# Patient Record
Sex: Female | Born: 1980 | Race: Black or African American | Hispanic: No | Marital: Single | Smoking: Former smoker
Health system: Southern US, Community
[De-identification: ages and names within clinical notes are randomized; demographics above are authoritative.]

## PROBLEM LIST (undated history)

## (undated) ENCOUNTER — Emergency Department (HOSPITAL_BASED_OUTPATIENT_CLINIC_OR_DEPARTMENT_OTHER): Admission: EM | Payer: Medicaid Other

## (undated) DIAGNOSIS — J45909 Unspecified asthma, uncomplicated: Secondary | ICD-10-CM

## (undated) DIAGNOSIS — I10 Essential (primary) hypertension: Secondary | ICD-10-CM

## (undated) HISTORY — PX: OTHER SURGICAL HISTORY: SHX169

---

## 2016-10-18 ENCOUNTER — Ambulatory Visit
Admission: EM | Admit: 2016-10-18 | Discharge: 2016-10-18 | Disposition: A | Discharge status: Home / Self Care | Payer: Self-pay | Attending: Family Medicine | Admitting: Family Medicine

## 2016-10-18 ENCOUNTER — Encounter: Payer: Self-pay | Admitting: Emergency Medicine

## 2016-10-18 DIAGNOSIS — J4520 Mild intermittent asthma, uncomplicated: Secondary | ICD-10-CM

## 2016-10-18 DIAGNOSIS — N3001 Acute cystitis with hematuria: Secondary | ICD-10-CM

## 2016-10-18 HISTORY — DX: Essential (primary) hypertension: I10

## 2016-10-18 HISTORY — DX: Unspecified asthma, uncomplicated: J45.909

## 2016-10-18 LAB — URINALYSIS, COMPLETE (UACMP) WITH MICROSCOPIC
Bilirubin Urine: NEGATIVE
GLUCOSE, UA: NEGATIVE mg/dL
KETONES UR: NEGATIVE mg/dL
NITRITE: NEGATIVE
PH: 6.5 (ref 5.0–8.0)
Protein, ur: 100 mg/dL — AB
Specific Gravity, Urine: 1.025 (ref 1.005–1.030)

## 2016-10-18 MED ORDER — FLUCONAZOLE 150 MG PO TABS: ORAL_TABLET | ORAL | 0 refills | Status: DC

## 2016-10-18 MED ORDER — CEPHALEXIN 500 MG PO CAPS: 500.0000 mg | ORAL_CAPSULE | Freq: Two times a day (BID) | ORAL | 0 refills | Status: DC

## 2016-10-18 MED ORDER — ALBUTEROL SULFATE 1.25 MG/3ML IN NEBU: 1.0000 | INHALATION_SOLUTION | Freq: Four times a day (QID) | RESPIRATORY_TRACT | 0 refills | Status: DC | PRN

## 2016-10-18 MED ORDER — ALBUTEROL SULFATE HFA 108 (90 BASE) MCG/ACT IN AERS: 1.0000 | INHALATION_SPRAY | Freq: Four times a day (QID) | RESPIRATORY_TRACT | 0 refills | Status: DC | PRN

## 2016-10-18 NOTE — ED Triage Notes (Signed)
Patient states she needs albuterol for her nebulizer and she thinks she may have a UTI

## 2016-10-18 NOTE — ED Provider Notes (Signed)
[facility]    CSN: [id number] Arrival date & time: 10/18/16  1018     History   Chief Complaint Chief Complaint  Patient presents with  . Asthma    HPI Roberta Soto is a 36 y.o. female.   HPI  This a 36 year old female who has a history of asthma. She states that she has run out of her albuterol nebulizer and albuterol inhaler. In addition she states that she thinks that she may have a UTI. She is currently "looking" for her primary care physician. He says that her asthma is typically worse at nighttime when it gets hot. She has mild wheezing currently. She has had no fever or chills. Does not complain of any back pain or flank pain. She does have frequency urgency and dysuria. She has not noticed any vaginal discharge or irritation.         Past Medical History:  Diagnosis Date  . Asthma   . Hypertension     There are no active problems to display for this patient.   Past Surgical History:  Procedure Laterality Date  . c-section     X4    OB History    No data available       Home Medications    Prior to Admission medications   Medication Sig Start Date End Date Taking? Authorizing Provider  escitalopram (LEXAPRO) 10 MG tablet Take 10 mg by mouth daily.   Yes [provider]  albuterol (ACCUNEB) 1.25 MG/3ML nebulizer solution Take 3 mLs (1.25 mg total) by nebulization every 6 (six) hours as needed for wheezing. 10/18/16   Lutricia Feil, PA-C  albuterol (PROVENTIL HFA;VENTOLIN HFA) 108 (90 Base) MCG/ACT inhaler Inhale 1-2 puffs into the lungs every 6 (six) hours as needed for wheezing or shortness of breath. Use with spacer 10/18/16   Lutricia Feil, PA-C  cephALEXin (KEFLEX) 500 MG capsule Take 1 capsule (500 mg total) by mouth 2 (two) times daily. 10/18/16   Lutricia Feil, PA-C  fluconazole (DIFLUCAN) 150 MG tablet Take one tab for symptoms of yeast infection. Repeat x 1 in 72 hours. 10/18/16   Lutricia Feil, PA-C    Family  History Family History  Problem Relation Age of Onset  . Cancer Mother   . Hypertension Mother   . Cancer Brother   . Healthy Father     Social History Social History  Substance Use Topics  . Smoking status: Never Smoker  . Smokeless tobacco: Never Used  . Alcohol use No     Allergies   Fruit & vegetable daily [nutritional supplements]   Review of Systems Review of Systems  Constitutional: Positive for activity change. Negative for appetite change, chills and fever.  Respiratory: Positive for shortness of breath and wheezing.   Genitourinary: Positive for frequency and urgency. Negative for vaginal discharge and vaginal pain.  Musculoskeletal: Negative for back pain.  All other systems reviewed and are negative.    Physical Exam Triage Vital Signs ED Triage Vitals  Enc Vitals Group     BP 10/18/16 1243 (!) 131/95     Pulse Rate 10/18/16 1243 78     Resp 10/18/16 1243 18     Temp 10/18/16 1243 98.3 F (36.8 C)     Temp Source 10/18/16 1243 Oral     SpO2 10/18/16 1243 99 %     Weight 10/18/16 1238 172 lb (78 kg)     Height 10/18/16 1238  (1.702 m)  Head Circumference --      Peak Flow --      Pain Score 10/18/16 1239 5     Pain Loc --      Pain Edu? --      Excl. in GC? --    No data found.   Updated Vital Signs BP (!) 131/95 (BP Location: Left Arm)   Pulse 78   Temp 98.3 F (36.8 C) (Oral)   Resp 18   Ht 5\' 7"  (1.702 m)   Wt 172 lb (78 kg)   LMP 09/30/2016 (Exact Date)   SpO2 99%   BMI 26.94 kg/m   Visual Acuity Right Eye Distance:   Left Eye Distance:   Bilateral Distance:    Right Eye Near:   Left Eye Near:    Bilateral Near:     Physical Exam  Constitutional: She is oriented to person, place, and time. She appears well-developed and well-nourished. No distress.  HENT:  Head: Normocephalic.  Eyes: Pupils are equal, round, and reactive to light.  Neck: Normal range of motion.  Pulmonary/Chest: Effort normal. She has wheezes.    Using is mild expiratory not interfering with air movement.  Abdominal: Soft. Bowel sounds are normal.  No CVA tenderness  Musculoskeletal: Normal range of motion.  Neurological: She is alert and oriented to person, place, and time.  Skin: Skin is warm and dry. She is not diaphoretic.  Psychiatric: She has a normal mood and affect. Her behavior is normal. Judgment and thought content normal.  Nursing note and vitals reviewed.    UC Treatments / Results  Labs (all labs ordered are listed, but only abnormal results are displayed) Labs Reviewed  URINALYSIS, COMPLETE (UACMP) WITH MICROSCOPIC - Abnormal; Notable for the following:       Result Value   APPearance CLOUDY (*)    Hgb urine dipstick MODERATE (*)    Protein, ur 100 (*)    Leukocytes, UA MODERATE (*)    Squamous Epithelial / LPF 0-5 (*)    Bacteria, UA FEW (*)    All other components within normal limits  URINE CULTURE    EKG  EKG Interpretation None       Radiology No results found.  Procedures Procedures (including critical care time)  Medications Ordered in UC Medications - No data to display   Initial Impression / Assessment and Plan / UC Course  I have reviewed the triage vital signs and the nursing notes.  Pertinent labs & imaging results that were available during my care of the patient were reviewed by me and considered in my medical decision making (see chart for details).     Plan: 1. Test/x-ray results and diagnosis reviewed with patient 2. rx as per orders; risks, benefits, potential side effects reviewed with patient 3. Recommend supportive treatment with drinking plenty of fluids. I have refilled her nebulizer and albuterol inhalers. She should find a primary care physician that we'll monitor her asthma. At the present time it appears to be mild intermittent. Culture sensitivities of the urine should be available in 48 hours. I'll treat her empirically with cephalexin. I've also given her  Diflucan for the budding yeast in the urine. 4. F/u prn if symptoms worsen or don't improve   Final Clinical Impressions(s) / UC Diagnoses   Final diagnoses:  Mild intermittent asthma without complication  Acute cystitis with hematuria    New Prescriptions Discharge Medication List as of 10/18/2016  1:38 PM    START taking these  medications   Details  albuterol (PROVENTIL HFA;VENTOLIN HFA) 108 (90 Base) MCG/ACT inhaler Inhale 1-2 puffs into the lungs every 6 (six) hours as needed for wheezing or shortness of breath. Use with spacer, Starting Fri 10/18/2016, Normal    cephALEXin (KEFLEX) 500 MG capsule Take 1 capsule (500 mg total) by mouth 2 (two) times daily., Starting Fri 10/18/2016, Normal    fluconazole (DIFLUCAN) 150 MG tablet Take one tab for symptoms of yeast infection. Repeat x 1 in 72 hours., Normal         Controlled Substance Prescriptions [place] Controlled Substance Registry consulted? Not Applicable   Lutricia Feil, PA-C 10/18/16 1344

## 2016-10-21 LAB — URINE CULTURE: Culture: >=100000 — AB

## 2018-06-23 ENCOUNTER — Encounter: Payer: Self-pay | Admitting: Emergency Medicine

## 2018-06-23 ENCOUNTER — Other Ambulatory Visit: Payer: Self-pay

## 2018-06-23 ENCOUNTER — Ambulatory Visit
Admission: EM | Admit: 2018-06-23 | Discharge: 2018-06-23 | Disposition: A | Discharge status: Home / Self Care | Payer: Self-pay | Attending: Family Medicine | Admitting: Family Medicine

## 2018-06-23 DIAGNOSIS — J45998 Other asthma: Secondary | ICD-10-CM

## 2018-06-23 DIAGNOSIS — R05 Cough: Secondary | ICD-10-CM

## 2018-06-23 DIAGNOSIS — J4541 Moderate persistent asthma with (acute) exacerbation: Secondary | ICD-10-CM

## 2018-06-23 DIAGNOSIS — R053 Chronic cough: Secondary | ICD-10-CM

## 2018-06-23 DIAGNOSIS — I10 Essential (primary) hypertension: Secondary | ICD-10-CM

## 2018-06-23 MED ORDER — ALBUTEROL SULFATE HFA 108 (90 BASE) MCG/ACT IN AERS: 1.0000 | INHALATION_SPRAY | Freq: Four times a day (QID) | RESPIRATORY_TRACT | 11 refills | Status: DC | PRN

## 2018-06-23 MED ORDER — BUDESONIDE-FORMOTEROL FUMARATE 80-4.5 MCG/ACT IN AERO: 2.0000 | INHALATION_SPRAY | Freq: Two times a day (BID) | RESPIRATORY_TRACT | 12 refills | Status: DC

## 2018-06-23 MED ORDER — BENZONATATE 200 MG PO CAPS: 200.0000 mg | ORAL_CAPSULE | Freq: Three times a day (TID) | ORAL | 0 refills | Status: DC | PRN

## 2018-06-23 MED ORDER — PREDNISONE 50 MG PO TABS: ORAL_TABLET | ORAL | 0 refills | Status: DC

## 2018-06-23 NOTE — ED Provider Notes (Signed)
[facility]    CSN: [id number] Arrival date & time: 06/23/18  1050   History   Chief Complaint Chief Complaint  Patient presents with  . Cough  . Shortness of Breath   HPI  38 year old female presents with cough, wheezing, shortness of breath.  Patient reports that she has had an ongoing cough for greater than 6 months.  Worsened as of late.  She has been more short of breath since Friday.  Cough is productive.  She reports that she has not had any recent travel.  No fever.  She states that she is using her inhaler greater than 10 times a day.  She feels like work may exacerbate her condition due to dust.  She has used her inhaler without resolution.  Patient has recently missed work due to her cough.  No other associated symptoms.  No other complaints.   History reviewed as below. Past Medical History:  Diagnosis Date  . Asthma   . Hypertension    Past Surgical History:  Procedure Laterality Date  . c-section     X4   OB History   No obstetric history on file.    Home Medications    Prior to Admission medications   Medication Sig Start Date End Date Taking? Authorizing Provider  albuterol (ACCUNEB) 1.25 MG/3ML nebulizer solution Take 3 mLs (1.25 mg total) by nebulization every 6 (six) hours as needed for wheezing. 10/18/16  Yes Lutricia Feil, PA-C  escitalopram (LEXAPRO) 10 MG tablet Take 10 mg by mouth daily.   Yes [provider]  albuterol (VENTOLIN HFA) 108 (90 Base) MCG/ACT inhaler Inhale 1-2 puffs into the lungs every 6 (six) hours as needed for wheezing or shortness of breath. Use with spacer 06/23/18   Nao Linz, Dorie Rank G, DO  benzonatate (TESSALON) 200 MG capsule Take 1 capsule (200 mg total) by mouth 3 (three) times daily as needed for cough. 06/23/18   Tommie Sams, DO  budesonide-formoterol (SYMBICORT) 80-4.5 MCG/ACT inhaler Inhale 2 puffs into the lungs 2 (two) times a day. 06/23/18   Tommie Sams, DO  predniSONE (DELTASONE) 50 MG tablet 1  tablet daily x 5 days 06/23/18   Tommie Sams, DO    Family History Family History  Problem Relation Age of Onset  . Cancer Mother   . Hypertension Mother   . Cancer Brother   . Healthy Father     Social History Social History   Tobacco Use  . Smoking status: Never Smoker  . Smokeless tobacco: Never Used  Substance Use Topics  . Alcohol use: No  . Drug use: Never     Allergies   Fruit & vegetable daily [nutritional supplements]   Review of Systems Review of Systems  Constitutional: Negative for fever.  Respiratory: Positive for cough and shortness of breath.    Physical Exam Triage Vital Signs ED Triage Vitals  Enc Vitals Group     BP 06/23/18 1126 (!) 132/95     Pulse Rate 06/23/18 1126 70     Resp 06/23/18 1126 16     Temp 06/23/18 1126 98.2 F (36.8 C)     Temp Source 06/23/18 1126 Oral     SpO2 06/23/18 1126 99 %     Weight 06/23/18 1122 200 lb (90.7 kg)     Height 06/23/18 1122 5\' 7"  (1.702 m)     Head Circumference --      Peak Flow --      Pain Score 06/23/18  1122 0     Pain Loc --      Pain Edu? --      Excl. in GC? --    Updated Vital Signs BP (!) 132/95 (BP Location: Left Arm)   Pulse 70   Temp 98.2 F (36.8 C) (Oral)   Resp 16   Ht 5\' 7"  (1.702 m)   Wt 90.7 kg   LMP 06/03/2018 (Exact Date)   SpO2 99%   BMI 31.32 kg/m   Visual Acuity Right Eye Distance:   Left Eye Distance:   Bilateral Distance:    Right Eye Near:   Left Eye Near:    Bilateral Near:     Physical Exam Vitals signs and nursing note reviewed.  Constitutional:      General: She is not in acute distress.    Appearance: Normal appearance.  HENT:     Head: Normocephalic and atraumatic.  Eyes:     General:        Right eye: No discharge.        Left eye: No discharge.     Conjunctiva/sclera: Conjunctivae normal.  Cardiovascular:     Rate and Rhythm: Normal rate and regular rhythm.  Pulmonary:     Effort: Pulmonary effort is normal.     Comments: Diffuse  expiratory wheezing. Neurological:     Mental Status: She is alert.  Psychiatric:        Mood and Affect: Mood normal.        Behavior: Behavior normal.    UC Treatments / Results  Labs (all labs ordered are listed, but only abnormal results are displayed) Labs Reviewed - No data to display  EKG None  Radiology No results found.  Procedures Procedures (including critical care time)  Medications Ordered in UC Medications - No data to display  Initial Impression / Assessment and Plan / UC Course  I have reviewed the triage vital signs and the nursing notes.  Pertinent labs & imaging results that were available during my care of the patient were reviewed by me and considered in my medical decision making (see chart for details).    38 year old female presents with uncontrolled asthma.  Current exacerbation.  Ongoing chronic cough.  Treating with prednisone.  Tessalon Perles for cough.  Starting on controller medication: Symbicort.  Albuterol as needed.  Work note given.  Final Clinical Impressions(s) / UC Diagnoses   Final diagnoses:  Moderate persistent asthma with exacerbation  Uncontrolled persistent asthma  Chronic cough     Discharge Instructions     Medications as prescribed.  Take care  Dr. Adriana Simasook    ED Prescriptions    Medication Sig Dispense Auth. Provider   albuterol (VENTOLIN HFA) 108 (90 Base) MCG/ACT inhaler Inhale 1-2 puffs into the lungs every 6 (six) hours as needed for wheezing or shortness of breath. Use with spacer 2 Inhaler Jsean Taussig G, DO   budesonide-formoterol (SYMBICORT) 80-4.5 MCG/ACT inhaler Inhale 2 puffs into the lungs 2 (two) times a day. 1 Inhaler Shandora Koogler G, DO   predniSONE (DELTASONE) 50 MG tablet 1 tablet daily x 5 days 5 tablet Tasheba Henson G, DO   benzonatate (TESSALON) 200 MG capsule Take 1 capsule (200 mg total) by mouth 3 (three) times daily as needed for cough. 30 capsule Tommie Samsook, Shaye Lagace G, DO     Controlled Substance  Prescriptions [place] Controlled Substance Registry consulted? Not Applicable   Tommie SamsCook, Liliani Bobo G, DO 06/23/18 1205

## 2018-06-23 NOTE — Discharge Instructions (Addendum)
Medications as prescribed. ° °Take care ° °Dr. Denario Bagot  °

## 2018-06-23 NOTE — ED Triage Notes (Signed)
Patient c/o cough for over a month.  Patient also reports SOB that started last Friday.  Patient states that she has asthma. Patient denies fevers.

## 2018-09-20 ENCOUNTER — Ambulatory Visit
Admission: EM | Admit: 2018-09-20 | Discharge: 2018-09-20 | Disposition: A | Discharge status: Home / Self Care | Payer: Self-pay | Attending: Family Medicine | Admitting: Family Medicine

## 2018-09-20 ENCOUNTER — Other Ambulatory Visit: Payer: Self-pay

## 2018-09-20 DIAGNOSIS — R112 Nausea with vomiting, unspecified: Secondary | ICD-10-CM

## 2018-09-20 LAB — COMPREHENSIVE METABOLIC PANEL
ALT: 19 U/L (ref 0–44)
AST: 25 U/L (ref 15–41)
Albumin: 4.5 g/dL (ref 3.5–5.0)
Alkaline Phosphatase: 66 U/L (ref 38–126)
Anion gap: 10 (ref 5–15)
BUN: 9 mg/dL (ref 6–20)
CO2: 21 mmol/L — ABNORMAL LOW (ref 22–32)
Calcium: 9.1 mg/dL (ref 8.9–10.3)
Chloride: 108 mmol/L (ref 98–111)
Creatinine, Ser: 0.95 mg/dL (ref 0.44–1.00)
GFR calc Af Amer: >60 mL/min (ref >60)
GFR calc non Af Amer: >60 mL/min (ref >60)
Glucose, Bld: 105 mg/dL — ABNORMAL HIGH (ref 70–99)
Potassium: 3.4 mmol/L — ABNORMAL LOW (ref 3.5–5.1)
Sodium: 139 mmol/L (ref 135–145)
Total Bilirubin: 0.9 mg/dL (ref 0.3–1.2)
Total Protein: 7.6 g/dL (ref 6.5–8.1)

## 2018-09-20 LAB — URINALYSIS, COMPLETE (UACMP) WITH MICROSCOPIC
Glucose, UA: NEGATIVE mg/dL
Ketones, ur: 15 mg/dL — AB
Leukocytes,Ua: NEGATIVE
Nitrite: NEGATIVE
Protein, ur: 30 mg/dL — AB
Specific Gravity, Urine: >1.03 — ABNORMAL HIGH (ref 1.005–1.030)
pH: 5.5 (ref 5.0–8.0)

## 2018-09-20 LAB — PREGNANCY, URINE: Preg Test, Ur: NEGATIVE

## 2018-09-20 MED ORDER — PANTOPRAZOLE SODIUM 40 MG PO TBEC: 40.0000 mg | DELAYED_RELEASE_TABLET | Freq: Every day | ORAL | 0 refills | Status: DC

## 2018-09-20 MED ORDER — ONDANSETRON HCL 4 MG PO TABS: 4.0000 mg | ORAL_TABLET | Freq: Three times a day (TID) | ORAL | 0 refills | Status: DC | PRN

## 2018-09-20 NOTE — ED Triage Notes (Addendum)
Pt states she was seen 2 weeks ago at [facility] for food poisoning and still having symptoms. Having nausea and vomiting foam only in the morning. States she is not pregnant her tubes are tied. Thinks maybe it's acid reflux. But has stopped taking her lexapro since she has been sick and did not titrate off it.

## 2018-09-20 NOTE — ED Provider Notes (Signed)
[facility]    CSN: [id number] Arrival date & time: 09/20/18  1026      History   Chief Complaint Chief Complaint  Patient presents with  . Abdominal Pain   HPI  38 year old female presents with nausea, and vomiting.  She also reports some associated abdominal pain.  Patient was seen at another urgent care on 7/29.  Patient states that this was thought to be viral/food poisoning.  She was treated with Zofran.  She states that since that time she continues to have difficulty.  Reports nausea and emesis in the morning.  She states that she still not eating normally.  She is worried that if she eats a normal meal she will improve up.  Denies abdominal pain at this time.  No fever.  She is tolerating fluids.  Denies possibility of being pregnant as she had a tubal ligation.  Worse in the a.m.  Better throughout the day.  No other associated symptoms.  No other complaints.  PMH, Surgical Hx, Family Hx, Social History reviewed and updated as below.  Past Medical History:  Diagnosis Date  . Asthma   . Hypertension    Past Surgical History:  Procedure Laterality Date  . c-section     X4  Tubal ligation  OB History   No obstetric history on file.     Home Medications    Prior to Admission medications   Medication Sig Start Date End Date Taking? Authorizing Provider  albuterol (ACCUNEB) 1.25 MG/3ML nebulizer solution Take 3 mLs (1.25 mg total) by nebulization every 6 (six) hours as needed for wheezing. 10/18/16   Lorin Picket, PA-C  albuterol (VENTOLIN HFA) 108 (90 Base) MCG/ACT inhaler Inhale 1-2 puffs into the lungs every 6 (six) hours as needed for wheezing or shortness of breath. Use with spacer 06/23/18   Dyasia Firestine, Michael Boston G, DO  benzonatate (TESSALON) 200 MG capsule Take 1 capsule (200 mg total) by mouth 3 (three) times daily as needed for cough. 06/23/18   Coral Spikes, DO  budesonide-formoterol (SYMBICORT) 80-4.5 MCG/ACT inhaler Inhale 2 puffs into the lungs 2 (two)  times a day. 06/23/18   Coral Spikes, DO  escitalopram (LEXAPRO) 10 MG tablet Take 10 mg by mouth daily.    [provider]  ondansetron (ZOFRAN) 4 MG tablet Take 1 tablet (4 mg total) by mouth every 8 (eight) hours as needed for nausea or vomiting. 09/20/18   Coral Spikes, DO  pantoprazole (PROTONIX) 40 MG tablet Take 1 tablet (40 mg total) by mouth daily. 09/20/18   Coral Spikes, DO  predniSONE (DELTASONE) 50 MG tablet 1 tablet daily x 5 days 06/23/18   Coral Spikes, DO    Family History Family History  Problem Relation Age of Onset  . Cancer Mother   . Hypertension Mother   . Cancer Brother   . Healthy Father     Social History Social History   Tobacco Use  . Smoking status: Never Smoker  . Smokeless tobacco: Never Used  Substance Use Topics  . Alcohol use: No  . Drug use: Never     Allergies   Fruit & vegetable daily [nutritional supplements] and Latex   Review of Systems Review of Systems  Constitutional: Positive for appetite change. Negative for fever.  Gastrointestinal: Positive for abdominal pain, nausea and vomiting.   Physical Exam Triage Vital Signs ED Triage Vitals [09/20/18 1102]  Enc Vitals Group     BP 116/87  Pulse Rate 88     Resp 18     Temp 98 F (36.7 C)     Temp Source Oral     SpO2 99 %     Weight 206 lb (93.4 kg)     Height      Head Circumference      Peak Flow      Pain Score 0     Pain Loc      Pain Edu?      Excl. in GC?    Updated Vital Signs BP 116/87 (BP Location: Left Arm)   Pulse 88   Temp 98 F (36.7 C) (Oral)   Resp 18   Wt 93.4 kg   LMP  (Exact Date) Comment: tubal ligation  SpO2 99%   BMI 32.26 kg/m   Visual Acuity Right Eye Distance:   Left Eye Distance:   Bilateral Distance:    Right Eye Near:   Left Eye Near:    Bilateral Near:     Physical Exam Vitals signs and nursing note reviewed.  Constitutional:      General: She is not in acute distress.    Appearance: She is well-developed. She  is obese.  HENT:     Head: Normocephalic and atraumatic.  Eyes:     General:        Right eye: No discharge.        Left eye: No discharge.     Conjunctiva/sclera: Conjunctivae normal.  Cardiovascular:     Rate and Rhythm: Normal rate and regular rhythm.  Pulmonary:     Effort: Pulmonary effort is normal.     Breath sounds: Normal breath sounds. No wheezing, rhonchi or rales.  Abdominal:     General: There is no distension.     Palpations: Abdomen is soft.     Tenderness: There is no abdominal tenderness.  Neurological:     Mental Status: She is alert.  Psychiatric:        Mood and Affect: Mood normal.        Behavior: Behavior normal.    UC Treatments / Results  Labs (all labs ordered are listed, but only abnormal results are displayed) Labs Reviewed  URINALYSIS, COMPLETE (UACMP) WITH MICROSCOPIC - Abnormal; Notable for the following components:      Result Value   Color, Urine AMBER (*)    APPearance CLOUDY (*)    Specific Gravity, Urine >1.030 (*)    Hgb urine dipstick MODERATE (*)    Bilirubin Urine MODERATE (*)    Ketones, ur 15 (*)    Protein, ur 30 (*)    Bacteria, UA RARE (*)    All other components within normal limits  COMPREHENSIVE METABOLIC PANEL - Abnormal; Notable for the following components:   Potassium 3.4 (*)    CO2 21 (*)    Glucose, Bld 105 (*)    All other components within normal limits  PREGNANCY, URINE    EKG   Radiology No results found.  Procedures Procedures (including critical care time)  Medications Ordered in UC Medications - No data to display  Initial Impression / Assessment and Plan / UC Course  I have reviewed the triage vital signs and the nursing notes.  Pertinent labs & imaging results that were available during my care of the patient were reviewed by me and considered in my medical decision making (see chart for details).    38 year old female presents with nausea and vomiting.  Labs unremarkable.  Pregnancy  test  negative.  Treating with Protonix and Zofran.  Supportive care.  Final Clinical Impressions(s) / UC Diagnoses   Final diagnoses:  Non-intractable vomiting with nausea, unspecified vomiting type     Discharge Instructions     Medication as directed.  Try eating some of your normal foods.  Take care  Dr. Adriana Simasook    ED Prescriptions    Medication Sig Dispense Auth. Provider   ondansetron (ZOFRAN) 4 MG tablet Take 1 tablet (4 mg total) by mouth every 8 (eight) hours as needed for nausea or vomiting. 20 tablet Akiva Brassfield G, DO   pantoprazole (PROTONIX) 40 MG tablet Take 1 tablet (40 mg total) by mouth daily. 30 tablet Tommie Samsook, Hollin Crewe G, DO     Controlled Substance Prescriptions [place] Controlled Substance Registry consulted? Not Applicable   Tommie SamsCook, Haely Leyland G, DO 09/20/18 1216

## 2018-09-20 NOTE — Discharge Instructions (Signed)
Medication as directed.  Try eating some of your normal foods.  Take care  Dr. Lacinda Axon

## 2018-10-28 ENCOUNTER — Ambulatory Visit
Admission: EM | Admit: 2018-10-28 | Discharge: 2018-10-28 | Disposition: A | Discharge status: Home / Self Care | Payer: Self-pay | Attending: Family Medicine | Admitting: Family Medicine

## 2018-10-28 ENCOUNTER — Other Ambulatory Visit: Payer: Self-pay

## 2018-10-28 ENCOUNTER — Encounter: Payer: Self-pay | Admitting: Emergency Medicine

## 2018-10-28 DIAGNOSIS — J4541 Moderate persistent asthma with (acute) exacerbation: Secondary | ICD-10-CM

## 2018-10-28 MED ORDER — PREDNISONE 50 MG PO TABS: ORAL_TABLET | ORAL | 0 refills | Status: DC

## 2018-10-28 NOTE — ED Provider Notes (Signed)
[facility]    CSN: [id number] Arrival date & time: 10/28/18  1059  History   Chief Complaint Chief Complaint  Patient presents with  . Cough  . Fatigue   HPI  38 year old female presents with the above complaints.  Patient reports that she has had cough and fatigue for the past 2 to 3 days.  No pain.  No sore throat.  Patient endorses compliance with her inhalers.  Last albuterol use was last night.  Patient endorses wheezing.  She states that her son has recently had a cold.  She believes that this is the inciting factor for her symptoms.  She does want COVID testing is to be conservative.  No relieving factors.  No other associated symptoms.  No other complaints at this time.  PMH, Surgical Hx, Family Hx, Social History reviewed and updated as below.  Past Medical History:  Diagnosis Date  . Asthma   . Hypertension    Past Surgical History:  Procedure Laterality Date  . c-section     X4    OB History   No obstetric history on file.      Home Medications    Prior to Admission medications   Medication Sig Start Date End Date Taking? Authorizing Provider  albuterol (ACCUNEB) 1.25 MG/3ML nebulizer solution Take 3 mLs (1.25 mg total) by nebulization every 6 (six) hours as needed for wheezing. 10/18/16  Yes Lutricia Feiloemer, William P, PA-C  albuterol (VENTOLIN HFA) 108 (90 Base) MCG/ACT inhaler Inhale 1-2 puffs into the lungs every 6 (six) hours as needed for wheezing or shortness of breath. Use with spacer 06/23/18  Yes Tifani Dack G, DO  budesonide-formoterol (SYMBICORT) 80-4.5 MCG/ACT inhaler Inhale 2 puffs into the lungs 2 (two) times a day. 06/23/18  Yes Amaiah Cristiano G, DO  escitalopram (LEXAPRO) 10 MG tablet Take 10 mg by mouth daily.   Yes [provider]  ondansetron (ZOFRAN) 4 MG tablet Take 1 tablet (4 mg total) by mouth every 8 (eight) hours as needed for nausea or vomiting. 09/20/18  Yes Lonya Johannesen G, DO  pantoprazole (PROTONIX) 40 MG tablet Take 1 tablet  (40 mg total) by mouth daily. 09/20/18  Yes Eloise Mula G, DO  predniSONE (DELTASONE) 50 MG tablet 1 tablet daily x 5 days 10/28/18   Tommie Samsook, Ronell Boldin G, DO    Family History Family History  Problem Relation Age of Onset  . Cancer Mother   . Hypertension Mother   . Cancer Brother   . Healthy Father     Social History Social History   Tobacco Use  . Smoking status: Never Smoker  . Smokeless tobacco: Never Used  Substance Use Topics  . Alcohol use: No  . Drug use: Never     Allergies   Fruit & vegetable daily [nutritional supplements] and Latex   Review of Systems Review of Systems  Constitutional: Positive for fatigue.  Respiratory: Positive for cough and wheezing.    Physical Exam Triage Vital Signs ED Triage Vitals [10/28/18 1112]  Enc Vitals Group     BP (!) 135/93     Pulse Rate 86     Resp 18     Temp 98.6 F (37 C)     Temp Source Oral     SpO2 97 %     Weight 205 lb (93 kg)     Height 5\' 7"  (1.702 m)     Head Circumference      Peak Flow  Pain Score 0     Pain Loc      Pain Edu?      Excl. in [place]?    Updated Vital Signs BP (!) 135/93 (BP Location: Left Arm)   Pulse 86   Temp 98.6 F (37 C) (Oral)   Resp 18   Ht 5\' 7"  (1.702 m)   Wt 93 kg   LMP 10/18/2018 (Exact Date)   SpO2 97%   BMI 32.11 kg/m   Visual Acuity Right Eye Distance:   Left Eye Distance:   Bilateral Distance:    Right Eye Near:   Left Eye Near:    Bilateral Near:     Physical Exam Vitals signs and nursing note reviewed.  Constitutional:      General: She is not in acute distress.    Appearance: Normal appearance. She is not ill-appearing.  HENT:     Head: Normocephalic and atraumatic.  Eyes:     General:        Right eye: No discharge.        Left eye: No discharge.     Conjunctiva/sclera: Conjunctivae normal.  Cardiovascular:     Rate and Rhythm: Normal rate and regular rhythm.  Pulmonary:     Effort: Pulmonary effort is normal.     Comments: Diffuse expiratory  wheezing.  Prolonged expiratory phase. Skin:    General: Skin is warm.     Findings: No rash.  Neurological:     Mental Status: She is alert.  Psychiatric:        Mood and Affect: Mood normal.        Behavior: Behavior normal.    UC Treatments / Results  Labs (all labs ordered are listed, but only abnormal results are displayed) Labs Reviewed  NOVEL CORONAVIRUS, NAA (HOSP ORDER, SEND-OUT TO REF LAB; TAT 18-24 HRS)    EKG   Radiology No results found.  Procedures Procedures (including critical care time)  Medications Ordered in UC Medications - No data to display  Initial Impression / Assessment and Plan / UC Course  I have reviewed the triage vital signs and the nursing notes.  Pertinent labs & imaging results that were available during my care of the patient were reviewed by me and considered in my medical decision making (see chart for details).    38 year old female presents with asthma exacerbation.  Prednisone as prescribed.  Awaiting COVID testing.  Final Clinical Impressions(s) / UC Diagnoses   Final diagnoses:  Moderate persistent asthma with acute exacerbation     Discharge Instructions     Inhalers as prescribed.  Prednisone as prescribed.  Take care  Dr. Lacinda Axon    ED Prescriptions    Medication Sig Dispense Auth. Provider   predniSONE (DELTASONE) 50 MG tablet 1 tablet daily x 5 days 5 tablet Coral Spikes, DO     Controlled Substance Prescriptions [place] Controlled Substance Registry consulted? Not Applicable   Coral Spikes, DO 10/28/18 1216

## 2018-10-28 NOTE — ED Triage Notes (Signed)
Patient in today c/o cough and fatigue x 2-3 days. Patient denies fever, sore throat.

## 2018-10-28 NOTE — Discharge Instructions (Signed)
Inhalers as prescribed.  Prednisone as prescribed.  Take care  Dr. Lacinda Axon

## 2018-10-29 LAB — NOVEL CORONAVIRUS, NAA (HOSP ORDER, SEND-OUT TO REF LAB; TAT 18-24 HRS): SARS-CoV-2, NAA: NOT DETECTED

## 2019-08-13 ENCOUNTER — Emergency Department
Admission: EM | Admit: 2019-08-13 | Discharge: 2019-08-14 | Disposition: A | Discharge status: Home / Self Care | Payer: Medicaid Other | Attending: Emergency Medicine | Admitting: Emergency Medicine

## 2019-08-13 ENCOUNTER — Other Ambulatory Visit: Payer: Self-pay

## 2019-08-13 ENCOUNTER — Ambulatory Visit (INDEPENDENT_AMBULATORY_CARE_PROVIDER_SITE_OTHER): Payer: Medicaid Other

## 2019-08-13 ENCOUNTER — Encounter: Payer: Self-pay | Admitting: Emergency Medicine

## 2019-08-13 ENCOUNTER — Ambulatory Visit
Admission: EM | Admit: 2019-08-13 | Discharge: 2019-08-13 | Disposition: A | Discharge status: Home / Self Care | Payer: Medicaid Other | Attending: Family Medicine | Admitting: Family Medicine

## 2019-08-13 DIAGNOSIS — Z7951 Long term (current) use of inhaled steroids: Secondary | ICD-10-CM | POA: Insufficient documentation

## 2019-08-13 DIAGNOSIS — J4541 Moderate persistent asthma with (acute) exacerbation: Secondary | ICD-10-CM | POA: Diagnosis not present

## 2019-08-13 DIAGNOSIS — R059 Cough, unspecified: Secondary | ICD-10-CM

## 2019-08-13 DIAGNOSIS — R0602 Shortness of breath: Secondary | ICD-10-CM | POA: Diagnosis not present

## 2019-08-13 DIAGNOSIS — R05 Cough: Secondary | ICD-10-CM

## 2019-08-13 DIAGNOSIS — Z20822 Contact with and (suspected) exposure to covid-19: Secondary | ICD-10-CM | POA: Insufficient documentation

## 2019-08-13 DIAGNOSIS — J45901 Unspecified asthma with (acute) exacerbation: Secondary | ICD-10-CM | POA: Insufficient documentation

## 2019-08-13 DIAGNOSIS — Z91040 Latex allergy status: Secondary | ICD-10-CM | POA: Diagnosis not present

## 2019-08-13 DIAGNOSIS — I10 Essential (primary) hypertension: Secondary | ICD-10-CM | POA: Diagnosis not present

## 2019-08-13 DIAGNOSIS — R030 Elevated blood-pressure reading, without diagnosis of hypertension: Secondary | ICD-10-CM

## 2019-08-13 DIAGNOSIS — Z87891 Personal history of nicotine dependence: Secondary | ICD-10-CM | POA: Diagnosis not present

## 2019-08-13 LAB — CBC WITH DIFFERENTIAL/PLATELET
Abs Immature Granulocytes: 0.02 10*3/uL (ref 0.00–0.07)
Basophils Absolute: 0 10*3/uL (ref 0.0–0.1)
Basophils Relative: 0 %
Eosinophils Absolute: 0 10*3/uL (ref 0.0–0.5)
Eosinophils Relative: 0 %
HCT: 39.5 % (ref 36.0–46.0)
Hemoglobin: 12.4 g/dL (ref 12.0–15.0)
Immature Granulocytes: 0 %
Lymphocytes Relative: 5 %
Lymphs Abs: 0.5 10*3/uL — ABNORMAL LOW (ref 0.7–4.0)
MCH: 24.9 pg — ABNORMAL LOW (ref 26.0–34.0)
MCHC: 31.4 g/dL (ref 30.0–36.0)
MCV: 79.5 fL — ABNORMAL LOW (ref 80.0–100.0)
Monocytes Absolute: 0.1 10*3/uL (ref 0.1–1.0)
Monocytes Relative: 2 %
Neutro Abs: 8.3 10*3/uL — ABNORMAL HIGH (ref 1.7–7.7)
Neutrophils Relative %: 93 %
Platelets: 363 10*3/uL (ref 150–400)
RBC: 4.97 MIL/uL (ref 3.87–5.11)
RDW: 14.8 % (ref 11.5–15.5)
WBC: 8.9 10*3/uL (ref 4.0–10.5)
nRBC: 0.2 % (ref 0.0–0.2)

## 2019-08-13 LAB — COMPREHENSIVE METABOLIC PANEL
ALT: 21 U/L (ref 0–44)
AST: 31 U/L (ref 15–41)
Albumin: 4.5 g/dL (ref 3.5–5.0)
Alkaline Phosphatase: 78 U/L (ref 38–126)
Anion gap: 14 (ref 5–15)
BUN: <5 mg/dL — ABNORMAL LOW (ref 6–20)
CO2: 20 mmol/L — ABNORMAL LOW (ref 22–32)
Calcium: 9.4 mg/dL (ref 8.9–10.3)
Chloride: 105 mmol/L (ref 98–111)
Creatinine, Ser: 0.89 mg/dL (ref 0.44–1.00)
GFR calc Af Amer: >60 mL/min (ref >60)
GFR calc non Af Amer: >60 mL/min (ref >60)
Glucose, Bld: 129 mg/dL — ABNORMAL HIGH (ref 70–99)
Potassium: 3.3 mmol/L — ABNORMAL LOW (ref 3.5–5.1)
Sodium: 139 mmol/L (ref 135–145)
Total Bilirubin: 0.9 mg/dL (ref 0.3–1.2)
Total Protein: 8.3 g/dL — ABNORMAL HIGH (ref 6.5–8.1)

## 2019-08-13 LAB — MAGNESIUM: Magnesium: 1.8 mg/dL (ref 1.7–2.4)

## 2019-08-13 LAB — TROPONIN I (HIGH SENSITIVITY): Troponin I (High Sensitivity): 3 ng/L (ref <18)

## 2019-08-13 MED ORDER — ALBUTEROL SULFATE HFA 108 (90 BASE) MCG/ACT IN AERS: 2.0000 | INHALATION_SPRAY | RESPIRATORY_TRACT | 0 refills | Status: DC | PRN

## 2019-08-13 MED ORDER — AEROCHAMBER PLUS FLO-VU MISC: 1.0000 | Freq: Once | Status: DC

## 2019-08-13 MED ORDER — IPRATROPIUM-ALBUTEROL 0.5-2.5 (3) MG/3ML IN SOLN
3.0000 mL | Freq: Once | RESPIRATORY_TRACT | Status: AC
  Administered 2019-08-13: 3 mL via RESPIRATORY_TRACT
  Filled 2019-08-13: qty 6

## 2019-08-13 MED ORDER — BUDESONIDE-FORMOTEROL FUMARATE 160-4.5 MCG/ACT IN AERO: 2.0000 | INHALATION_SPRAY | Freq: Two times a day (BID) | RESPIRATORY_TRACT | 12 refills | Status: AC

## 2019-08-13 MED ORDER — IPRATROPIUM-ALBUTEROL 0.5-2.5 (3) MG/3ML IN SOLN: 3.0000 mL | RESPIRATORY_TRACT | 3 refills | Status: AC | PRN

## 2019-08-13 MED ORDER — MONTELUKAST SODIUM 10 MG PO TABS: 10.0000 mg | ORAL_TABLET | Freq: Every day | ORAL | 2 refills | Status: DC

## 2019-08-13 MED ORDER — LEVOCETIRIZINE DIHYDROCHLORIDE 5 MG PO TABS: 5.0000 mg | ORAL_TABLET | Freq: Every evening | ORAL | 3 refills | Status: AC

## 2019-08-13 MED ORDER — ALBUTEROL SULFATE HFA 108 (90 BASE) MCG/ACT IN AERS: 2.0000 | INHALATION_SPRAY | Freq: Once | RESPIRATORY_TRACT | Status: DC

## 2019-08-13 MED ORDER — SODIUM CHLORIDE 0.9 % IV BOLUS
500.0000 mL | Freq: Once | INTRAVENOUS | Status: AC
  Administered 2019-08-13: 500 mL via INTRAVENOUS

## 2019-08-13 MED ORDER — MAGNESIUM SULFATE 2 GM/50ML IV SOLN
2.0000 g | Freq: Once | INTRAVENOUS | Status: AC
  Administered 2019-08-13: 2 g via INTRAVENOUS
  Filled 2019-08-13: qty 50

## 2019-08-13 MED ORDER — DEXAMETHASONE SODIUM PHOSPHATE 10 MG/ML IJ SOLN
10.0000 mg | Freq: Once | INTRAMUSCULAR | Status: AC
  Administered 2019-08-13: 10 mg via INTRAMUSCULAR

## 2019-08-13 MED ORDER — PREDNISONE 10 MG (21) PO TBPK: ORAL_TABLET | Freq: Every day | ORAL | 0 refills | Status: DC

## 2019-08-13 MED ORDER — IPRATROPIUM-ALBUTEROL 0.5-2.5 (3) MG/3ML IN SOLN
3.0000 mL | Freq: Once | RESPIRATORY_TRACT | Status: AC
  Administered 2019-08-13: 3 mL via RESPIRATORY_TRACT

## 2019-08-13 NOTE — Discharge Instructions (Addendum)
I have sent in Xyzal, Singulair, albuterol, Symbicort, and a prednisone taper for you to take  Chest x-ray was negative  Take the prednisone as follows:  Take 6 tabs by mouth daily  for 2 days, then 5 tabs for 2 days, then 4 tabs for 2 days, then 3 tabs for 2 days, 2 tabs for 2 days, then 1 tab by mouth daily for 2 days  You've also received a steroid injection in the office today  If you are not feeling better later on today, you need to go to the ER for further evaluation and treatment.

## 2019-08-13 NOTE — ED Triage Notes (Signed)
Patient reports seen at urgent care earlier today for same symptoms, reports not feeling any better.

## 2019-08-13 NOTE — ED Provider Notes (Signed)
[facility] Emergency Department Provider Note ____________________________________________   First MD Initiated Contact with Patient 08/13/19 2210     (approximate)  I have reviewed the triage vital signs and the nursing notes.   HISTORY  Chief Complaint Shortness of Breath and Asthma    HPI Roberta Soto is a 39 y.o. female with PMH as noted below including a history of asthma who presents with shortness of breath, gradual onset over at least several days, and similar to prior asthma attacks.  The patient reports that she has been using her son's albuterol with no relief.  She was seen at urgent care earlier today, got a shot of steroid and was prescribed prednisone and an albuterol inhaler.  She states that she used both of these, but still is not feeling well.  She reports a nonproductive cough but no fever.  She also has some left arm pain but no chest pain.  Past Medical History:  Diagnosis Date  . Asthma   . Hypertension     There are no problems to display for this patient.   Past Surgical History:  Procedure Laterality Date  . c-section     X4    Prior to Admission medications   Medication Sig Start Date End Date Taking? Authorizing Provider  albuterol (VENTOLIN HFA) 108 (90 Base) MCG/ACT inhaler Inhale 2 puffs into the lungs every 4 (four) hours as needed for wheezing or shortness of breath. 08/13/19   Moshe Cipro, NP  budesonide-formoterol [facility]) 160-4.5 MCG/ACT inhaler Inhale 2 puffs into the lungs 2 (two) times daily. 08/13/19 09/12/19  Moshe Cipro, NP  escitalopram (LEXAPRO) 10 MG tablet Take 10 mg by mouth daily.    [provider]  ipratropium-albuterol (DUONEB) 0.5-2.5 (3) MG/3ML SOLN Take 3 mLs by nebulization every 4 (four) hours as needed. 08/13/19   Moshe Cipro, NP  levocetirizine (XYZAL) 5 MG tablet Take 1 tablet (5 mg total) by mouth every evening. 08/13/19   Moshe Cipro, NP  montelukast  (SINGULAIR) 10 MG tablet Take 1 tablet (10 mg total) by mouth at bedtime. 08/13/19   Moshe Cipro, NP  ondansetron (ZOFRAN) 4 MG tablet Take 1 tablet (4 mg total) by mouth every 8 (eight) hours as needed for nausea or vomiting. 09/20/18   Tommie Sams, DO  pantoprazole (PROTONIX) 40 MG tablet Take 1 tablet (40 mg total) by mouth daily. 09/20/18   Tommie Sams, DO  predniSONE (STERAPRED UNI-PAK 21 TAB) 10 MG (21) TBPK tablet Take by mouth daily. Take 6 tabs by mouth daily  for 2 days, then 5 tabs for 2 days, then 4 tabs for 2 days, then 3 tabs for 2 days, 2 tabs for 2 days, then 1 tab by mouth daily for 2 days 08/13/19   Moshe Cipro, NP    Allergies Fruit & vegetable daily [nutritional supplements] and Latex  Family History  Problem Relation Age of Onset  . Cancer Mother   . Hypertension Mother   . Cancer Brother   . Healthy Father     Social History Social History   Tobacco Use  . Smoking status: Former Games developer  . Smokeless tobacco: Never Used  Vaping Use  . Vaping Use: Former  Substance Use Topics  . Alcohol use: No  . Drug use: Never    Review of Systems  Constitutional: No fever/chills. Eyes: No redness. ENT: No sore throat. Cardiovascular: Denies chest pain. Respiratory: Positive for shortness of breath. Gastrointestinal: No vomiting or diarrhea.  Genitourinary: Negative for flank pain. Musculoskeletal: Negative for back pain. Skin: Negative for rash. Neurological: Negative for headache.   ____________________________________________   PHYSICAL EXAM:  VITAL SIGNS: ED Triage Vitals  Enc Vitals Group     BP 08/13/19 2101 (!) 150/106     Pulse Rate 08/13/19 2101 (!) 104     Resp 08/13/19 2101 (!) 26     Temp 08/13/19 2101 98.3 F (36.8 C)     Temp Source 08/13/19 2101 Oral     SpO2 08/13/19 2101 99 %     Weight 08/13/19 2058 194 lb 14.2 oz (88.4 kg)     Height 08/13/19 2058 5\' 9"  (1.753 m)     Head Circumference --      Peak Flow --      Pain  Score 08/13/19 2058 0     Pain Loc --      Pain Edu? --      Excl. in [place]? --     Constitutional: Alert and oriented.  Uncomfortable appearing but in no acute distress. Eyes: Conjunctivae are normal.  Head: Atraumatic. Nose: No congestion/rhinnorhea. Mouth/Throat: Mucous membranes are moist.   Neck: Normal range of motion.  Cardiovascular: Borderline tachycardic, regular rhythm. Grossly normal heart sounds.  Good peripheral circulation. Respiratory: Increased respiratory effort with some accessory muscle use.  Diffuse wheezing bilaterally. Gastrointestinal: Soft and nontender. No distention.  Genitourinary: No flank tenderness. Musculoskeletal: No lower extremity edema.  No calf or popliteal swelling or tenderness.  Extremities warm and well perfused.  Neurologic:  Normal speech and language. No gross focal neurologic deficits are appreciated.  Skin:  Skin is warm and dry. No rash noted. Psychiatric: Mood and affect are normal. Speech and behavior are normal.  ____________________________________________   LABS (all labs ordered are listed, but only abnormal results are displayed)  Labs Reviewed  COMPREHENSIVE METABOLIC PANEL - Abnormal; Notable for the following components:      Result Value   Potassium 3.3 (*)    CO2 20 (*)    Glucose, Bld 129 (*)    BUN <5 (*)    Total Protein 8.3 (*)    All other components within normal limits  CBC WITH DIFFERENTIAL/PLATELET - Abnormal; Notable for the following components:   MCV 79.5 (*)    MCH 24.9 (*)    Neutro Abs 8.3 (*)    Lymphs Abs 0.5 (*)    All other components within normal limits  MAGNESIUM  TROPONIN I (HIGH SENSITIVITY)  TROPONIN I (HIGH SENSITIVITY)   ____________________________________________  EKG  ED ECG REPORT I, 10/14/19, the attending physician, personally viewed and interpreted this ECG.  Date: 08/13/2019 EKG Time: 2141 Rate: 96 Rhythm: normal sinus rhythm QRS Axis: Borderline right  axis Intervals: normal ST/T Wave abnormalities: LVH with repolarization abnormality Narrative Interpretation: no evidence of acute ischemia  ____________________________________________  RADIOLOGY    ____________________________________________   PROCEDURES  Procedure(s) performed: No  Procedures  Critical Care performed: No ____________________________________________   INITIAL IMPRESSION / ASSESSMENT AND PLAN / ED COURSE  Pertinent labs & imaging results that were available during my care of the patient were reviewed by me and considered in my medical decision making (see chart for details).  39 year old female with PMH as noted above including a history of asthma presents with an asthma exacerbation characterized by shortness of breath and nonproductive cough occurring for at least several days.  She was seen at urgent care earlier today, got IM steroid and a prescription for prednisone and  albuterol but reports no relief.  I reviewed the past medical records in Epic and confirmed the urgent care visit today.  The patient had a chest x-ray earlier today which was negative.  On exam, the patient is uncomfortable appearing but in no acute distress.  She is hypertensive and borderline tachycardic with increased work of breathing.  She is speaking in short sentences but has an O2 saturation in the mid 90s on room air.  She has diffuse wheezing bilaterally.    Overall presentation is consistent with acute asthma exacerbation.  There is no evidence for pneumonia or other infectious etiology.  I also do not suspect cardiac cause.  There is no indication for repeat x-ray.  The patient has already had 2 doses of steroid today both IM and p.o., so there is no indication for additional steroids.  We will give IV magnesium, duo nebs, and reassess.  ----------------------------------------- 12:22 AM on 08/14/2019 -----------------------------------------  The patient reports  improvement in her symptoms.  Her wheezing has markedly decreased.  O2 saturation remains in the mid 90s.  Given her relatively severe symptoms earlier, I recommended that we watch her for an additional 1 to 2 hours.  She agrees with this plan.  I signed her out to the oncoming physician Dr. Lenard Lance.  ____________________________________________   FINAL CLINICAL IMPRESSION(S) / ED DIAGNOSES  Final diagnoses:  Exacerbation of asthma, unspecified asthma severity, unspecified whether persistent      NEW MEDICATIONS STARTED DURING THIS VISIT:  New Prescriptions   No medications on file     Note:  This document was prepared using Dragon voice recognition software and may include unintentional dictation errors.    Dionne Bucy, MD 08/14/19 Rich Fuchs

## 2019-08-13 NOTE — ED Triage Notes (Signed)
Patient c/o SOB and cough off and on for couple of weeks.  Patient has history of asthma.  Patient states that her asthma attack started this morning.  Patient denies fevers.

## 2019-08-13 NOTE — ED Notes (Signed)
Pt c/o left arm pain. MD aware.

## 2019-08-13 NOTE — ED Provider Notes (Signed)
[facility]   [id number] 08/13/19 Arrival Time: 1053   SUBJECTIVE:  Roberta Soto is a 39 y.o. female who presents with complaint of nasal congestion, post-nasal drainage, and a persistent cough. Onset abrupt approximately several weeks ago.  Reports that she just found out that she has insurance, so she is here to be seen for chronic asthma.  Reports that this exacerbation has been going on for "a while."  She cannot recall the exact date.  She reports that she has been using her son's inhalers and neb treatments.  Denies that she has had Covid or has had Covid vaccines.  Overall fatigued. SOB: moderate. Wheezing: Moderate. OTC treatment: No OTC medications for this. She does not have a primary care provider Social History   Tobacco Use  Smoking Status Former Smoker  Smokeless Tobacco Never Used    ROS: As per HPI.   OBJECTIVE:  Vitals:   08/13/19 1058  BP: (!) 168/111  Pulse: 99  Resp: 17  Temp: 98.7 F (37.1 C)  TempSrc: Oral  SpO2: 96%  Weight: 195 lb (88.5 kg)  Height: 5\' 9"  (1.753 m)     General appearance: alert; no distress HEENT: nasal congestion; clear runny nose; throat irritation secondary to post-nasal drainage Neck: supple without LAD Lungs: Diffuse wheezing throughout all lung fields, diminished lung sounds in the bases  skin: warm and dry Psychological: alert and cooperative; normal mood and affect  Results for orders placed or performed during the hospital encounter of 10/28/18  Novel Coronavirus, NAA (hospital order; send-out to ref lab)   Specimen: Nasopharyngeal Swab; Respiratory  Result Value Ref Range   SARS-CoV-2, NAA NOT DETECTED NOT DETECTED   Coronavirus Source NASOPHARYNGEAL     Labs Reviewed - No data to display  No results found.  Allergies  Allergen Reactions  . Fruit & Vegetable Daily [Nutritional Supplements]   . Latex Rash    Past Medical History:  Diagnosis Date  . Asthma   . Hypertension    Social History     Socioeconomic History  . Marital status: Unknown    Spouse name: Not on file  . Number of children: Not on file  . Years of education: Not on file  . Highest education level: Not on file  Occupational History  . Not on file  Tobacco Use  . Smoking status: Former 10/30/18  . Smokeless tobacco: Never Used  Vaping Use  . Vaping Use: Former  Substance and Sexual Activity  . Alcohol use: No  . Drug use: Never  . Sexual activity: Not on file  Other Topics Concern  . Not on file  Social History Narrative  . Not on file   Social Determinants of Health   Financial Resource Strain:   . Difficulty of Paying Living Expenses:   Food Insecurity:   . Worried About Games developer in the Last Year:   . Programme researcher, broadcasting/film/video in the Last Year:   Transportation Needs:   . Barista (Medical):   Freight forwarder Lack of Transportation (Non-Medical):   Physical Activity:   . Days of Exercise per Week:   . Minutes of Exercise per Session:   Stress:   . Feeling of Stress :   Social Connections:   . Frequency of Communication with Friends and Family:   . Frequency of Social Gatherings with Friends and Family:   . Attends Religious Services:   . Active Member of Clubs or Organizations:   . Attends Club  or Organization Meetings:   Marland Kitchen Marital Status:   Intimate Partner Violence:   . Fear of Current or Ex-Partner:   . Emotionally Abused:   Marland Kitchen Physically Abused:   . Sexually Abused:    Family History  Problem Relation Age of Onset  . Cancer Mother   . Hypertension Mother   . Cancer Brother   . Healthy Father      ASSESSMENT & PLAN:  1. Cough   2. Shortness of breath   3. Moderate persistent asthma with exacerbation   4. Elevated blood pressure reading     Meds ordered this encounter  Medications  . dexamethasone (DECADRON) injection 10 mg  . DISCONTD: albuterol (VENTOLIN HFA) 108 (90 Base) MCG/ACT inhaler 2 puff  . DISCONTD: aerochamber plus with mask device 1 each  .  budesonide-formoterol (SYMBICORT) 160-4.5 MCG/ACT inhaler    Sig: Inhale 2 puffs into the lungs 2 (two) times daily.    Dispense:  1 Inhaler    Refill:  12    Order Specific Question:   Supervising Provider    Answer:   Merrilee Jansky [id number]  . albuterol (VENTOLIN HFA) 108 (90 Base) MCG/ACT inhaler    Sig: Inhale 2 puffs into the lungs every 4 (four) hours as needed for wheezing or shortness of breath.    Dispense:  18 g    Refill:  0    Order Specific Question:   Supervising Provider    Answer:   Merrilee Jansky [id number]  . levocetirizine (XYZAL) 5 MG tablet    Sig: Take 1 tablet (5 mg total) by mouth every evening.    Dispense:  90 tablet    Refill:  3    Order Specific Question:   Supervising Provider    Answer:   Merrilee Jansky [id number]  . montelukast (SINGULAIR) 10 MG tablet    Sig: Take 1 tablet (10 mg total) by mouth at bedtime.    Dispense:  90 tablet    Refill:  2    Order Specific Question:   Supervising Provider    Answer:   Merrilee Jansky [id number]  . predniSONE (STERAPRED UNI-PAK 21 TAB) 10 MG (21) TBPK tablet    Sig: Take by mouth daily. Take 6 tabs by mouth daily  for 2 days, then 5 tabs for 2 days, then 4 tabs for 2 days, then 3 tabs for 2 days, 2 tabs for 2 days, then 1 tab by mouth daily for 2 days    Dispense:  42 tablet    Refill:  0    Order Specific Question:   Supervising Provider    Answer:   Merrilee Jansky [id number]  . ipratropium-albuterol (DUONEB) 0.5-2.5 (3) MG/3ML SOLN    Sig: Take 3 mLs by nebulization every 4 (four) hours as needed.    Dispense:  360 mL    Refill:  3    Order Specific Question:   Supervising Provider    Answer:   Merrilee Jansky [id number]   Chest x-ray negative Discussed elevated blood pressure with patient Will need to have blood pressure rechecked when she is not experiencing asthma symptoms Will treat with above therapy given duration of symptoms.  Cough medication sedation precautions. Discussed with  patient that she could probably use some IV magnesium to help open her up, this would require an ER visit.  She declines, does not want to go to the ER at this time OTC symptom care as needed. Will plan  f/u with PCP or here as needed. Strict ER precautions given Reviewed expectations re: course of current medical issues. Questions answered. Outlined signs and symptoms indicating need for more acute intervention. Patient verbalized understanding. After Visit Summary given.            Moshe Cipro, NP 08/13/19 1159

## 2019-08-13 NOTE — ED Notes (Signed)
Pt states she feels a little better and has calmed down some. Respiratory effort seems easier.

## 2019-08-14 LAB — SARS CORONAVIRUS 2 BY RT PCR (HOSPITAL ORDER, PERFORMED IN ~~LOC~~ HOSPITAL LAB): SARS Coronavirus 2: NEGATIVE

## 2019-08-14 MED ORDER — ALBUTEROL SULFATE (2.5 MG/3ML) 0.083% IN NEBU: 2.5000 mg | INHALATION_SOLUTION | Freq: Four times a day (QID) | RESPIRATORY_TRACT | 1 refills | Status: AC | PRN

## 2019-08-14 MED ORDER — PREDNISONE 10 MG PO TABS
10.0000 mg | ORAL_TABLET | Freq: Every day | ORAL | 0 refills | Status: DC
Start: 2019-08-14 — End: 2020-03-09

## 2019-08-14 MED ORDER — ALBUTEROL SULFATE HFA 108 (90 BASE) MCG/ACT IN AERS
2.0000 | INHALATION_SPRAY | Freq: Four times a day (QID) | RESPIRATORY_TRACT | 1 refills | Status: DC | PRN
Start: 2019-08-14 — End: 2023-06-20

## 2019-08-14 MED ORDER — ACETAMINOPHEN 500 MG PO TABS
1000.0000 mg | ORAL_TABLET | Freq: Once | ORAL | Status: AC
  Administered 2019-08-14: 1000 mg via ORAL
  Filled 2019-08-14: qty 2

## 2019-08-14 NOTE — ED Provider Notes (Signed)
-----------------------------------------   1:56 AM on 08/14/2019 -----------------------------------------  I have personally seen and evaluated the patient, she appears much improved.  Satting 95 to 96% during my evaluation initially sleeping call me with a pulse rate around 100 respiratory rate around 20.  Upon awakening she states she feels much better states she feels like she is ready to go home.  We'll discharge with prednisone and albuterol both nebulizer and inhalers.  Patient agreeable to plan of care.  I discussed my typical asthma return precautions.   Minna Antis, MD 08/14/19 [phone]

## 2020-03-09 ENCOUNTER — Ambulatory Visit
Admission: EM | Admit: 2020-03-09 | Discharge: 2020-03-09 | Disposition: A | Discharge status: Home / Self Care | Payer: Medicaid Other | Attending: Physician Assistant | Admitting: Physician Assistant

## 2020-03-09 ENCOUNTER — Other Ambulatory Visit: Payer: Self-pay

## 2020-03-09 ENCOUNTER — Encounter: Payer: Self-pay | Admitting: Emergency Medicine

## 2020-03-09 ENCOUNTER — Ambulatory Visit (INDEPENDENT_AMBULATORY_CARE_PROVIDER_SITE_OTHER): Payer: Medicaid Other

## 2020-03-09 DIAGNOSIS — J45901 Unspecified asthma with (acute) exacerbation: Secondary | ICD-10-CM | POA: Diagnosis present

## 2020-03-09 DIAGNOSIS — J209 Acute bronchitis, unspecified: Secondary | ICD-10-CM | POA: Insufficient documentation

## 2020-03-09 DIAGNOSIS — Z8616 Personal history of COVID-19: Secondary | ICD-10-CM | POA: Insufficient documentation

## 2020-03-09 DIAGNOSIS — J45909 Unspecified asthma, uncomplicated: Secondary | ICD-10-CM

## 2020-03-09 DIAGNOSIS — R0602 Shortness of breath: Secondary | ICD-10-CM

## 2020-03-09 DIAGNOSIS — R059 Cough, unspecified: Secondary | ICD-10-CM | POA: Insufficient documentation

## 2020-03-09 DIAGNOSIS — N926 Irregular menstruation, unspecified: Secondary | ICD-10-CM | POA: Diagnosis present

## 2020-03-09 DIAGNOSIS — U071 COVID-19: Secondary | ICD-10-CM | POA: Diagnosis not present

## 2020-03-09 LAB — PREGNANCY, URINE: Preg Test, Ur: NEGATIVE

## 2020-03-09 MED ORDER — PROMETHAZINE-CODEINE 6.25-10 MG/5ML PO SOLN: 5.0000 mL | Freq: Four times a day (QID) | ORAL | 0 refills | Status: AC | PRN

## 2020-03-09 MED ORDER — PREDNISONE 20 MG PO TABS
40.0000 mg | ORAL_TABLET | Freq: Every day | ORAL | 0 refills | Status: AC
Start: 2020-03-09 — End: 2020-03-14

## 2020-03-09 NOTE — ED Triage Notes (Signed)
Patient was diagnosed with COVID on 02/22/20.  Patient has had cough and congestion and SOB of on for over a week.  Patient denies fevers.  Patient reports history of asthma.  Patient states that she got her Depo shot in November and reports break through vaginal bleeding.

## 2020-03-09 NOTE — Discharge Instructions (Addendum)
Your chest x-ray does not show any evidence of pneumonia.  You likely have bronchitis due to COVID-19.  Continue using asthma inhalers.  I have sent prednisone for you as well.  Additionally, I have sent promethazine with codeine cough syrup.  Increase your fluid intake and rest.  Develop a fever or increased breathing difficulty you should be seen again.  For severe acute worsening symptoms call EMS or go to ED.  Your irregular vaginal bleeding is definitely related to your Depo injection.  Speak with your provider about this and see if you would like to continue getting injections.  It is not uncommon to have irregular vaginal bleeding with this medication.  Your pregnancy test was negative today.  If you have heavy bleeding or bleeding associated with pelvic pain or severe symptoms you should be seen again.

## 2020-03-09 NOTE — ED Provider Notes (Signed)
[facility]    CSN: [id number] Arrival date & time: 03/09/20  1016      History   Chief Complaint Chief Complaint  Patient presents with  . Shortness of Breath  . Cough  . Vaginal Bleeding    HPI Roberta Soto is a 40 y.o. female presenting with multiple complaints. First, she says that she has had continued cough, congestion, and SOB since being diagnosed with COVID 19 on 02/22/2020. She does have history of asthma.  Patient states that she has been "overly using" her asthma inhalers.  She says she has Symbicort, Atrovent and albuterol.  She says that despite using her inhaler she still feels short of breath.  She also notes to some pain in her upper back.  Denies any fevers or fatigue.  Occasionally has some nasal congestion.  She says that the cough is sometimes dry and sometimes "wet."  Patient has tried over-the-counter cough medication, but states that it does not seem to be working.  Patient concerned for her continued cough and states that it keeps her up at night.  She also says that she gets into coughing fits and cannot stop coughing.  Additionally, patient states she has had irregular vaginal bleeding. She states that she received a depo provera injection in November 2021 and has had recent vaginal spotting.  She says that she has had vaginal spotting for about 2 to 3 weeks, but the bleeding is not heavy enough to have to use a tampon or pad.  She denies any pelvic pain, dysuria or vaginal discharge.  No concern for STIs.  Patient states that she has taken Depo in the past and it previously just stopped her menstrual periods.   HPI  Past Medical History:  Diagnosis Date  . Asthma   . Hypertension     There are no problems to display for this patient.   Past Surgical History:  Procedure Laterality Date  . c-section     X4    OB History   No obstetric history on file.      Home Medications    Prior to Admission medications   Medication Sig Start  Date End Date Taking? Authorizing Provider  albuterol (VENTOLIN HFA) 108 (90 Base) MCG/ACT inhaler Inhale 2 puffs into the lungs every 6 (six) hours as needed for wheezing or shortness of breath. 08/14/19  Yes Minna Antis, MD  budesonide-formoterol [facility] 160-4.5 MCG/ACT inhaler Inhale 2 puffs into the lungs 2 (two) times daily. 08/13/19 09/12/19 Yes Moshe Cipro, NP  escitalopram (LEXAPRO) 10 MG tablet Take 10 mg by mouth daily.   Yes [provider]  levocetirizine (XYZAL) 5 MG tablet Take 1 tablet (5 mg total) by mouth every evening. 08/13/19  Yes Moshe Cipro, NP  medroxyPROGESTERone (DEPO-PROVERA) 150 MG/ML injection Inject 150 mg into the muscle every 3 (three) months.   Yes [provider]  ondansetron (ZOFRAN) 4 MG tablet Take 1 tablet (4 mg total) by mouth every 8 (eight) hours as needed for nausea or vomiting. 09/20/18  Yes Cook, Jayce G, DO  pantoprazole (PROTONIX) 40 MG tablet Take 1 tablet (40 mg total) by mouth daily. 09/20/18  Yes Cook, Jayce G, DO  predniSONE (DELTASONE) 20 MG tablet Take 2 tablets (40 mg total) by mouth daily for 5 days. 03/09/20 03/14/20 Yes Shirlee Latch, PA-C  Promethazine-Codeine 6.25-10 MG/5ML SOLN Take 5 mLs by mouth 4 (four) times daily as needed for up to 7 days. 03/09/20 03/16/20 Yes Shirlee Latch,  PA-C  albuterol (PROVENTIL) (2.5 MG/3ML) 0.083% nebulizer solution Take 3 mLs (2.5 mg total) by nebulization every 6 (six) hours as needed for wheezing or shortness of breath. 08/14/19   Minna Antis, MD  ipratropium-albuterol (DUONEB) 0.5-2.5 (3) MG/3ML SOLN Take 3 mLs by nebulization every 4 (four) hours as needed. 08/13/19   Moshe Cipro, NP  montelukast (SINGULAIR) 10 MG tablet Take 1 tablet (10 mg total) by mouth at bedtime. 08/13/19   Moshe Cipro, NP    Family History Family History  Problem Relation Age of Onset  . Cancer Mother   . Hypertension Mother   . Cancer Brother   . Healthy Father     Social  History Social History   Tobacco Use  . Smoking status: Former Games developer  . Smokeless tobacco: Never Used  Vaping Use  . Vaping Use: Former  Substance Use Topics  . Alcohol use: No  . Drug use: Never     Allergies   Fruit & vegetable daily [nutritional supplements] and Latex   Review of Systems Review of Systems  Constitutional: Negative for chills, diaphoresis, fatigue and fever.  HENT: Positive for congestion. Negative for ear pain, rhinorrhea, sinus pressure, sinus pain and sore throat.   Respiratory: Positive for cough and shortness of breath. Negative for chest tightness.   Cardiovascular: Negative for chest pain.  Gastrointestinal: Negative for abdominal pain, nausea and vomiting.  Genitourinary: Positive for vaginal bleeding. Negative for dysuria, pelvic pain and vaginal discharge.  Musculoskeletal: Positive for back pain. Negative for arthralgias and myalgias.  Skin: Negative for rash.  Neurological: Negative for weakness and headaches.  Hematological: Negative for adenopathy.     Physical Exam Triage Vital Signs ED Triage Vitals  Enc Vitals Group     BP 03/09/20 1106 (!) 149/96     Pulse Rate 03/09/20 1106 75     Resp 03/09/20 1106 14     Temp 03/09/20 1106 98.3 F (36.8 C)     Temp Source 03/09/20 1106 Oral     SpO2 03/09/20 1106 99 %     Weight 03/09/20 1102 216 lb (98 kg)     Height 03/09/20 1102 5\' 9"  (1.753 m)     Head Circumference --      Peak Flow --      Pain Score 03/09/20 1101 5     Pain Loc --      Pain Edu? --      Excl. in GC? --    No data found.  Updated Vital Signs BP (!) 149/96 (BP Location: Left Arm)   Pulse 75   Temp 98.3 F (36.8 C) (Oral)   Resp 14   Ht 5\' 9"  (1.753 m)   Wt 216 lb (98 kg)   SpO2 99%   BMI 31.90 kg/m       Physical Exam Vitals and nursing note reviewed.  Constitutional:      General: She is not in acute distress.    Appearance: Normal appearance. She is not ill-appearing or toxic-appearing.  HENT:      Head: Normocephalic and atraumatic.     Nose: Nose normal.     Mouth/Throat:     Mouth: Mucous membranes are moist.     Pharynx: Oropharynx is clear.  Eyes:     General: No scleral icterus.       Right eye: No discharge.        Left eye: No discharge.     Conjunctiva/sclera: Conjunctivae normal.  Cardiovascular:  Rate and Rhythm: Normal rate and regular rhythm.     Heart sounds: Normal heart sounds.  Pulmonary:     Effort: Pulmonary effort is normal. No respiratory distress.     Breath sounds: Wheezing (mild to moderate diffuse wheezing and rhonchi throughout chest) and rhonchi present.  Musculoskeletal:     Cervical back: Neck supple.  Skin:    General: Skin is dry.  Neurological:     General: No focal deficit present.     Mental Status: She is alert. Mental status is at baseline.     Motor: No weakness.     Gait: Gait normal.  Psychiatric:        Mood and Affect: Mood normal.        Behavior: Behavior normal.        Thought Content: Thought content normal.      UC Treatments / Results  Labs (all labs ordered are listed, but only abnormal results are displayed) Labs Reviewed  PREGNANCY, URINE    EKG   Radiology DG Chest 2 View  Result Date: 03/09/2020 CLINICAL DATA:  Persistent shortness of breath. Recent COVID infection. EXAM: CHEST - 2 VIEW COMPARISON:  Chest x-ray dated August 13, 2019. FINDINGS: The heart size and mediastinal contours are within normal limits. Both lungs are clear. The visualized skeletal structures are unremarkable. IMPRESSION: No active cardiopulmonary disease. Electronically Signed   By: Obie Dredge M.D.   On: 03/09/2020 12:04    Procedures Procedures (including critical care time)  Medications Ordered in UC Medications - No data to display  Initial Impression / Assessment and Plan / UC Course  I have reviewed the triage vital signs and the nursing notes.  Pertinent labs & imaging results that were available during my care of  the patient were reviewed by me and considered in my medical decision making (see chart for details).     1. Cough/congestion/COVID/Asthma exacerbation: 40 year old female with uncontrolled asthma presenting for continued cough, congestion and shortness of breath for the past 2 weeks after COVID-19 diagnosis.  All her vital signs are normal and stable in clinic other than her blood pressure which is elevated at 149/96.  Her chest is positive for diffuse mild to moderate wheezing and rhonchi throughout the chest.  She is in no acute respiratory distress however.  A chest x-ray was performed today and independently reviewed by me.  Chest x-ray does not show any acute cardiopulmonary abnormality.  Suspect viral bronchitis and asthma exacerbation due to COVID-19.  No indication for antibiotics.  Treating with prednisone and I also sent in promethazine with codeine.  Patient states she is taking this cough syrup before and it has helped her.  Controlled substance database reviewed and patient low risk for abuse.  Advised her to follow-up with her primary as soon as she can because she likely needs a referral to pulmonologist so that she can get tighter control over her asthma.  ED precautions reviewed with patient.  2. Irregular vaginal bleeding: Pregnancy test negative today.  Patient not concerned for STIs.  Advised patient that her irregular vaginal bleeding is related to the Depo-Provera.  Explained that she should follow-up with her gynecologist about this.  She is due for her next injection in a month.  Advised her to speak with a gynecologist and see if this form of birth control still right for her.  Explained that it is not uncommon to have some irregular bleeding initially, but sometimes it works itself out.  Advised  her to take over-the-counter daily women's vitamin with iron and increased rest and fluids.  Advised to go to ED for heavy bleeding or any associated pelvic pain.   Final Clinical  Impressions(s) / UC Diagnoses   Final diagnoses:  Acute bronchitis, unspecified organism  Cough  Asthma with acute exacerbation, unspecified asthma severity, unspecified whether persistent  Shortness of breath  Personal history of COVID-19  Irregular menstrual bleeding     Discharge Instructions     Your chest x-ray does not show any evidence of pneumonia.  You likely have bronchitis due to COVID-19.  Continue using asthma inhalers.  I have sent prednisone for you as well.  Additionally, I have sent promethazine with codeine cough syrup.  Increase your fluid intake and rest.  Develop a fever or increased breathing difficulty you should be seen again.  For severe acute worsening symptoms call EMS or go to ED.  Your irregular vaginal bleeding is definitely related to your Depo injection.  Speak with your provider about this and see if you would like to continue getting injections.  It is not uncommon to have irregular vaginal bleeding with this medication.  Your pregnancy test was negative today.  If you have heavy bleeding or bleeding associated with pelvic pain or severe symptoms you should be seen again.    ED Prescriptions    Medication Sig Dispense Auth. Provider   predniSONE (DELTASONE) 20 MG tablet Take 2 tablets (40 mg total) by mouth daily for 5 days. 10 tablet Eusebio Friendly B, PA-C   Promethazine-Codeine 6.25-10 MG/5ML SOLN Take 5 mLs by mouth 4 (four) times daily as needed for up to 7 days. 120 mL Shirlee Latch, PA-C     I have reviewed the PDMP during this encounter.   Shirlee Latch, PA-C 03/09/20 1220

## 2021-07-22 IMAGING — CR DG CHEST 2V
2 series · 2 of 2 positions shown · non-contrast
Comparison: None.

CLINICAL DATA: Shortness of breath on off for couple weeks

EXAM:
CHEST - 2 VIEW

[chest pa]
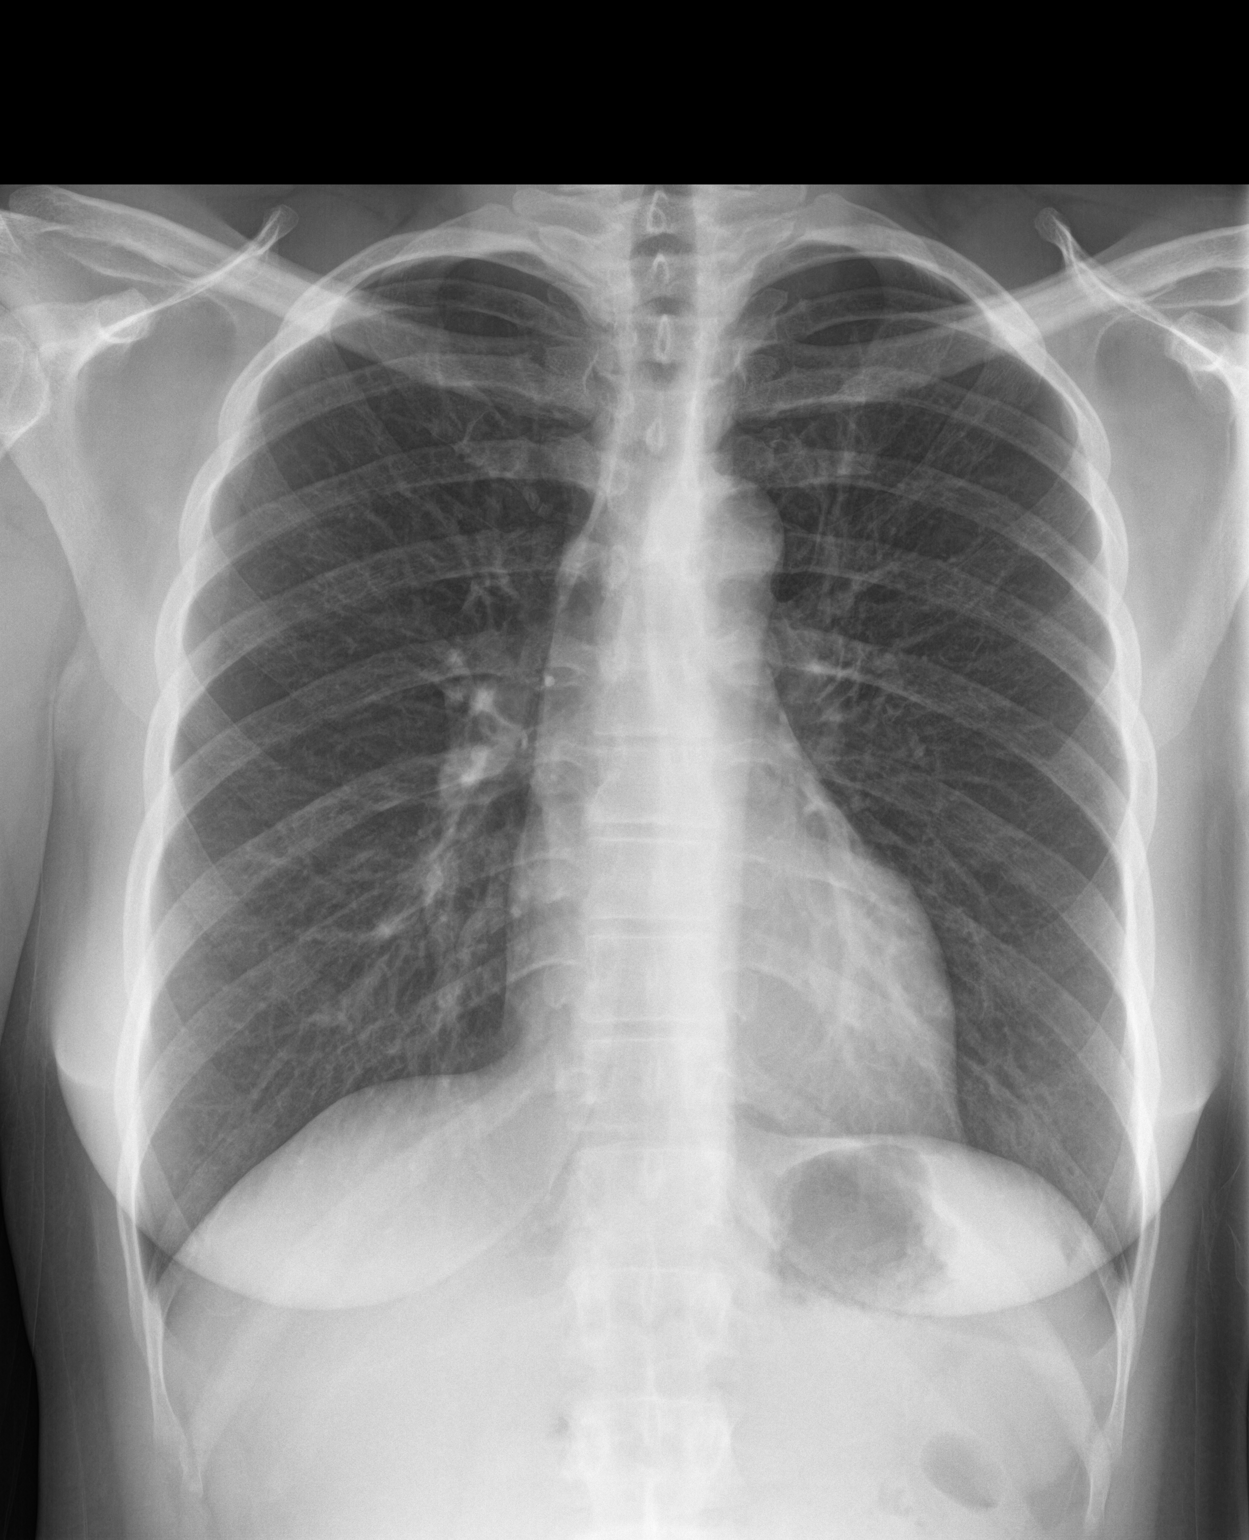

[chest lat]
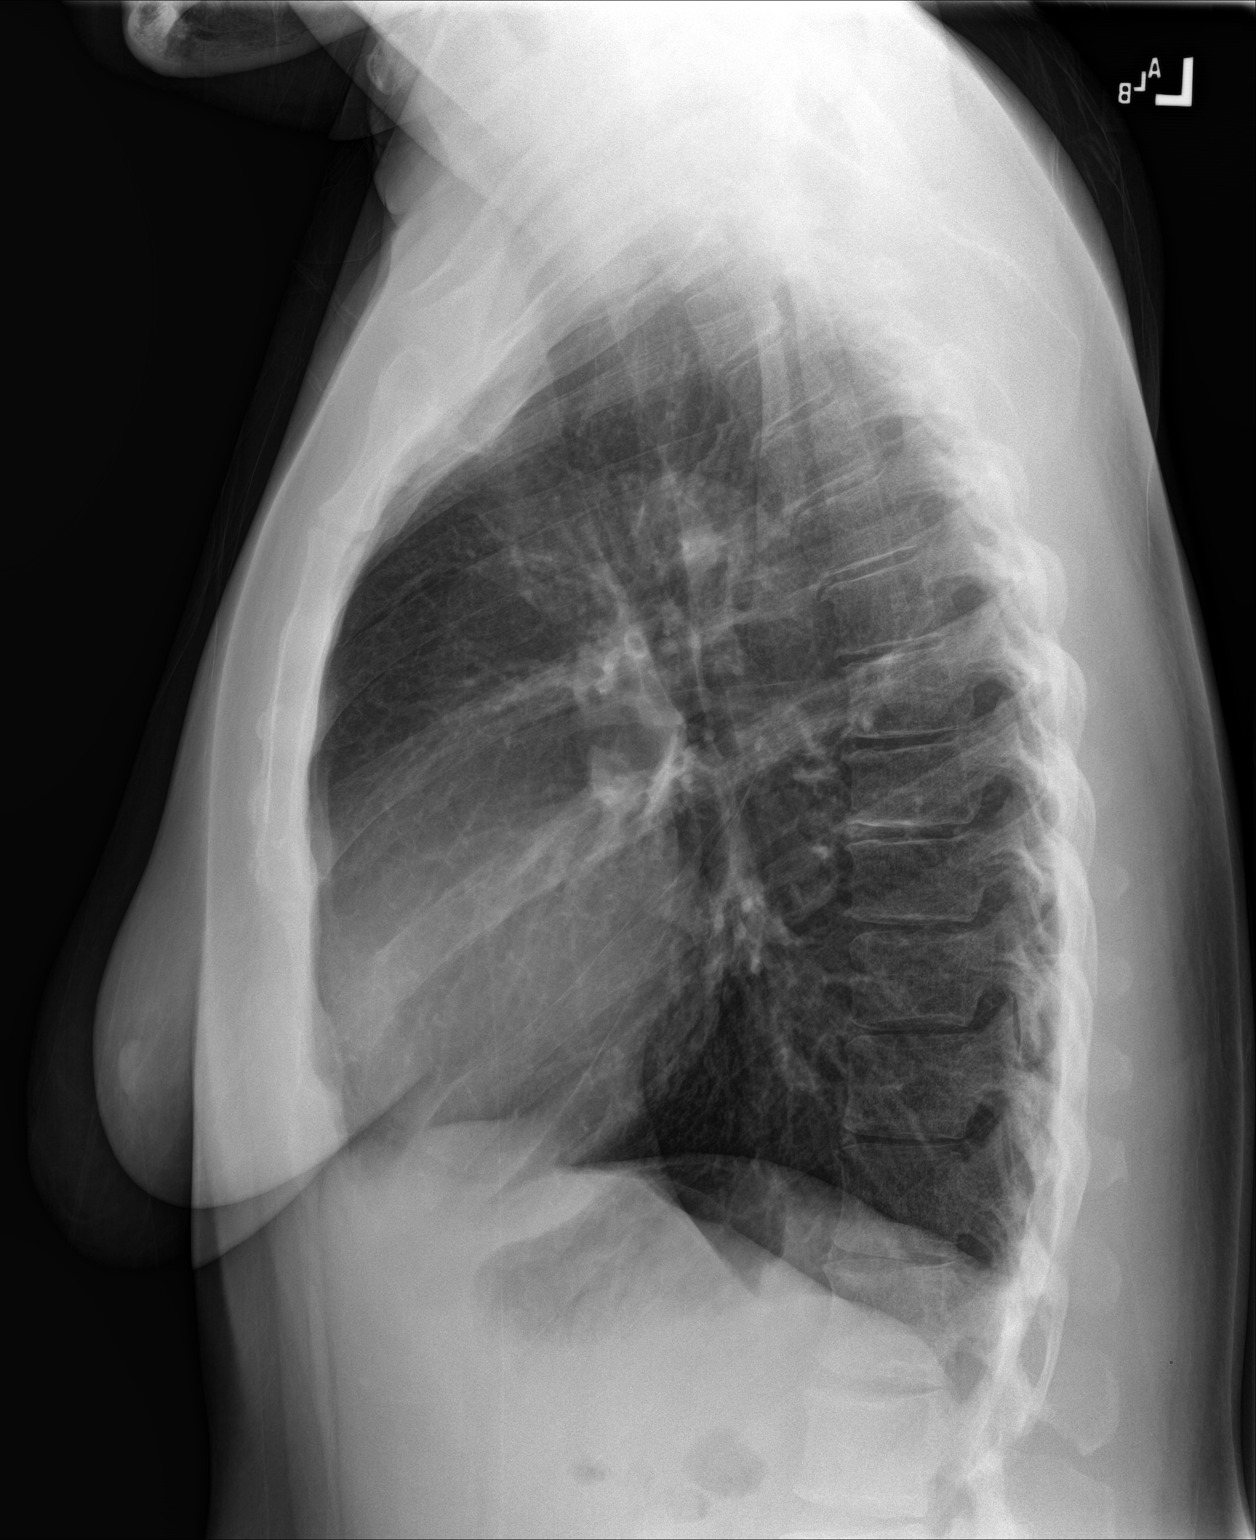

[2 of 2 positions shown; findings below may reference images not displayed]

FINDINGS: The heart size and mediastinal contours are within normal limits.
Both lungs are clear. The visualized skeletal structures are
unremarkable.
IMPRESSION: No active cardiopulmonary disease.

## 2022-02-16 IMAGING — CR DG CHEST 2V
2 series · 2 of 2 positions shown · non-contrast
Comparison: Chest x-ray dated August 13, 2019.

CLINICAL DATA: Persistent shortness of breath. Recent COVID
infection.

EXAM:
CHEST - 2 VIEW

[chest pa]
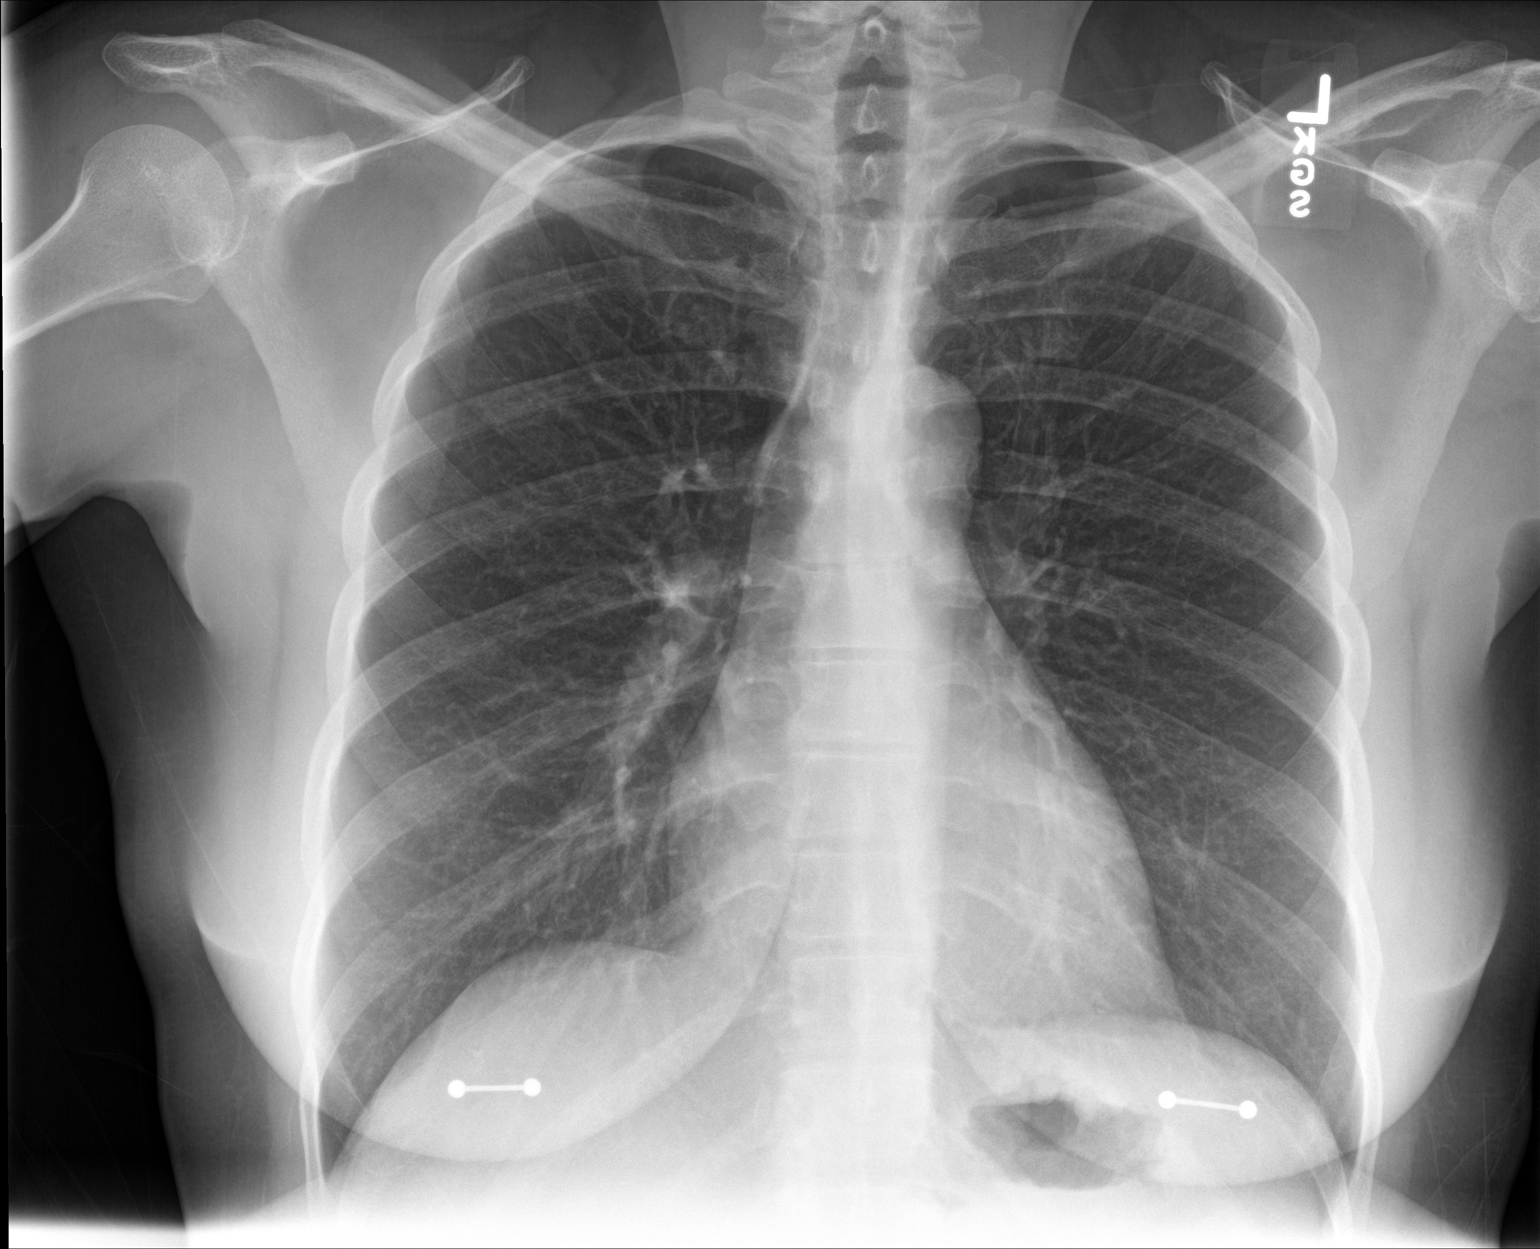

[chest lat]
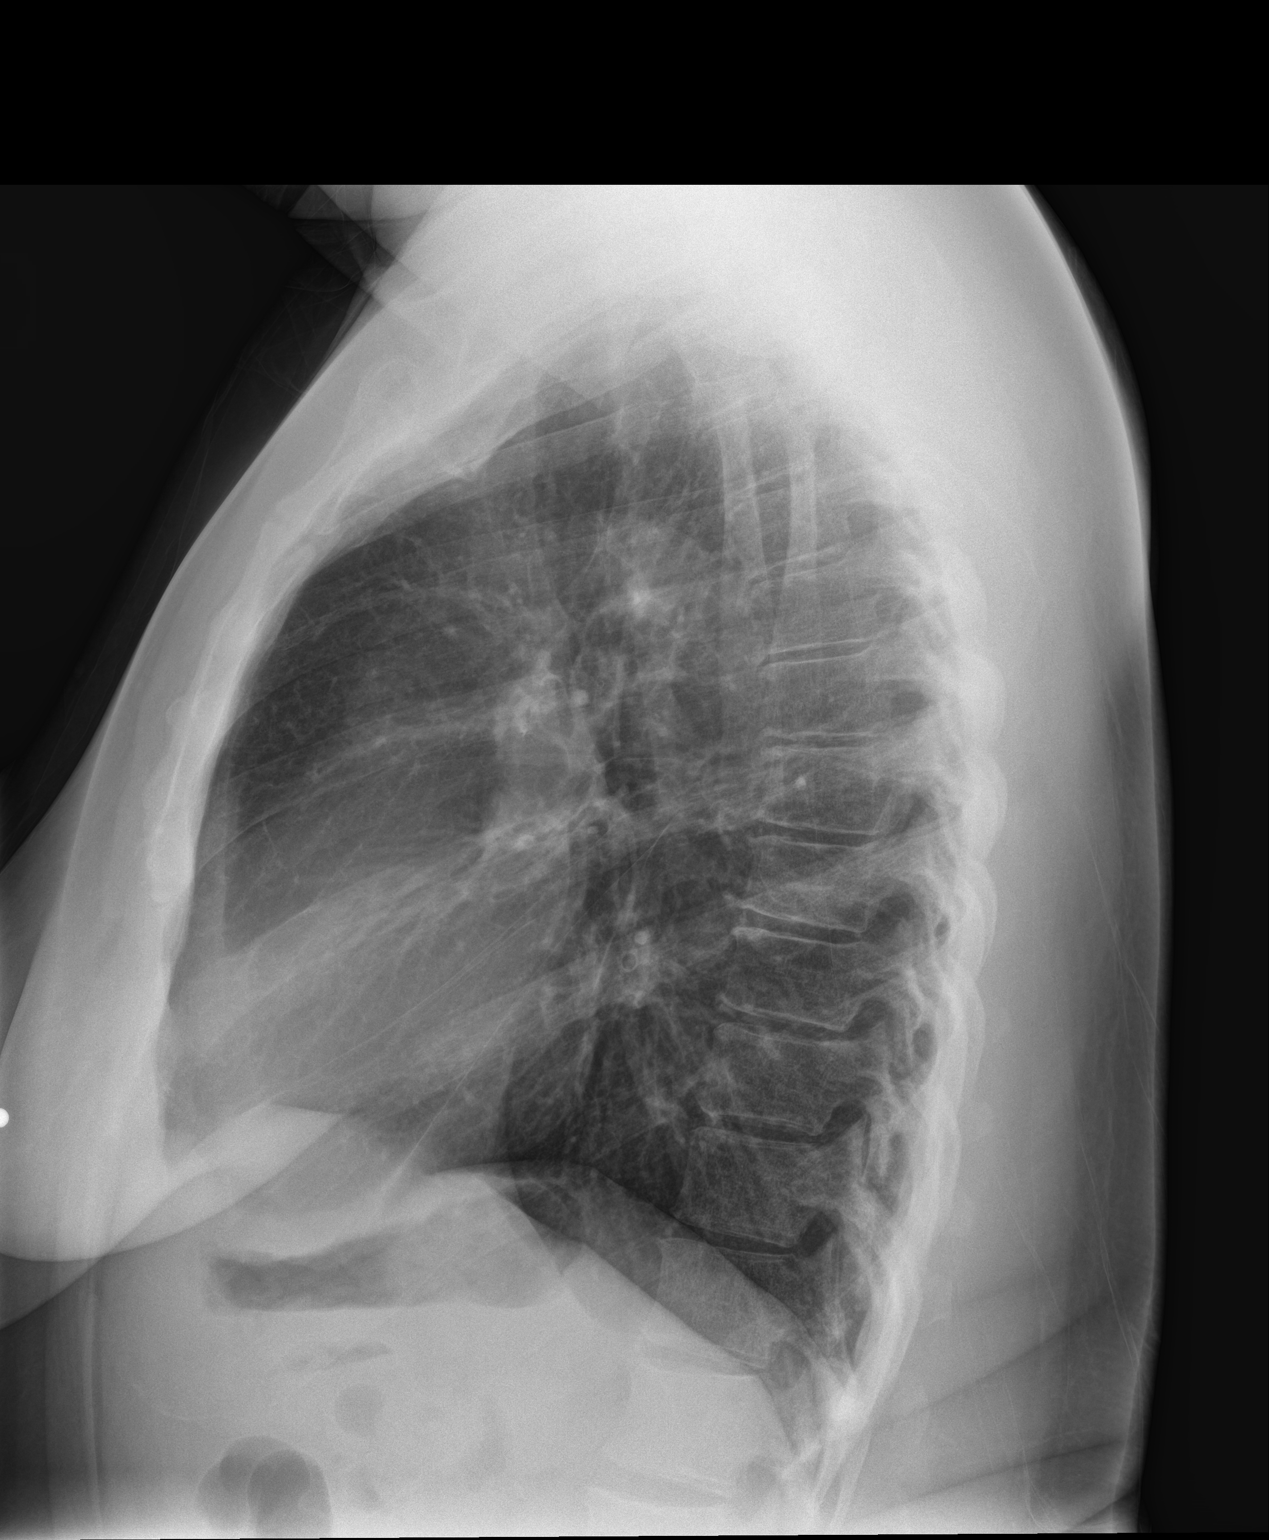

[2 of 2 positions shown; findings below may reference images not displayed]

FINDINGS: The heart size and mediastinal contours are within normal limits.
Both lungs are clear. The visualized skeletal structures are
unremarkable.
IMPRESSION: No active cardiopulmonary disease.

## 2022-10-21 ENCOUNTER — Emergency Department (HOSPITAL_BASED_OUTPATIENT_CLINIC_OR_DEPARTMENT_OTHER)
Admission: EM | Admit: 2022-10-21 | Discharge: 2022-10-21 | Disposition: A | Discharge status: Home / Self Care | Payer: Medicaid Other | Attending: Emergency Medicine | Admitting: Emergency Medicine

## 2022-10-21 ENCOUNTER — Emergency Department (HOSPITAL_BASED_OUTPATIENT_CLINIC_OR_DEPARTMENT_OTHER): Payer: Medicaid Other

## 2022-10-21 ENCOUNTER — Encounter (HOSPITAL_BASED_OUTPATIENT_CLINIC_OR_DEPARTMENT_OTHER): Payer: Self-pay | Admitting: Emergency Medicine

## 2022-10-21 ENCOUNTER — Other Ambulatory Visit: Payer: Self-pay

## 2022-10-21 DIAGNOSIS — Z91040 Latex allergy status: Secondary | ICD-10-CM | POA: Diagnosis not present

## 2022-10-21 DIAGNOSIS — Z20822 Contact with and (suspected) exposure to covid-19: Secondary | ICD-10-CM | POA: Diagnosis not present

## 2022-10-21 DIAGNOSIS — Z716 Tobacco abuse counseling: Secondary | ICD-10-CM | POA: Insufficient documentation

## 2022-10-21 DIAGNOSIS — B349 Viral infection, unspecified: Secondary | ICD-10-CM | POA: Insufficient documentation

## 2022-10-21 DIAGNOSIS — F17200 Nicotine dependence, unspecified, uncomplicated: Secondary | ICD-10-CM | POA: Diagnosis not present

## 2022-10-21 DIAGNOSIS — Z79899 Other long term (current) drug therapy: Secondary | ICD-10-CM | POA: Insufficient documentation

## 2022-10-21 DIAGNOSIS — R0602 Shortness of breath: Secondary | ICD-10-CM | POA: Diagnosis present

## 2022-10-21 DIAGNOSIS — Z7951 Long term (current) use of inhaled steroids: Secondary | ICD-10-CM | POA: Insufficient documentation

## 2022-10-21 DIAGNOSIS — I10 Essential (primary) hypertension: Secondary | ICD-10-CM | POA: Insufficient documentation

## 2022-10-21 DIAGNOSIS — J4541 Moderate persistent asthma with (acute) exacerbation: Secondary | ICD-10-CM | POA: Insufficient documentation

## 2022-10-21 LAB — CBC WITH DIFFERENTIAL/PLATELET
Abs Immature Granulocytes: 0.22 10*3/uL — ABNORMAL HIGH (ref 0.00–0.07)
Basophils Absolute: 0 10*3/uL (ref 0.0–0.1)
Basophils Relative: 0 %
Eosinophils Absolute: 0 10*3/uL (ref 0.0–0.5)
Eosinophils Relative: 0 %
HCT: 35.4 % — ABNORMAL LOW (ref 36.0–46.0)
Hemoglobin: 10.2 g/dL — ABNORMAL LOW (ref 12.0–15.0)
Immature Granulocytes: 1 %
Lymphocytes Relative: 5 %
Lymphs Abs: 0.8 10*3/uL (ref 0.7–4.0)
MCH: 20.9 pg — ABNORMAL LOW (ref 26.0–34.0)
MCHC: 28.8 g/dL — ABNORMAL LOW (ref 30.0–36.0)
MCV: 72.4 fL — ABNORMAL LOW (ref 80.0–100.0)
Monocytes Absolute: 0.5 10*3/uL (ref 0.1–1.0)
Monocytes Relative: 3 %
Neutro Abs: 15.6 10*3/uL — ABNORMAL HIGH (ref 1.7–7.7)
Neutrophils Relative %: 91 %
Platelets: 509 10*3/uL — ABNORMAL HIGH (ref 150–400)
RBC: 4.89 MIL/uL (ref 3.87–5.11)
RDW: 16.6 % — ABNORMAL HIGH (ref 11.5–15.5)
WBC: 17.2 10*3/uL — ABNORMAL HIGH (ref 4.0–10.5)
nRBC: 0 % (ref 0.0–0.2)

## 2022-10-21 LAB — RESP PANEL BY RT-PCR (RSV, FLU A&B, COVID)  RVPGX2
Influenza A by PCR: NEGATIVE
Influenza B by PCR: NEGATIVE
Resp Syncytial Virus by PCR: NEGATIVE
SARS Coronavirus 2 by RT PCR: NEGATIVE

## 2022-10-21 LAB — BASIC METABOLIC PANEL
Anion gap: 11 (ref 5–15)
BUN: 9 mg/dL (ref 6–20)
CO2: 24 mmol/L (ref 22–32)
Calcium: 8.6 mg/dL — ABNORMAL LOW (ref 8.9–10.3)
Chloride: 103 mmol/L (ref 98–111)
Creatinine, Ser: 0.86 mg/dL (ref 0.44–1.00)
GFR, Estimated: >60 mL/min (ref >60)
Glucose, Bld: 91 mg/dL (ref 70–99)
Potassium: 3.4 mmol/L — ABNORMAL LOW (ref 3.5–5.1)
Sodium: 138 mmol/L (ref 135–145)

## 2022-10-21 MED ORDER — MAGNESIUM SULFATE 2 GM/50ML IV SOLN
2.0000 g | Freq: Once | INTRAVENOUS | Status: AC
  Administered 2022-10-21: 2 g via INTRAVENOUS
  Filled 2022-10-21: qty 50

## 2022-10-21 MED ORDER — ALBUTEROL SULFATE (2.5 MG/3ML) 0.083% IN NEBU: 15.0000 mg | INHALATION_SOLUTION | RESPIRATORY_TRACT | Status: AC

## 2022-10-21 MED ORDER — ALBUTEROL SULFATE HFA 108 (90 BASE) MCG/ACT IN AERS
2.0000 | INHALATION_SPRAY | RESPIRATORY_TRACT | 0 refills | Status: AC | PRN
Start: 2022-10-21 — End: 2024-07-07

## 2022-10-21 MED ORDER — IPRATROPIUM-ALBUTEROL 0.5-2.5 (3) MG/3ML IN SOLN
3.0000 mL | Freq: Once | RESPIRATORY_TRACT | Status: AC
  Administered 2022-10-21: 3 mL via RESPIRATORY_TRACT
  Filled 2022-10-21: qty 3

## 2022-10-21 MED ORDER — ALBUTEROL SULFATE HFA 108 (90 BASE) MCG/ACT IN AERS
2.0000 | INHALATION_SPRAY | RESPIRATORY_TRACT | 0 refills | Status: DC | PRN
Start: 2022-10-21 — End: 2022-10-21

## 2022-10-21 MED ORDER — METHYLPREDNISOLONE SODIUM SUCC 125 MG IJ SOLR
125.0000 mg | Freq: Once | INTRAMUSCULAR | Status: AC
  Administered 2022-10-21: 125 mg via INTRAVENOUS
  Filled 2022-10-21: qty 2

## 2022-10-21 MED ORDER — SODIUM CHLORIDE 0.9 % IV SOLN: INTRAVENOUS | Status: DC | PRN

## 2022-10-21 MED ORDER — ALBUTEROL SULFATE HFA 108 (90 BASE) MCG/ACT IN AERS
1.0000 | INHALATION_SPRAY | Freq: Once | RESPIRATORY_TRACT | Status: AC
  Administered 2022-10-21: 2 via RESPIRATORY_TRACT
  Filled 2022-10-21: qty 6.7

## 2022-10-21 MED ORDER — ALBUTEROL SULFATE (2.5 MG/3ML) 0.083% IN NEBU
INHALATION_SOLUTION | RESPIRATORY_TRACT | Status: AC
  Administered 2022-10-21: 15 mg via RESPIRATORY_TRACT
  Filled 2022-10-21: qty 18

## 2022-10-21 NOTE — ED Notes (Signed)
D/c paperwork reviewed with pt, including prescriptions.  All questions and/or concerns addressed at time of d/c.  No further needs expressed. . Pt verbalized understanding, Ambulatory without assistance to ED exit, NAD.   

## 2022-10-21 NOTE — ED Triage Notes (Addendum)
Patient c/o shob since Friday night that worsened 2 days ago. Also c/o cough "that just wont go away" HO asthma. Current smoker but report decreasing the amount she smokes. Some distress noted in triage. RT in triage to assess - wheezing noted.  Used home medications with no relief. Denies known sick exposure.

## 2022-10-21 NOTE — ED Provider Notes (Signed)
[facility] Provider Note   CSN: [id number] Arrival date & time: 10/21/22  1715     History  Chief Complaint  Patient presents with   Shortness of Breath   Cough    Roberta Soto is a 42 y.o. female with past medical history hypertension and asthma who presents to the ED complaining of dyspnea and cough for the last 2 days.  Feels similar to an asthma exacerbation.  No known sick contacts.  No fever, chest pain, or other symptoms.  Has been using home inhaler without relief. Denies chance of pregnancy.       Home Medications Prior to Admission medications   Medication Sig Start Date End Date Taking? Authorizing Provider  albuterol (PROVENTIL) (2.5 MG/3ML) 0.083% nebulizer solution Take 3 mLs (2.5 mg total) by nebulization every 6 (six) hours as needed for wheezing or shortness of breath. 08/14/19   Minna Antis, MD  albuterol (VENTOLIN HFA) 108 (90 Base) MCG/ACT inhaler Inhale 2 puffs into the lungs every 6 (six) hours as needed for wheezing or shortness of breath. 08/14/19   Minna Antis, MD  albuterol (VENTOLIN HFA) 108 (90 Base) MCG/ACT inhaler Inhale 2 puffs into the lungs every 4 (four) hours as needed for wheezing or shortness of breath. 10/21/22 11/20/22  Kyna Blahnik L, PA-C  budesonide-formoterol (SYMBICORT) 160-4.5 MCG/ACT inhaler Inhale 2 puffs into the lungs 2 (two) times daily. 08/13/19 09/12/19  Moshe Cipro, FNP  escitalopram (LEXAPRO) 10 MG tablet Take 10 mg by mouth daily.    [provider]  ipratropium-albuterol (DUONEB) 0.5-2.5 (3) MG/3ML SOLN Take 3 mLs by nebulization every 4 (four) hours as needed. 08/13/19   Moshe Cipro, FNP  levocetirizine (XYZAL) 5 MG tablet Take 1 tablet (5 mg total) by mouth every evening. 08/13/19   Moshe Cipro, FNP  medroxyPROGESTERone (DEPO-PROVERA) 150 MG/ML injection Inject 150 mg into the muscle every 3 (three) months.    [provider]  montelukast  (SINGULAIR) 10 MG tablet Take 1 tablet (10 mg total) by mouth at bedtime. 08/13/19   Moshe Cipro, FNP  ondansetron (ZOFRAN) 4 MG tablet Take 1 tablet (4 mg total) by mouth every 8 (eight) hours as needed for nausea or vomiting. 09/20/18   Tommie Sams, DO  pantoprazole (PROTONIX) 40 MG tablet Take 1 tablet (40 mg total) by mouth daily. 09/20/18   Tommie Sams, DO      Allergies    Fruit & vegetable daily [nutritional supplements] and Latex    Review of Systems   Review of Systems  All other systems reviewed and are negative.   Physical Exam Updated Vital Signs BP (!) 153/92   Pulse (!) 107   Temp 98 F (36.7 C) (Oral)   Resp (!) 23   Ht 5\' 7"  (1.702 m)   Wt 115.2 kg   LMP 10/10/2022 (Exact Date)   SpO2 97%   BMI 39.78 kg/m  Physical Exam Vitals and nursing note reviewed.  Constitutional:      General: She is in acute distress (moderate secondary to dyspnea).     Appearance: Normal appearance. She is not toxic-appearing.  HENT:     Head: Normocephalic and atraumatic.     Mouth/Throat:     Mouth: Mucous membranes are moist.  Eyes:     Conjunctiva/sclera: Conjunctivae normal.  Neck:     Vascular: No JVD.  Cardiovascular:     Rate and Rhythm: Normal rate and regular rhythm.  Heart sounds: No murmur heard. Pulmonary:     Effort: Tachypnea, accessory muscle usage and respiratory distress (mild) present.     Breath sounds: No stridor. Wheezing present. No rhonchi or rales.     Comments: Able to speak in short phrases but obviously dyspneic, normal phonation Chest:     Chest wall: No mass, deformity, tenderness, crepitus or edema.  Abdominal:     General: Abdomen is flat.     Palpations: Abdomen is soft.     Tenderness: There is no abdominal tenderness.  Musculoskeletal:        General: Normal range of motion.     Cervical back: Normal range of motion and neck supple.     Right lower leg: No tenderness. No edema.     Left lower leg: No tenderness. No edema.   Skin:    General: Skin is warm and dry.     Capillary Refill: Capillary refill takes less than 2 seconds.  Neurological:     General: No focal deficit present.     Mental Status: She is alert and oriented to person, place, and time. Mental status is at baseline.  Psychiatric:        Mood and Affect: Mood normal.        Behavior: Behavior normal.     ED Results / Procedures / Treatments   Labs (all labs ordered are listed, but only abnormal results are displayed) Labs Reviewed  BASIC METABOLIC PANEL - Abnormal; Notable for the following components:      Result Value   Potassium 3.4 (*)    Calcium 8.6 (*)    All other components within normal limits  CBC WITH DIFFERENTIAL/PLATELET - Abnormal; Notable for the following components:   WBC 17.2 (*)    Hemoglobin 10.2 (*)    HCT 35.4 (*)    MCV 72.4 (*)    MCH 20.9 (*)    MCHC 28.8 (*)    RDW 16.6 (*)    Platelets 509 (*)    Neutro Abs 15.6 (*)    Abs Immature Granulocytes 0.22 (*)    All other components within normal limits  RESP PANEL BY RT-PCR (RSV, FLU A&B, COVID)  RVPGX2  HCG, SERUM, QUALITATIVE    EKG None  Radiology DG Chest Port 1 View  Result Date: 10/21/2022 CLINICAL DATA:  Shortness of breath. EXAM: PORTABLE CHEST 1 VIEW COMPARISON:  Chest radiograph dated 03/09/2020. FINDINGS: The heart size and mediastinal contours are within normal limits. Both lungs are clear. The visualized skeletal structures are unremarkable. IMPRESSION: No active disease. Electronically Signed   By: Elgie Collard M.D.   On: 10/21/2022 20:04    Procedures Procedures    Medications Ordered in ED Medications  albuterol (PROVENTIL) (2.5 MG/3ML) 0.083% nebulizer solution 15 mg (0 mg Nebulization Stopped 10/21/22 1850)  0.9 %  sodium chloride infusion ( Intravenous New Bag/Given 10/21/22 1805)  albuterol (VENTOLIN HFA) 108 (90 Base) MCG/ACT inhaler 1-2 puff (has no administration in time range)  magnesium sulfate IVPB 2 g 50 mL (0 g  Intravenous Stopped 10/21/22 2047)  methylPREDNISolone sodium succinate (SOLU-MEDROL) 125 mg/2 mL injection 125 mg (125 mg Intravenous Given 10/21/22 1801)  ipratropium-albuterol (DUONEB) 0.5-2.5 (3) MG/3ML nebulizer solution 3 mL (3 mLs Nebulization Given 10/21/22 1958)    ED Course/ Medical Decision Making/ A&P Clinical Course as of 10/21/22 2114  Mon Oct 21, 2022  1900 Patient still with mild wheezing no tachypnea, respiratory distress has resolved.  Maintaining oxygen saturation on  room air, appears more comfortable. [MG]  2015 Patient remains resting comfortably, in no acute distress, wheezing significantly improved, maintaining oxygen saturation on room air, expresses subjective relief of symptoms.  [MG]  2100 Patient's heart rate has improved.  She has very minimal expiratory wheezing, no respiratory distress.  Endorses subjective relief of symptoms.  Suspect asthma exacerbation secondary to viral syndrome.  Patient wants to go home which I believe is reasonable as she has significantly improved.  Will return for any worsening condition.   [MG]    Clinical Course User Index [MG] Tonette Lederer, PA-C                                 Medical Decision Making Amount and/or Complexity of Data Reviewed Labs: ordered. Decision-making details documented in ED Course. Radiology: ordered. Decision-making details documented in ED Course. ECG/medicine tests: ordered. Decision-making details documented in ED Course.  Risk Prescription drug management.   Medical Decision Making:   Eleanor Bryers is a 42 y.o. female who presented to the ED today with dyspnea detailed above.    Patient's presentation is complicated by their history of asthma.  Patient placed on continuous vitals and telemetry monitoring while in ED which was reviewed periodically.  Complete initial physical exam performed, notably the patient was in moderate distress secondary to dyspnea with diffuse wheezing and tachypnea.  No lower  extremity edema.    Reviewed and confirmed nursing documentation for past medical history, family history, social history.    Initial Assessment:   With the patient's presentation, differential diagnosis includes but is not limited to  Cardiac (AHF, pericardial effusion and tamponade, arrhythmias, ischemia, etc) Respiratory (COPD, asthma, pneumonia, pneumothorax, primary pulmonary hypertension, PE/VQ mismatch) Hematological (anemia) Neuromuscular (ALS, Guillain-Barr, etc)  This is most consistent with an acute complicated illness  Initial Plan:  Screening labs including CBC and Metabolic panel to evaluate for infectious or metabolic etiology of disease.  CXR to evaluate for structural/infectious intrathoracic pathology.  EKG to evaluate for cardiac pathology Symptomatic treatment Objective evaluation as below reviewed   Initial Study Results:   Laboratory  All laboratory results reviewed without evidence of clinically relevant pathology.   Exceptions include: WBC 17.2 suspect secondary to chronic prednisone use, potassium 3.4, hemoglobin 10.2, platelets 509  EKG EKG was reviewed independently.  Normal sinus rhythm.  LVH.  No acute ST ST changes.  No STEMI.  Radiology:  All images reviewed independently. Agree with radiology report at this time.   DG Chest Port 1 View  Result Date: 10/21/2022 CLINICAL DATA:  Shortness of breath. EXAM: PORTABLE CHEST 1 VIEW COMPARISON:  Chest radiograph dated 03/09/2020. FINDINGS: The heart size and mediastinal contours are within normal limits. Both lungs are clear. The visualized skeletal structures are unremarkable. IMPRESSION: No active disease. Electronically Signed   By: Elgie Collard M.D.   On: 10/21/2022 20:04      Final Assessment and Plan:   42 year old female presents to the ED with dyspnea.  She has significant wheezing on initial exam, tachypneic, in respiratory distress.  Given continuous nebulizer, Solu-Medrol, magnesium.  Following  this, did have significant improvement in wheezing.  And respiratory distress resolved.  Notes recent URI-like symptoms.  PERC negative.  No chest pain, low suspicion for ACS.  No acute changes on EKG.  Viral swabs negative.  Has a history of poorly controlled asthma.  Has recently stopped smoking as of a few days ago  which was likely contributing.  Chest x-ray without focal consolidation.  On multiple reexaminations, patient with significantly improved wheezing and resolution of respiratory distress.  Maintaining oxygen saturation on room air.  Has been out of inhalers so we will refill.  Discussed with patient importance of continuing tobacco cessation and she is agreeable.  Also discussed importance of close PCP and pulmonology follow-up.  Will have patient increase prednisone over the next few days to help treat asthma exacerbation and avoiding triggers.  Will return for any worsening condition.  Strict ED return precautions given, all questions answered, and stable for discharge.   Clinical Impression:  1. Moderate persistent asthma with exacerbation   2. Viral syndrome      Discharge            Final Clinical Impression(s) / ED Diagnoses Final diagnoses:  Moderate persistent asthma with exacerbation  Viral syndrome    Rx / DC Orders ED Discharge Orders          Ordered    albuterol (VENTOLIN HFA) 108 (90 Base) MCG/ACT inhaler  Every 4 hours PRN,   Status:  Discontinued        10/21/22 2101    albuterol (VENTOLIN HFA) 108 (90 Base) MCG/ACT inhaler  Every 4 hours PRN        10/21/22 2108              Richardson Dopp 10/21/22 2114    Maia Plan, MD 10/21/22 2342

## 2022-10-21 NOTE — Discharge Instructions (Signed)
Thank you for letting us take care of you today.   We treated you for an asthma exacerbation.  Your swabs were negative for COVID, flu, and RSV.  Your chest x-ray did not show pneumonia. I sent in a new prescription for your inhaler. Continue your prednisone at home. We usually recommend increasing this to 40mg  for 5 days when having an asthma flare. Otherwise, you can continue the dose prescribed by your outpatient team. Follow up with your PCP and pulmonologist if your asthma remains uncontrolled. It has likely flared due to a virus or common cold but you may need medication adjustments if it remains poorly controlled.  For new or worsening symptoms, return to nearest ED for re-evaluation.

## 2023-06-19 ENCOUNTER — Inpatient Hospital Stay (HOSPITAL_COMMUNITY)
Admission: EM | Admit: 2023-06-19 | Discharge: 2023-06-29 | DRG: 853 | Disposition: A | Discharge status: Home Health | Attending: Internal Medicine | Admitting: Internal Medicine

## 2023-06-19 ENCOUNTER — Inpatient Hospital Stay (HOSPITAL_COMMUNITY)

## 2023-06-19 ENCOUNTER — Other Ambulatory Visit: Payer: Self-pay

## 2023-06-19 ENCOUNTER — Emergency Department (HOSPITAL_COMMUNITY)

## 2023-06-19 DIAGNOSIS — E872 Acidosis, unspecified: Secondary | ICD-10-CM | POA: Diagnosis present

## 2023-06-19 DIAGNOSIS — D519 Vitamin B12 deficiency anemia, unspecified: Secondary | ICD-10-CM | POA: Diagnosis present

## 2023-06-19 DIAGNOSIS — N170 Acute kidney failure with tubular necrosis: Secondary | ICD-10-CM | POA: Diagnosis not present

## 2023-06-19 DIAGNOSIS — R6520 Severe sepsis without septic shock: Secondary | ICD-10-CM

## 2023-06-19 DIAGNOSIS — R7401 Elevation of levels of liver transaminase levels: Secondary | ICD-10-CM

## 2023-06-19 DIAGNOSIS — I519 Heart disease, unspecified: Secondary | ICD-10-CM

## 2023-06-19 DIAGNOSIS — B951 Streptococcus, group B, as the cause of diseases classified elsewhere: Secondary | ICD-10-CM | POA: Diagnosis not present

## 2023-06-19 DIAGNOSIS — B954 Other streptococcus as the cause of diseases classified elsewhere: Secondary | ICD-10-CM | POA: Diagnosis present

## 2023-06-19 DIAGNOSIS — A409 Streptococcal sepsis, unspecified: Secondary | ICD-10-CM | POA: Diagnosis not present

## 2023-06-19 DIAGNOSIS — L03116 Cellulitis of left lower limb: Secondary | ICD-10-CM | POA: Diagnosis present

## 2023-06-19 DIAGNOSIS — Z6841 Body Mass Index (BMI) 40.0 and over, adult: Secondary | ICD-10-CM

## 2023-06-19 DIAGNOSIS — E8770 Fluid overload, unspecified: Secondary | ICD-10-CM | POA: Diagnosis present

## 2023-06-19 DIAGNOSIS — D509 Iron deficiency anemia, unspecified: Secondary | ICD-10-CM | POA: Diagnosis present

## 2023-06-19 DIAGNOSIS — M5430 Sciatica, unspecified side: Secondary | ICD-10-CM | POA: Diagnosis present

## 2023-06-19 DIAGNOSIS — J45909 Unspecified asthma, uncomplicated: Secondary | ICD-10-CM | POA: Diagnosis present

## 2023-06-19 DIAGNOSIS — R350 Frequency of micturition: Secondary | ICD-10-CM | POA: Diagnosis not present

## 2023-06-19 DIAGNOSIS — R931 Abnormal findings on diagnostic imaging of heart and coronary circulation: Secondary | ICD-10-CM | POA: Diagnosis present

## 2023-06-19 DIAGNOSIS — A419 Sepsis, unspecified organism: Secondary | ICD-10-CM | POA: Diagnosis not present

## 2023-06-19 DIAGNOSIS — G9340 Encephalopathy, unspecified: Secondary | ICD-10-CM

## 2023-06-19 DIAGNOSIS — M71161 Other infective bursitis, right knee: Secondary | ICD-10-CM | POA: Diagnosis present

## 2023-06-19 DIAGNOSIS — L03115 Cellulitis of right lower limb: Secondary | ICD-10-CM | POA: Diagnosis present

## 2023-06-19 DIAGNOSIS — G9341 Metabolic encephalopathy: Secondary | ICD-10-CM | POA: Diagnosis present

## 2023-06-19 DIAGNOSIS — Z87891 Personal history of nicotine dependence: Secondary | ICD-10-CM | POA: Diagnosis not present

## 2023-06-19 DIAGNOSIS — R7881 Bacteremia: Secondary | ICD-10-CM | POA: Diagnosis not present

## 2023-06-19 DIAGNOSIS — F419 Anxiety disorder, unspecified: Secondary | ICD-10-CM | POA: Diagnosis present

## 2023-06-19 DIAGNOSIS — Z5901 Sheltered homelessness: Secondary | ICD-10-CM | POA: Diagnosis not present

## 2023-06-19 DIAGNOSIS — R404 Transient alteration of awareness: Principal | ICD-10-CM

## 2023-06-19 DIAGNOSIS — M25561 Pain in right knee: Secondary | ICD-10-CM | POA: Diagnosis not present

## 2023-06-19 DIAGNOSIS — Z87440 Personal history of urinary (tract) infections: Secondary | ICD-10-CM

## 2023-06-19 DIAGNOSIS — I10 Essential (primary) hypertension: Secondary | ICD-10-CM

## 2023-06-19 DIAGNOSIS — R4182 Altered mental status, unspecified: Secondary | ICD-10-CM | POA: Diagnosis present

## 2023-06-19 DIAGNOSIS — R410 Disorientation, unspecified: Secondary | ICD-10-CM | POA: Diagnosis not present

## 2023-06-19 DIAGNOSIS — E66813 Obesity, class 3: Secondary | ICD-10-CM | POA: Diagnosis present

## 2023-06-19 DIAGNOSIS — Z1152 Encounter for screening for COVID-19: Secondary | ICD-10-CM

## 2023-06-19 DIAGNOSIS — Z888 Allergy status to other drugs, medicaments and biological substances status: Secondary | ICD-10-CM

## 2023-06-19 DIAGNOSIS — E876 Hypokalemia: Secondary | ICD-10-CM | POA: Diagnosis present

## 2023-06-19 DIAGNOSIS — N179 Acute kidney failure, unspecified: Secondary | ICD-10-CM | POA: Diagnosis present

## 2023-06-19 DIAGNOSIS — Z8249 Family history of ischemic heart disease and other diseases of the circulatory system: Secondary | ICD-10-CM

## 2023-06-19 DIAGNOSIS — A400 Sepsis due to streptococcus, group A: Principal | ICD-10-CM

## 2023-06-19 DIAGNOSIS — S80219A Abrasion, unspecified knee, initial encounter: Secondary | ICD-10-CM | POA: Diagnosis present

## 2023-06-19 DIAGNOSIS — Z9851 Tubal ligation status: Secondary | ICD-10-CM

## 2023-06-19 DIAGNOSIS — Z91040 Latex allergy status: Secondary | ICD-10-CM

## 2023-06-19 DIAGNOSIS — M79661 Pain in right lower leg: Secondary | ICD-10-CM | POA: Diagnosis not present

## 2023-06-19 DIAGNOSIS — M7041 Prepatellar bursitis, right knee: Secondary | ICD-10-CM | POA: Diagnosis not present

## 2023-06-19 DIAGNOSIS — I429 Cardiomyopathy, unspecified: Secondary | ICD-10-CM

## 2023-06-19 DIAGNOSIS — B950 Streptococcus, group A, as the cause of diseases classified elsewhere: Secondary | ICD-10-CM | POA: Diagnosis present

## 2023-06-19 DIAGNOSIS — Z79899 Other long term (current) drug therapy: Secondary | ICD-10-CM

## 2023-06-19 DIAGNOSIS — Z789 Other specified health status: Secondary | ICD-10-CM

## 2023-06-19 DIAGNOSIS — Z793 Long term (current) use of hormonal contraceptives: Secondary | ICD-10-CM

## 2023-06-19 DIAGNOSIS — B955 Unspecified streptococcus as the cause of diseases classified elsewhere: Secondary | ICD-10-CM | POA: Diagnosis not present

## 2023-06-19 DIAGNOSIS — M71061 Abscess of bursa, right knee: Secondary | ICD-10-CM | POA: Insufficient documentation

## 2023-06-19 DIAGNOSIS — Z7952 Long term (current) use of systemic steroids: Secondary | ICD-10-CM

## 2023-06-19 DIAGNOSIS — I501 Left ventricular failure: Secondary | ICD-10-CM | POA: Diagnosis not present

## 2023-06-19 DIAGNOSIS — R32 Unspecified urinary incontinence: Secondary | ICD-10-CM | POA: Diagnosis not present

## 2023-06-19 DIAGNOSIS — W2203XA Walked into furniture, initial encounter: Secondary | ICD-10-CM | POA: Diagnosis present

## 2023-06-19 LAB — C-REACTIVE PROTEIN: CRP: 10.3 mg/dL — ABNORMAL HIGH (ref <1.0)

## 2023-06-19 LAB — CBC WITH DIFFERENTIAL/PLATELET
Abs Immature Granulocytes: 0.26 10*3/uL — ABNORMAL HIGH (ref 0.00–0.07)
Basophils Absolute: 0.1 10*3/uL (ref 0.0–0.1)
Basophils Relative: 0 %
Eosinophils Absolute: 0 10*3/uL (ref 0.0–0.5)
Eosinophils Relative: 0 %
HCT: 33.8 % — ABNORMAL LOW (ref 36.0–46.0)
Hemoglobin: 9.4 g/dL — ABNORMAL LOW (ref 12.0–15.0)
Immature Granulocytes: 1 %
Lymphocytes Relative: 6 %
Lymphs Abs: 1.4 10*3/uL (ref 0.7–4.0)
MCH: 19.5 pg — ABNORMAL LOW (ref 26.0–34.0)
MCHC: 27.8 g/dL — ABNORMAL LOW (ref 30.0–36.0)
MCV: 70.1 fL — ABNORMAL LOW (ref 80.0–100.0)
Monocytes Absolute: 1.2 10*3/uL — ABNORMAL HIGH (ref 0.1–1.0)
Monocytes Relative: 5 %
Neutro Abs: 22.9 10*3/uL — ABNORMAL HIGH (ref 1.7–7.7)
Neutrophils Relative %: 88 %
Platelets: 462 10*3/uL — ABNORMAL HIGH (ref 150–400)
RBC: 4.82 MIL/uL (ref 3.87–5.11)
RDW: 18.3 % — ABNORMAL HIGH (ref 11.5–15.5)
Smear Review: NORMAL
WBC: 25.8 10*3/uL — ABNORMAL HIGH (ref 4.0–10.5)
nRBC: 0.3 % — ABNORMAL HIGH (ref 0.0–0.2)

## 2023-06-19 LAB — SYNOVIAL CELL COUNT + DIFF, W/ CRYSTALS
Crystals, Fluid: NONE SEEN
Eosinophils-Synovial: 0 % (ref 0–1)
Lymphocytes-Synovial Fld: 26 % — ABNORMAL HIGH (ref 0–20)
Monocyte-Macrophage-Synovial Fluid: 47 % — ABNORMAL LOW (ref 50–90)
Neutrophil, Synovial: 27 % — ABNORMAL HIGH (ref 0–25)
WBC, Synovial: 40 /mm3 (ref 0–200)

## 2023-06-19 LAB — HCG, SERUM, QUALITATIVE: Preg, Serum: NEGATIVE

## 2023-06-19 LAB — SALICYLATE LEVEL: Salicylate Lvl: <7 mg/dL — ABNORMAL LOW (ref 7.0–30.0)

## 2023-06-19 LAB — HEMOGLOBIN A1C
Hgb A1c MFr Bld: 5.5 % (ref 4.8–5.6)
Mean Plasma Glucose: 111.15 mg/dL

## 2023-06-19 LAB — CBC
HCT: 33.4 % — ABNORMAL LOW (ref 36.0–46.0)
Hemoglobin: 9.3 g/dL — ABNORMAL LOW (ref 12.0–15.0)
MCH: 19.5 pg — ABNORMAL LOW (ref 26.0–34.0)
MCHC: 27.8 g/dL — ABNORMAL LOW (ref 30.0–36.0)
MCV: 69.9 fL — ABNORMAL LOW (ref 80.0–100.0)
Platelets: 376 10*3/uL (ref 150–400)
RBC: 4.78 MIL/uL (ref 3.87–5.11)
RDW: 18.3 % — ABNORMAL HIGH (ref 11.5–15.5)
WBC: 30.7 10*3/uL — ABNORMAL HIGH (ref 4.0–10.5)
nRBC: 0.2 % (ref 0.0–0.2)

## 2023-06-19 LAB — I-STAT VENOUS BLOOD GAS, ED
Acid-Base Excess: 1 mmol/L (ref 0.0–2.0)
Bicarbonate: 24.4 mmol/L (ref 20.0–28.0)
Calcium, Ion: 0.99 mmol/L — ABNORMAL LOW (ref 1.15–1.40)
HCT: 32 % — ABNORMAL LOW (ref 36.0–46.0)
Hemoglobin: 10.9 g/dL — ABNORMAL LOW (ref 12.0–15.0)
O2 Saturation: 66 %
Potassium: 3.2 mmol/L — ABNORMAL LOW (ref 3.5–5.1)
Sodium: 141 mmol/L (ref 135–145)
TCO2: 25 mmol/L (ref 22–32)
pCO2, Ven: 32.8 mmHg — ABNORMAL LOW (ref 44–60)
pH, Ven: 7.48 — ABNORMAL HIGH (ref 7.25–7.43)
pO2, Ven: 31 mmHg — CL (ref 32–45)

## 2023-06-19 LAB — COMPREHENSIVE METABOLIC PANEL WITH GFR
ALT: 48 U/L — ABNORMAL HIGH (ref 0–44)
AST: 47 U/L — ABNORMAL HIGH (ref 15–41)
Albumin: 3.4 g/dL — ABNORMAL LOW (ref 3.5–5.0)
Alkaline Phosphatase: 70 U/L (ref 38–126)
Anion gap: 11 (ref 5–15)
BUN: 14 mg/dL (ref 6–20)
CO2: 24 mmol/L (ref 22–32)
Calcium: 8.1 mg/dL — ABNORMAL LOW (ref 8.9–10.3)
Chloride: 107 mmol/L (ref 98–111)
Creatinine, Ser: 1.51 mg/dL — ABNORMAL HIGH (ref 0.44–1.00)
GFR, Estimated: 44 mL/min — ABNORMAL LOW (ref >60)
Glucose, Bld: 131 mg/dL — ABNORMAL HIGH (ref 70–99)
Potassium: 3.3 mmol/L — ABNORMAL LOW (ref 3.5–5.1)
Sodium: 142 mmol/L (ref 135–145)
Total Bilirubin: 1.1 mg/dL (ref 0.0–1.2)
Total Protein: 6.1 g/dL — ABNORMAL LOW (ref 6.5–8.1)

## 2023-06-19 LAB — RESP PANEL BY RT-PCR (RSV, FLU A&B, COVID)  RVPGX2
Influenza A by PCR: NEGATIVE
Influenza B by PCR: NEGATIVE
Resp Syncytial Virus by PCR: NEGATIVE
SARS Coronavirus 2 by RT PCR: NEGATIVE

## 2023-06-19 LAB — I-STAT CG4 LACTIC ACID, ED
Lactic Acid, Venous: 2.3 mmol/L (ref 0.5–1.9)
Lactic Acid, Venous: 2.5 mmol/L (ref 0.5–1.9)

## 2023-06-19 LAB — MAGNESIUM: Magnesium: 1.8 mg/dL (ref 1.7–2.4)

## 2023-06-19 LAB — LACTIC ACID, PLASMA: Lactic Acid, Venous: 2.2 mmol/L (ref 0.5–1.9)

## 2023-06-19 LAB — CBG MONITORING, ED: Glucose-Capillary: 126 mg/dL — ABNORMAL HIGH (ref 70–99)

## 2023-06-19 LAB — GLUCOSE, CAPILLARY: Glucose-Capillary: 81 mg/dL (ref 70–99)

## 2023-06-19 LAB — CREATININE, SERUM
Creatinine, Ser: 1.58 mg/dL — ABNORMAL HIGH (ref 0.44–1.00)
GFR, Estimated: 42 mL/min — ABNORMAL LOW (ref >60)

## 2023-06-19 LAB — PROTIME-INR
INR: 1.2 (ref 0.8–1.2)
Prothrombin Time: 15.7 s — ABNORMAL HIGH (ref 11.4–15.2)

## 2023-06-19 LAB — SEDIMENTATION RATE: Sed Rate: 3 mm/h (ref 0–22)

## 2023-06-19 LAB — PROCALCITONIN: Procalcitonin: 6.37 ng/mL

## 2023-06-19 LAB — HIV ANTIBODY (ROUTINE TESTING W REFLEX): HIV Screen 4th Generation wRfx: NONREACTIVE

## 2023-06-19 LAB — MRSA NEXT GEN BY PCR, NASAL: MRSA by PCR Next Gen: NOT DETECTED

## 2023-06-19 MED ORDER — POTASSIUM CHLORIDE 10 MEQ/100ML IV SOLN
10.0000 meq | INTRAVENOUS | Status: AC
  Administered 2023-06-19: 10 meq via INTRAVENOUS
  Filled 2023-06-19: qty 100

## 2023-06-19 MED ORDER — HYDROMORPHONE HCL 1 MG/ML IJ SOLN
0.5000 mg | Freq: Once | INTRAMUSCULAR | Status: AC
  Administered 2023-06-19: 0.5 mg via INTRAVENOUS
  Filled 2023-06-19: qty 1

## 2023-06-19 MED ORDER — LACTATED RINGERS IV BOLUS
1000.0000 mL | Freq: Once | INTRAVENOUS | Status: AC
  Administered 2023-06-19: 1000 mL via INTRAVENOUS

## 2023-06-19 MED ORDER — ACETAMINOPHEN 500 MG PO TABS
1000.0000 mg | ORAL_TABLET | Freq: Once | ORAL | Status: DC
  Filled 2023-06-19: qty 2

## 2023-06-19 MED ORDER — VANCOMYCIN HCL 2000 MG/400ML IV SOLN
2000.0000 mg | Freq: Once | INTRAVENOUS | Status: AC
  Administered 2023-06-19: 2000 mg via INTRAVENOUS
  Filled 2023-06-19: qty 400

## 2023-06-19 MED ORDER — HEPARIN SODIUM (PORCINE) 5000 UNIT/ML IJ SOLN
5000.0000 [IU] | Freq: Three times a day (TID) | INTRAMUSCULAR | Status: DC
  Administered 2023-06-19 – 2023-06-21 (×5): 5000 [IU] via SUBCUTANEOUS
  Filled 2023-06-19 (×5): qty 1

## 2023-06-19 MED ORDER — ONDANSETRON HCL 4 MG/2ML IJ SOLN
4.0000 mg | Freq: Once | INTRAMUSCULAR | Status: AC
  Administered 2023-06-19: 4 mg via INTRAVENOUS
  Filled 2023-06-19: qty 2

## 2023-06-19 MED ORDER — IPRATROPIUM-ALBUTEROL 0.5-2.5 (3) MG/3ML IN SOLN
3.0000 mL | RESPIRATORY_TRACT | Status: DC
  Administered 2023-06-19: 3 mL via RESPIRATORY_TRACT
  Filled 2023-06-19: qty 3

## 2023-06-19 MED ORDER — POTASSIUM CHLORIDE 10 MEQ/100ML IV SOLN
10.0000 meq | INTRAVENOUS | Status: AC
  Administered 2023-06-19 – 2023-06-20 (×3): 10 meq via INTRAVENOUS
  Filled 2023-06-19 (×3): qty 100

## 2023-06-19 MED ORDER — IPRATROPIUM-ALBUTEROL 0.5-2.5 (3) MG/3ML IN SOLN: 3.0000 mL | RESPIRATORY_TRACT | Status: DC

## 2023-06-19 MED ORDER — POLYETHYLENE GLYCOL 3350 17 G PO PACK: 17.0000 g | PACK | Freq: Every day | ORAL | Status: DC | PRN

## 2023-06-19 MED ORDER — ACETAMINOPHEN 10 MG/ML IV SOLN
1000.0000 mg | Freq: Four times a day (QID) | INTRAVENOUS | Status: DC | PRN
  Filled 2023-06-19: qty 100

## 2023-06-19 MED ORDER — SODIUM CHLORIDE 0.9% FLUSH: 9.0000 mL | INTRAVENOUS | Status: DC | PRN

## 2023-06-19 MED ORDER — ACETAMINOPHEN 650 MG RE SUPP
650.0000 mg | Freq: Once | RECTAL | Status: AC
  Administered 2023-06-19: 650 mg via RECTAL
  Filled 2023-06-19: qty 1

## 2023-06-19 MED ORDER — SODIUM CHLORIDE 0.9 % IV SOLN
2.0000 g | INTRAVENOUS | Status: DC
  Administered 2023-06-19: 2 g via INTRAVENOUS
  Filled 2023-06-19: qty 20

## 2023-06-19 MED ORDER — DIPHENHYDRAMINE HCL 12.5 MG/5ML PO ELIX: 12.5000 mg | ORAL_SOLUTION | Freq: Four times a day (QID) | ORAL | Status: DC | PRN

## 2023-06-19 MED ORDER — HYDROMORPHONE 1 MG/ML IV SOLN: INTRAVENOUS | Status: DC

## 2023-06-19 MED ORDER — LACTATED RINGERS IV SOLN: INTRAVENOUS | Status: AC

## 2023-06-19 MED ORDER — INSULIN ASPART 100 UNIT/ML IJ SOLN: 0.0000 [IU] | Freq: Every day | INTRAMUSCULAR | Status: DC

## 2023-06-19 MED ORDER — IPRATROPIUM-ALBUTEROL 0.5-2.5 (3) MG/3ML IN SOLN
3.0000 mL | Freq: Four times a day (QID) | RESPIRATORY_TRACT | Status: DC | PRN
  Administered 2023-06-21: 3 mL via RESPIRATORY_TRACT
  Filled 2023-06-19: qty 3

## 2023-06-19 MED ORDER — CALCIUM GLUCONATE-NACL 1-0.675 GM/50ML-% IV SOLN
1.0000 g | Freq: Once | INTRAVENOUS | Status: AC
  Administered 2023-06-19: 1000 mg via INTRAVENOUS
  Filled 2023-06-19: qty 50

## 2023-06-19 MED ORDER — PIPERACILLIN-TAZOBACTAM 3.375 G IVPB 30 MIN
3.3750 g | Freq: Once | INTRAVENOUS | Status: AC
  Administered 2023-06-19: 3.375 g via INTRAVENOUS
  Filled 2023-06-19: qty 50

## 2023-06-19 MED ORDER — NALOXONE HCL 0.4 MG/ML IJ SOLN: 0.4000 mg | INTRAMUSCULAR | Status: DC | PRN

## 2023-06-19 MED ORDER — VANCOMYCIN VARIABLE DOSE PER UNSTABLE RENAL FUNCTION (PHARMACIST DOSING): Status: DC

## 2023-06-19 MED ORDER — INSULIN ASPART 100 UNIT/ML IJ SOLN
0.0000 [IU] | Freq: Three times a day (TID) | INTRAMUSCULAR | Status: DC
  Administered 2023-06-21 (×3): 1 [IU] via SUBCUTANEOUS
  Administered 2023-06-22: 2 [IU] via SUBCUTANEOUS
  Administered 2023-06-24 – 2023-06-27 (×3): 1 [IU] via SUBCUTANEOUS

## 2023-06-19 MED ORDER — SODIUM CHLORIDE 0.9 % IV BOLUS
1000.0000 mL | Freq: Once | INTRAVENOUS | Status: AC
  Administered 2023-06-19: 1000 mL via INTRAVENOUS

## 2023-06-19 MED ORDER — METHYLPREDNISOLONE SODIUM SUCC 125 MG IJ SOLR
60.0000 mg | Freq: Two times a day (BID) | INTRAMUSCULAR | Status: DC
  Filled 2023-06-19: qty 2

## 2023-06-19 MED ORDER — ENOXAPARIN SODIUM 60 MG/0.6ML IJ SOSY: 50.0000 mg | PREFILLED_SYRINGE | INTRAMUSCULAR | Status: DC

## 2023-06-19 MED ORDER — DOCUSATE SODIUM 100 MG PO CAPS: 100.0000 mg | ORAL_CAPSULE | Freq: Two times a day (BID) | ORAL | Status: DC | PRN

## 2023-06-19 MED ORDER — DIPHENHYDRAMINE HCL 50 MG/ML IJ SOLN: 12.5000 mg | Freq: Four times a day (QID) | INTRAMUSCULAR | Status: DC | PRN

## 2023-06-19 MED ORDER — ONDANSETRON HCL 4 MG/2ML IJ SOLN: 4.0000 mg | Freq: Four times a day (QID) | INTRAMUSCULAR | Status: DC | PRN

## 2023-06-19 MED ORDER — HYDROMORPHONE HCL 1 MG/ML IJ SOLN
0.5000 mg | INTRAMUSCULAR | Status: DC | PRN
  Administered 2023-06-19 – 2023-06-20 (×3): 0.5 mg via INTRAVENOUS
  Filled 2023-06-19 (×3): qty 1

## 2023-06-19 MED ORDER — BUDESONIDE 0.25 MG/2ML IN SUSP
0.2500 mg | Freq: Two times a day (BID) | RESPIRATORY_TRACT | Status: DC
  Administered 2023-06-19 – 2023-06-21 (×4): 0.25 mg via RESPIRATORY_TRACT
  Filled 2023-06-19 (×4): qty 2

## 2023-06-19 MED ORDER — SODIUM CHLORIDE 0.9 % IV BOLUS (SEPSIS)
2500.0000 mL | Freq: Once | INTRAVENOUS | Status: AC
  Administered 2023-06-19: 2500 mL via INTRAVENOUS

## 2023-06-19 MED ORDER — LIDOCAINE-EPINEPHRINE (PF) 2 %-1:200000 IJ SOLN
10.0000 mL | Freq: Once | INTRAMUSCULAR | Status: AC
  Administered 2023-06-19: 10 mL via INTRADERMAL
  Filled 2023-06-19: qty 20

## 2023-06-19 NOTE — Sepsis Progress Note (Signed)
 Elink monitoring for the code sepsis protocol.

## 2023-06-19 NOTE — ED Notes (Addendum)
 Critical Care PA and MD at bedside to see patient.

## 2023-06-19 NOTE — Hospital Course (Addendum)
 Fallen? Injured knee?  Found to have temp 103 R knee hurts a lot  Given duonebs for some dyspnea  Tapped knee Code sepsis > broad abx, fluids    Fever causes?  -knee -endocarditis -uti -pulm (cxr ok)  -alcohol withdrawal  -mening/enc   Ddx: Alcohol- Acidosis- Arrhythmia/Cardiac- Endocrine- Electrolytes- Infection- Hypoxia/Hypercarbia- Overdose/Opiates/Sedating meds- Uremia/Urinary Retention- Trauma- Temperature- Thiamine (Wernicke's)- Insulin- Psychiatric- Stroke- Seizure-

## 2023-06-19 NOTE — ED Notes (Signed)
 Unsuccessful of lab draw another phlebotomy will try

## 2023-06-19 NOTE — Sepsis Progress Note (Signed)
 Notified bedside nurse of need to draw repeat lactic acid.

## 2023-06-19 NOTE — H&P (Signed)
 Hospital Admission History and Physical Service Pager: [phone]  Patient name: Roberta Soto Medical record number: [id number] Date of Birth: [birth year] Age: 43 y.o. Gender: female  Primary Care Provider: Patient, No Pcp Per Consultants: N/a Code Status: Full - per daughter [facility] Preferred Emergency Contact:  Name Relation Home Work Mobile  Howard,Samiya Daughter   [phone]   Chief Complaint: AMS  Assessment and Plan: Roberta Soto is a 43 y.o. female with history of asthma presenting with AMS meeting sepsis criteria. Differential for this patient's presentation of this includes:  Infection- Febrile with leukocytosis. R knee infectious appearing. Lactic acid 2.3>2.5. Arrhythmia/Cardiac- EKG ordered. Tachycardic to 140s.  Electrolytes- Hypokalemia 3.3 on arrival, otherwise no significant electrolyte disturbance.   Overdose/Opiates/Sedating meds- UDS pending collection. Has Klonopin on home meds.  Uremia/Urinary Retention- UA pending collection.  Trauma- Unknown if patient fell prior to being found down.  Stroke- CT Head ordered  Assessment & Plan Sepsis  AMS  Hemodynamic instability Patient meets severe sepsis criteria with: Tmax 103.2, leukocytosis 25.8, HR 140s, RR mid-hi 30s, lactic acidosis, possible R knee infectious source. Patient was started on broad spectrum abx and fluids. ED collected R knee fluid aspiration for analysis.   Initially admitted to [facility]. Ordered duonebs, CT Head, lactic acid. Cont fluids, abx. Pending EKG.   Upon reassessment and continued hemodynamic instability, consulted PCCM for further care who will admit patient.   FEN/GI: NPO VTE Prophylaxis: Lovenox  Disposition: Admit to [facility]   History of Present Illness:  Roberta Soto is a 43 y.o. female presenting with AMS after being found down.   HPI per patient's godsister Jorge Newcomer) who is present at bedside and additional family members on the phone: Patient has been  living at hotel for a while now. Last known normal yesterday - was seen by godsister. Had been coughing lately per daughter. Today, hotel found her down, unfortunately unwitnessed. Nothing like this has ever happened before. Family reports asthma and anxiety for her medical hx. Her PCP is Reford Canterbury. Patient may not take klonopin regularly, more as needed. No substance use reported by family.  In the ED, patient met severe sepsis criteria. R knee fluid aspiration collected for aspiration. Patient started on broad spectrum abx and fluids.   Review Of Systems: Per HPI   Pertinent Past Medical History: Asthma HTN  Remainder reviewed in history tab.   Pertinent Past Surgical History: C-sections Remainder reviewed in history tab.   Pertinent Social History: Tobacco use: None per family Alcohol use: None per family Other Substance use: None per family  Lives at a hotel   Pertinent Family History: Mother - HTN, Cancer Brother - Cancer  Remainder reviewed in history tab.   Important Outpatient Medications: Albuterol  prn, duonebs prn  Symbicort  2p BID  Clonazepam 1 TID  Cyclobenzaprine 10 BID Escitalopram 10  Doxycycline ? Gabapentin 400 TID Hydrochlorothiazide 25 daily  Hydroxyzine 50 TID Depo every 3 months Singulair  10 daily  Zofran  prn  Protonix  40 daily  Prednisone  10-20 daily ?? Xyzal  5 daily  Remainder reviewed in medication history.   Objective: BP (!) 160/91   Pulse (!) 147   Temp (!) 103.2 F (39.6 C) (Oral)   Resp (!) 32   Ht 5\' 7"  (1.702 m)   Wt 115 kg   SpO2 (!) 86%   BMI 39.71 kg/m  Exam: General: Intermittently writhing, moaning in pain.  Cardiovascular: Tachycardic S1/S2. NO extra heart sounds. 2+ radial and DP pulses bilaterally.  Respiratory: Noisy breathing. Tachypneic with accessory muscle use. Course breath sounds throughout.  Gastrointestinal: Soft, mildly distended.  Derm/MSK: Erythematous, hot, edematous R knee. 2+ radial and DP pulses.   Neuro: Minimally responsive. Intermittently opens eyes. Pinpoint pupils bilaterally, minimally responsive to light. Unable to follow commands.   Labs:  CBC BMET  Recent Labs  Lab 06/19/23 1349 06/19/23 1402  WBC 25.8*  --   HGB 9.4* 10.9*  HCT 33.8* 32.0*  PLT 462*  --    Recent Labs  Lab 06/19/23 1349 06/19/23 1402  NA 142 141  K 3.3* 3.2*  CL 107  --   CO2 24  --   BUN 14  --   CREATININE 1.51*  --   GLUCOSE 131*  --   CALCIUM 8.1*  --      Lactic Acid: 2.3 > 2.5  Venous Blood Gas result: pCO2 32.8; pH 7.480;  HCO3 24.4  EKG: Ordered   Imaging Studies Performed:  CXR: Interval increase of cardiac silhouette, mildly enlarged. New mild interstitial thickening.   R knee XR: Diffuse subcutaneous fat edema and swelling. No acute fracture or dislocation.   Carey Chapman, MD 06/19/2023, 3:39 PM PGY-1, [facility]  FPTS Intern pager: [phone], text pages welcome Secure chat group [facility] Teaching Service

## 2023-06-19 NOTE — ED Triage Notes (Signed)
 Pt to ed from hotel with a CC of AMS and knee pain.Pt relays fell on knee 1 month ago and did not get treatment Pt has redness and swelling to lower leg. Pt has a GCS 13. HR

## 2023-06-19 NOTE — Sepsis Progress Note (Signed)
 Notified bedside nurse of need to draw repeat lactic acid #3 @ 1928.

## 2023-06-19 NOTE — Assessment & Plan Note (Signed)
 Patient meets severe sepsis criteria with: Tmax 103.2, leukocytosis 25.8, HR 140s, RR mid-hi 30s, lactic acidosis, possible R knee infectious source. Patient was started on broad spectrum abx and fluids. ED collected R knee fluid aspiration for analysis.   Initially admitted to [facility]. Ordered duonebs, CT Head, lactic acid. Cont fluids, abx. Pending EKG.   Upon reassessment and continued hemodynamic instability, consulted PCCM for further care who will admit patient.

## 2023-06-19 NOTE — Assessment & Plan Note (Deleted)
 Asthma: HTN:  GERD:

## 2023-06-19 NOTE — Consult Note (Addendum)
 NAME:  Roberta Soto, MRN:  [id number], DOB:  [birth year], LOS: 0 ADMISSION DATE:  06/19/2023, CONSULTATION DATE:  06/19/23 REFERRING MD:  [facility], CHIEF COMPLAINT:  altered mental status, sepsis    History of Present Illness:  Roberta Soto is a 43 y.o. F with PMH significant for asthma and HTN who presented to the ED with confusion and R knee pain.  She has been quite agitated, tachycardic and tachypneic and not providing any other history, though able to express that she is in severe pain and is following commands.   The R knee is red and swollen, as aspiration was performed in the ED and xray showed edema and swelling.   Labs were significant for WBC 25k, K 3.3, creatinine 1.5, lactic acid 2.5.  She received 2.5L IVF, vanc and zosyn.  She remained tachycardic and tachypneic and pain uncontrolled, so PCCM consulted.  Family at the bedside unaware of any trauma, drug use other than marijuana.   Pertinent  Medical History   has a past medical history of Asthma and Hypertension.   Significant Hospital Events: Including procedures, antibiotic start and stop dates in addition to other pertinent events   5/8 admit with likely septic arthritis of the R knee   Interim History / Subjective:  As above   Objective    Blood pressure (!) 155/105, pulse (!) 145, temperature (!) 103.2 F (39.6 C), temperature source Oral, resp. rate (!) 36, height 5\' 7"  (1.702 m), weight 115 kg, SpO2 (!) 89%.       No intake or output data in the 24 hours ending 06/19/23 1733 Filed Weights   06/19/23 1206  Weight: 115 kg    General:  ill-appearing F, agitated, restless and yelling  HEENT: MM pink/moist Neuro: alert, will answer some questions and follow commands, refuses to give history CV: s1s2 tachycardic regular , no m/r/g PULM:  moving air well bilaterally without wheezing or rhonchi, tachypneic  GI: soft, bsx4 active, tender anywhere palpated on whole body Extremities: warm/dry,  R knee edematous and  erythematous, several superficial pustules draining clear fluid    Resolved Hospital Problem list     Assessment & Plan:   Sepsis with concern for R knee cellulitis vs septic arthritis Uncontrolled pain and encephalopathy Lactic acidosis  -knee joint aspirated in the ED, so far no organisms seen on culture thus far -follow blood cultures -orthopedics consulted, will see patient in the AM -check sed rate and CRP -additional L LR, trend lactic -echo -UDS -Dilaudid PCA  -RPR and GC/Chlamydia pending, HIV neg    AKI Mildly elevated LFT's  Hypokalemia -likely secondary to sepsis, creatinine 1.5 with normal baseline  -trend renal indices and intermittent LFT's -avoid nephrotoxins, monitor UOP -replete K, mag level pending   Asthma with tachypnea and dyspnea -do not currently suspect acute asthma exacerbation, hold steroids -duonebs prn -suspect improved RR and HR with pain control   HTN -hold home antihypertensives for now in the setting of possible developing shock and resume as needed    Best Practice (right click and "Reselect all SmartList Selections" daily)   Diet/type: NPO w/ oral meds DVT prophylaxis prophylactic heparin  Pressure ulcer(s): N/A GI prophylaxis: N/A Lines: N/A Foley:  N/A Code Status:  full code Last date of multidisciplinary goals of care discussion [pending]  Labs   CBC: Recent Labs  Lab 06/19/23 1349 06/19/23 1402  WBC 25.8*  --   NEUTROABS 22.9*  --   HGB 9.4* 10.9*  HCT 33.8* 32.0*  MCV 70.1*  --   PLT 462*  --     Basic Metabolic Panel: Recent Labs  Lab 06/19/23 1349 06/19/23 1402  NA 142 141  K 3.3* 3.2*  CL 107  --   CO2 24  --   GLUCOSE 131*  --   BUN 14  --   CREATININE 1.51*  --   CALCIUM 8.1*  --    GFR: Estimated Creatinine Clearance: 64.2 mL/min (A) (by C-G formula based on SCr of 1.51 mg/dL (H)). Recent Labs  Lab 06/19/23 1349 06/19/23 1402 06/19/23 1728  WBC 25.8*  --   --   LATICACIDVEN  --  2.3*  2.5*    Liver Function Tests: Recent Labs  Lab 06/19/23 1349  AST 47*  ALT 48*  ALKPHOS 70  BILITOT 1.1  PROT 6.1*  ALBUMIN 3.4*   No results for input(s): "LIPASE", "AMYLASE" in the last 168 hours. No results for input(s): "AMMONIA" in the last 168 hours.  ABG    Component Value Date/Time   HCO3 24.4 06/19/2023 1402   TCO2 25 06/19/2023 1402   O2SAT 66 06/19/2023 1402     Coagulation Profile: Recent Labs  Lab 06/19/23 1349  INR 1.2    Cardiac Enzymes: No results for input(s): "CKTOTAL", "CKMB", "CKMBINDEX", "TROPONINI" in the last 168 hours.  HbA1C: No results found for: "HGBA1C"  CBG: Recent Labs  Lab 06/19/23 1211  GLUCAP 126*    Review of Systems:   Unable to obtain   Past Medical History:  She,  has a past medical history of Asthma and Hypertension.   Surgical History:   Past Surgical History:  Procedure Laterality Date   c-section     X4     Social History:   reports that she has quit smoking. Her smoking use included cigarettes. She started smoking about 16 months ago. She has never used smokeless tobacco. She reports that she does not drink alcohol and does not use drugs.   Family History:  Her family history includes Cancer in her brother and mother; Healthy in her father; Hypertension in her mother.   Allergies Allergies  Allergen Reactions   Fruit & Vegetable Daily [Nutritional Supplements]    Latex Rash     Home Medications  Prior to Admission medications   Medication Sig Start Date End Date Taking? Authorizing Provider  albuterol  (PROVENTIL ) (2.5 MG/3ML) 0.083% nebulizer solution Take 3 mLs (2.5 mg total) by nebulization every 6 (six) hours as needed for wheezing or shortness of breath. 08/14/19   Ruth Cove, MD  albuterol  (VENTOLIN  HFA) 108 (90 Base) MCG/ACT inhaler Inhale 2 puffs into the lungs every 6 (six) hours as needed for wheezing or shortness of breath. 08/14/19   Ruth Cove, MD  albuterol  (VENTOLIN  HFA)  108 (90 Base) MCG/ACT inhaler Inhale 2 puffs into the lungs every 4 (four) hours as needed for wheezing or shortness of breath. 10/21/22 06/19/23  Gowens, Mariah L, PA-C  budesonide -formoterol  (SYMBICORT ) 160-4.5 MCG/ACT inhaler Inhale 2 puffs into the lungs 2 (two) times daily. 08/13/19 06/19/23  Wellington Half, FNP  clonazePAM (KLONOPIN) 1 MG tablet Take 1 mg by mouth 3 (three) times daily. 05/05/23   [provider]  [facility] MAGNESIUM  OXIDE 250 MG TABS Take 1 tablet by mouth daily. 01/29/23   [provider]  cyclobenzaprine (FLEXERIL) 10 MG tablet Take 10 mg by mouth 2 (two) times daily. 04/25/23   [provider]  doxycycline (VIBRA-TABS) 100 MG tablet Take 100 mg by  mouth 2 (two) times daily. 04/25/23   [provider]  escitalopram (LEXAPRO) 10 MG tablet Take 10 mg by mouth daily.    [provider]  gabapentin (NEURONTIN) 400 MG capsule Take 400 mg by mouth 3 (three) times daily. 04/25/23   [provider]  hydrochlorothiazide (HYDRODIURIL) 25 MG tablet Take 25 mg by mouth daily. 04/25/23   [provider]  hydrOXYzine (VISTARIL) 50 MG capsule Take 50 mg by mouth 3 (three) times daily. 04/25/23   [provider]  ipratropium-albuterol  (DUONEB) 0.5-2.5 (3) MG/3ML SOLN Take 3 mLs by nebulization every 4 (four) hours as needed. 08/13/19   Wellington Half, FNP  levocetirizine (XYZAL ) 5 MG tablet Take 1 tablet (5 mg total) by mouth every evening. 08/13/19   Wellington Half, FNP  medroxyPROGESTERone (DEPO-PROVERA) 150 MG/ML injection Inject 150 mg into the muscle every 3 (three) months.    [provider]  montelukast  (SINGULAIR ) 10 MG tablet Take 1 tablet (10 mg total) by mouth at bedtime. 08/13/19   Wellington Half, FNP  ondansetron  (ZOFRAN ) 4 MG tablet Take 1 tablet (4 mg total) by mouth every 8 (eight) hours as needed for nausea or vomiting. 09/20/18   Cook, Jayce G, DO  pantoprazole  (PROTONIX ) 40 MG tablet Take 1  tablet (40 mg total) by mouth daily. 09/20/18   Cook, Jayce G, DO  predniSONE  (DELTASONE ) 10 MG tablet Take 10 mg by mouth 3 (three) times daily. 06/12/23   [provider]  predniSONE  (DELTASONE ) 20 MG tablet Take 20 mg by mouth 3 (three) times daily. 06/12/23   [provider]     Critical care time:  45 minutes      CRITICAL CARE Performed by: Patt Boozer Netanya Yazdani   Total critical care time: 45 minutes  Critical care time was exclusive of separately billable procedures and treating other patients.  Critical care was necessary to treat or prevent imminent or life-threatening deterioration.  Critical care was time spent personally by me on the following activities: development of treatment plan with patient and/or surrogate as well as nursing, discussions with consultants, evaluation of patient's response to treatment, examination of patient, obtaining history from patient or surrogate, ordering and performing treatments and interventions, ordering and review of laboratory studies, ordering and review of radiographic studies, pulse oximetry and re-evaluation of patient's condition.    Patt Boozer [place], PA-C [facility] See Amion for pager If no response to pager , please call [phone] until 7pm After 7:00 pm call Elink  [phone]

## 2023-06-19 NOTE — ED Notes (Signed)
 Difficulty obtaining second lactic acid. Phlebotomy asked to see patient.

## 2023-06-19 NOTE — ED Provider Notes (Signed)
 [facility] [place] EMERGENCY DEPARTMENT AT [facility] Provider Note   CSN: [id number] Arrival date & time: 06/19/23  1154     History  Chief Complaint  Patient presents with   Altered Mental Status    Roberta Soto is a 43 y.o. female.  43 yo F with a chief complaints of right knee pain.  The patient was picked up by EMS at a hotel and said that she has been quite confused and seemed to have some difficulty breathing.  She was given a DuoNeb and brought here for evaluation.  The patient is unable or unwilling to provide much history.  Tells me that her knee hurts.   Altered Mental Status      Home Medications Prior to Admission medications   Medication Sig Start Date End Date Taking? Authorizing Provider  albuterol  (PROVENTIL ) (2.5 MG/3ML) 0.083% nebulizer solution Take 3 mLs (2.5 mg total) by nebulization every 6 (six) hours as needed for wheezing or shortness of breath. 08/14/19   Ruth Cove, MD  albuterol  (VENTOLIN  HFA) 108 (90 Base) MCG/ACT inhaler Inhale 2 puffs into the lungs every 6 (six) hours as needed for wheezing or shortness of breath. 08/14/19   Ruth Cove, MD  albuterol  (VENTOLIN  HFA) 108 (90 Base) MCG/ACT inhaler Inhale 2 puffs into the lungs every 4 (four) hours as needed for wheezing or shortness of breath. 10/21/22 06/19/23  Gowens, Mariah L, PA-C  budesonide -formoterol  (SYMBICORT ) 160-4.5 MCG/ACT inhaler Inhale 2 puffs into the lungs 2 (two) times daily. 08/13/19 06/19/23  Wellington Half, FNP  clonazePAM (KLONOPIN) 1 MG tablet Take 1 mg by mouth 3 (three) times daily. 05/05/23   [provider]  [facility] MAGNESIUM  OXIDE 250 MG TABS Take 1 tablet by mouth daily. 01/29/23   [provider]  cyclobenzaprine (FLEXERIL) 10 MG tablet Take 10 mg by mouth 2 (two) times daily. 04/25/23   [provider]  doxycycline (VIBRA-TABS) 100 MG tablet Take 100 mg by mouth 2 (two) times daily. 04/25/23   [provider]  escitalopram  (LEXAPRO) 10 MG tablet Take 10 mg by mouth daily.    [provider]  gabapentin (NEURONTIN) 400 MG capsule Take 400 mg by mouth 3 (three) times daily. 04/25/23   [provider]  hydrochlorothiazide (HYDRODIURIL) 25 MG tablet Take 25 mg by mouth daily. 04/25/23   [provider]  hydrOXYzine (VISTARIL) 50 MG capsule Take 50 mg by mouth 3 (three) times daily. 04/25/23   [provider]  ipratropium-albuterol  (DUONEB) 0.5-2.5 (3) MG/3ML SOLN Take 3 mLs by nebulization every 4 (four) hours as needed. 08/13/19   Wellington Half, FNP  levocetirizine (XYZAL ) 5 MG tablet Take 1 tablet (5 mg total) by mouth every evening. 08/13/19   Wellington Half, FNP  medroxyPROGESTERone (DEPO-PROVERA) 150 MG/ML injection Inject 150 mg into the muscle every 3 (three) months.    [provider]  montelukast  (SINGULAIR ) 10 MG tablet Take 1 tablet (10 mg total) by mouth at bedtime. 08/13/19   Wellington Half, FNP  ondansetron  (ZOFRAN ) 4 MG tablet Take 1 tablet (4 mg total) by mouth every 8 (eight) hours as needed for nausea or vomiting. 09/20/18   Cook, Jayce G, DO  pantoprazole  (PROTONIX ) 40 MG tablet Take 1 tablet (40 mg total) by mouth daily. 09/20/18   Cook, Jayce G, DO  predniSONE  (DELTASONE ) 10 MG tablet Take 10 mg by mouth 3 (three) times daily. 06/12/23   [provider]  predniSONE  (DELTASONE ) 20 MG tablet  Take 20 mg by mouth 3 (three) times daily. 06/12/23   [provider]      Allergies    Fruit & vegetable daily [nutritional supplements] and Latex    Review of Systems   Review of Systems  Physical Exam Updated Vital Signs BP (!) 160/91   Pulse (!) 147   Temp (!) 103.2 F (39.6 C) (Oral)   Resp (!) 32   Ht 5\' 7"  (1.702 m)   Wt 115 kg   SpO2 (!) 86%   BMI 39.71 kg/m  Physical Exam Vitals and nursing note reviewed.  Constitutional:      General: She is not in acute distress.    Appearance: She is well-developed. She is not  diaphoretic.  HENT:     Head: Normocephalic and atraumatic.  Eyes:     Comments: Fake color contacts, and patient compliance make it difficult to assess pupils  Cardiovascular:     Rate and Rhythm: Normal rate and regular rhythm.     Heart sounds: No murmur heard.    No friction rub. No gallop.  Pulmonary:     Effort: Pulmonary effort is normal.     Breath sounds: No wheezing or rales.  Abdominal:     General: There is no distension.     Palpations: Abdomen is soft.     Tenderness: There is no abdominal tenderness.  Musculoskeletal:        General: No tenderness.     Cervical back: Normal range of motion and neck supple.  Skin:    General: Skin is warm and dry.  Neurological:     Mental Status: She is alert and oriented to person, place, and time.  Psychiatric:        Behavior: Behavior normal.     ED Results / Procedures / Treatments   Labs (all labs ordered are listed, but only abnormal results are displayed) Labs Reviewed  COMPREHENSIVE METABOLIC PANEL WITH GFR - Abnormal; Notable for the following components:      Result Value   Potassium 3.3 (*)    Glucose, Bld 131 (*)    Creatinine, Ser 1.51 (*)    Calcium 8.1 (*)    Total Protein 6.1 (*)    Albumin 3.4 (*)    AST 47 (*)    ALT 48 (*)    GFR, Estimated 44 (*)    All other components within normal limits  CBC WITH DIFFERENTIAL/PLATELET - Abnormal; Notable for the following components:   WBC 25.8 (*)    Hemoglobin 9.4 (*)    HCT 33.8 (*)    MCV 70.1 (*)    MCH 19.5 (*)    MCHC 27.8 (*)    RDW 18.3 (*)    Platelets 462 (*)    nRBC 0.3 (*)    Neutro Abs 22.9 (*)    Monocytes Absolute 1.2 (*)    Abs Immature Granulocytes 0.26 (*)    All other components within normal limits  PROTIME-INR - Abnormal; Notable for the following components:   Prothrombin Time 15.7 (*)    All other components within normal limits  SALICYLATE LEVEL - Abnormal; Notable for the following components:   Salicylate Lvl <7.0 (*)     All other components within normal limits  I-STAT CG4 LACTIC ACID, ED - Abnormal; Notable for the following components:   Lactic Acid, Venous 2.3 (*)    All other components within normal limits  I-STAT VENOUS BLOOD GAS, ED - Abnormal; Notable for the  following components:   pH, Ven 7.480 (*)    pCO2, Ven 32.8 (*)    pO2, Ven 31 (*)    Potassium 3.2 (*)    Calcium, Ion 0.99 (*)    HCT 32.0 (*)    Hemoglobin 10.9 (*)    All other components within normal limits  CBG MONITORING, ED - Abnormal; Notable for the following components:   Glucose-Capillary 126 (*)    All other components within normal limits  RESP PANEL BY RT-PCR (RSV, FLU A&B, COVID)  RVPGX2  CULTURE, BLOOD (ROUTINE X 2)  CULTURE, BLOOD (ROUTINE X 2)  BODY FLUID CULTURE W GRAM STAIN  HCG, SERUM, QUALITATIVE  URINALYSIS, W/ REFLEX TO CULTURE (INFECTION SUSPECTED)  RAPID URINE DRUG SCREEN, HOSP PERFORMED  GLUCOSE, BODY FLUID OTHER            PROTEIN, BODY FLUID (OTHER)  SYNOVIAL CELL COUNT + DIFF, W/ CRYSTALS  HIV ANTIBODY (ROUTINE TESTING W REFLEX)  RPR  I-STAT CG4 LACTIC ACID, ED  GC/CHLAMYDIA PROBE AMP ([place]) NOT AT [facility]    EKG None  Radiology DG Chest Port 1 View Result Date: 06/19/2023 CLINICAL DATA:  Questionable sepsis. Evaluate for abnormality. Altered mental status. EXAM: PORTABLE CHEST 1 VIEW COMPARISON:  AP chest 10/21/2022, chest two views 03/09/2020 FINDINGS: There appears to be interval increase in the size the cardiac silhouette, now mildly enlarged. Mediastinal contours are within limits. New mild interstitial thickening. No focal airspace opacity. No pleural effusion pneumothorax. No acute skeletal abnormality. IMPRESSION: Compared to 10/21/2022: 1. Interval increase in the size of the cardiac silhouette, now mildly enlarged. This may be secondary to cardiomegaly and/or a pericardial effusion. 2. New mild interstitial thickening, which may represent mild interstitial pulmonary edema. Electronically  Signed   By: Bertina Broccoli M.D.   On: 06/19/2023 13:34   DG Knee Right Port Result Date: 06/19/2023 CLINICAL DATA:  Altered mental status. Shortness of breath. Redness and swelling. Sore and anterior right knee. Question of sepsis. EXAM: PORTABLE RIGHT KNEE - 1-2 VIEW COMPARISON:  None Available. Moderate quadriceps and patellar tendon FINDINGS: Chronic please like changes at the insertions on the patella. No joint effusion. Minimal medial compartment joint space narrowing. Mild chronic enthesopathic change at the patellar tendon insertion on the tibial tubercle. No acute fracture or dislocation. Diffuse subcutaneous fat edema and swelling, possibly systemic. IMPRESSION: 1. Diffuse subcutaneous fat edema and swelling, possibly systemic. 2. No acute fracture or dislocation. Electronically Signed   By: Bertina Broccoli M.D.   On: 06/19/2023 12:58    Procedures .Critical Care  Performed by: Albertus Hughs, DO Authorized by: Albertus Hughs, DO   Critical care provider statement:    Critical care time (minutes):  80   Critical care time was exclusive of:  Separately billable procedures and treating other patients   Critical care was time spent personally by me on the following activities:  Development of treatment plan with patient or surrogate, discussions with consultants, evaluation of patient's response to treatment, examination of patient, ordering and review of laboratory studies, ordering and review of radiographic studies, ordering and performing treatments and interventions, pulse oximetry, re-evaluation of patient's condition and review of old charts   Care discussed with: admitting provider   .Joint Aspiration/Arthrocentesis  Date/Time: 06/19/2023 3:10 PM  Performed by: Albertus Hughs, DO Authorized by: Albertus Hughs, DO   Consent:    Consent obtained:  Emergent situation and verbal   Risks, benefits, and alternatives were discussed: yes     Risks  discussed:  Bleeding, infection and incomplete drainage    Alternatives discussed:  No treatment and delayed treatment Universal protocol:    Procedure explained and questions answered to patient or proxy's satisfaction: yes     Patient identity confirmed:  Verbally with patient Location:    Location:  Knee   Knee:  R knee Anesthesia:    Anesthesia method:  Local infiltration   Local anesthetic:  Lidocaine 2% WITH epi Procedure details:    Preparation: Patient was prepped and draped in usual sterile fashion     Needle gauge: 21G.   Approach:  Lateral   Aspirate amount:  15   Aspirate characteristics:  Bloody and clear   Steroid injected: no     Specimen collected: yes   Post-procedure details:    Dressing:  Adhesive bandage   Procedure completion:  Tolerated well, no immediate complications     Medications Ordered in ED Medications  vancomycin (VANCOREADY) IVPB 2000 mg/400 mL (2,000 mg Intravenous New Bag/Given 06/19/23 1424)  lactated ringers infusion (has no administration in time range)  sodium chloride  0.9 % bolus 2,500 mL (2,500 mLs Intravenous New Bag/Given 06/19/23 1531)  acetaminophen  (TYLENOL ) tablet 1,000 mg (0 mg Oral Hold 06/19/23 1525)  sodium chloride  0.9 % bolus 1,000 mL (0 mLs Intravenous Stopped 06/19/23 1358)  lidocaine-EPINEPHrine (XYLOCAINE W/EPI) 2 %-1:200000 (PF) injection 10 mL (10 mLs Intradermal Given by Other 06/19/23 1339)  piperacillin-tazobactam (ZOSYN) IVPB 3.375 g (0 g Intravenous Stopped 06/19/23 1425)  HYDROmorphone (DILAUDID) injection 0.5 mg (0.5 mg Intravenous Given 06/19/23 1523)  ondansetron  (ZOFRAN ) injection 4 mg (4 mg Intravenous Given 06/19/23 1537)    ED Course/ Medical Decision Making/ A&P                                 Medical Decision Making Amount and/or Complexity of Data Reviewed Labs: ordered. Radiology: ordered.  Risk OTC drugs. Prescription drug management. Decision regarding hospitalization.   43 yo F with a chief complaints of right knee pain and altered mental status.  There is some  concern from EMS that perhaps the patient had septic arthritis.  She does have pain out of proportion to exam to the right knee.  It looks most consistent with prepatellar bursitis, there is a small papule overlying the likely location of the bursa sac.  I do not appreciate significant erythema around the knee.  There is perhaps some mild red streaking up the medial aspect.  I also do not appreciate much of a joint effusion clinically on exam.  She is having quite a bit of discomfort though.  Will obtain a plain film to assess for free air.  She is also quite tachypneic, confused.  Will obtain blood test.  Chest x-ray.  IV fluids.  Reassess.  Patient found to have a fever of 103.  Will start on broad-spectrum antibiotics.  Leukocytosis. No hypercarbia.   I discussed the case with the patient's family.  They unfortunately do not know what has been going on with her for the past couple days.  Patient continues quite confused quite tachycardic febrile.  Will attempt to admit to medicine.   The patients results and plan were reviewed and discussed.   Any x-rays performed were independently reviewed by myself.   Differential diagnosis were considered with the presenting HPI.  Medications  vancomycin (VANCOREADY) IVPB 2000 mg/400 mL (2,000 mg Intravenous New Bag/Given 06/19/23 1424)  lactated ringers infusion (has  no administration in time range)  sodium chloride  0.9 % bolus 2,500 mL (2,500 mLs Intravenous New Bag/Given 06/19/23 1531)  acetaminophen  (TYLENOL ) tablet 1,000 mg (0 mg Oral Hold 06/19/23 1525)  sodium chloride  0.9 % bolus 1,000 mL (0 mLs Intravenous Stopped 06/19/23 1358)  lidocaine-EPINEPHrine (XYLOCAINE W/EPI) 2 %-1:200000 (PF) injection 10 mL (10 mLs Intradermal Given by Other 06/19/23 1339)  piperacillin-tazobactam (ZOSYN) IVPB 3.375 g (0 g Intravenous Stopped 06/19/23 1425)  HYDROmorphone (DILAUDID) injection 0.5 mg (0.5 mg Intravenous Given 06/19/23 1523)  ondansetron  (ZOFRAN ) injection 4 mg  (4 mg Intravenous Given 06/19/23 1537)    Vitals:   06/19/23 1348 06/19/23 1415 06/19/23 1500 06/19/23 1534  BP:  (!) 150/127 (!) 152/118 (!) 160/91  Pulse:  (!) 147    Resp:  (!) 48 (!) 45 (!) 32  Temp: (!) 103.2 F (39.6 C)     TempSrc: Oral     SpO2:  98%  (!) 86%  Weight:      Height:        Final diagnoses:  Transient alteration of awareness  Sepsis, due to unspecified organism, unspecified whether acute organ dysfunction present [facility])    Admission/ observation were discussed with the admitting physician, patient and/or family and they are comfortable with the plan.          Final Clinical Impression(s) / ED Diagnoses Final diagnoses:  Transient alteration of awareness  Sepsis, due to unspecified organism, unspecified whether acute organ dysfunction present [facility])    Rx / DC Orders ED Discharge Orders     None         Albertus Hughs, DO 06/19/23 1544

## 2023-06-19 NOTE — Progress Notes (Signed)
 eLink Physician-Brief Progress Note Patient Name: Roberta Soto DOB: Jun 03, 1980 MRN: [id number]   Date of Service  06/19/2023  HPI/Events of Note  Patient admitted with sepsis due to left knee cellulitis, she just returned from the OR.  eICU Interventions  New Patient Evaluation. IV-PCA analgesia discontinued due to limited ability of patient to self-administer medications at this time, PRN iv Tylenol  + Dilaudid ordered.        Rosalyn Archambault U Christen Bedoya 06/19/2023, 8:34 PM

## 2023-06-19 NOTE — ED Notes (Signed)
 MD at bedside speaking with patient family via phone call.

## 2023-06-19 NOTE — Progress Notes (Signed)
 Pharmacy Antibiotic Note  Roberta Soto is a 43 y.o. female admitted on 06/19/2023 with sepsis potential knee cellulitis or septic arthritis.  Pharmacy has been consulted for vancomycin dosing.Patient with notable AKI, Scr: 1.5 baseline is around 0.8.   Plan: Vancomycin 2000mg  load given this afternoon, all calculated regimens with current Scr are q24h. Given AKI - will trend Scr with AM BMP to determine if Q24H or Q12H would be most appropriate. If AKI improves, likely q12h given age.  Ceftriaxone per MD.  Follow culture data for de-escalation.  Monitor renal function for dose adjustments as indicated.   Height: 5\' 7"  (170.2 cm) Weight: 115 kg (253 lb 8.5 oz) IBW/kg (Calculated) : 61.6  Temp (24hrs), Avg:102 F (38.9 C), Min:99.6 F (37.6 C), Max:103.2 F (39.6 C)  Recent Labs  Lab 06/19/23 1349 06/19/23 1402 06/19/23 1728  WBC 25.8*  --   --   CREATININE 1.51*  --   --   LATICACIDVEN  --  2.3* 2.5*    Estimated Creatinine Clearance: 64.2 mL/min (A) (by C-G formula based on SCr of 1.51 mg/dL (H)).    Allergies  Allergen Reactions   Fruit & Vegetable Daily [Nutritional Supplements]    Latex Rash    Antimicrobials this admission: Vancomycin 5/8 >>  Ceftriaxone 5/8 >>   Microbiology results: 5/8 BCx:  5/8 Body fluid Cx:  5/8 MRSA PCR:   Thank you for allowing pharmacy to be a part of this patient's care.  Mamie Searles, PharmD, BCCCP  06/19/2023 6:53 PM

## 2023-06-20 ENCOUNTER — Inpatient Hospital Stay (HOSPITAL_COMMUNITY): Admitting: Anesthesiology

## 2023-06-20 ENCOUNTER — Other Ambulatory Visit: Payer: Self-pay

## 2023-06-20 ENCOUNTER — Inpatient Hospital Stay (HOSPITAL_COMMUNITY)

## 2023-06-20 ENCOUNTER — Encounter (HOSPITAL_COMMUNITY): Payer: Self-pay | Admitting: Pulmonary Disease

## 2023-06-20 ENCOUNTER — Encounter (HOSPITAL_COMMUNITY)
Admission: EM | Disposition: A | Discharge status: Home Health | Payer: Self-pay | Source: Home / Self Care | Attending: Internal Medicine

## 2023-06-20 DIAGNOSIS — I10 Essential (primary) hypertension: Secondary | ICD-10-CM | POA: Diagnosis not present

## 2023-06-20 DIAGNOSIS — M25561 Pain in right knee: Secondary | ICD-10-CM

## 2023-06-20 DIAGNOSIS — M71061 Abscess of bursa, right knee: Secondary | ICD-10-CM | POA: Insufficient documentation

## 2023-06-20 DIAGNOSIS — R404 Transient alteration of awareness: Secondary | ICD-10-CM

## 2023-06-20 DIAGNOSIS — Z87891 Personal history of nicotine dependence: Secondary | ICD-10-CM | POA: Diagnosis not present

## 2023-06-20 DIAGNOSIS — R7881 Bacteremia: Secondary | ICD-10-CM

## 2023-06-20 DIAGNOSIS — J45909 Unspecified asthma, uncomplicated: Secondary | ICD-10-CM | POA: Diagnosis not present

## 2023-06-20 DIAGNOSIS — M7041 Prepatellar bursitis, right knee: Secondary | ICD-10-CM | POA: Diagnosis not present

## 2023-06-20 DIAGNOSIS — R6520 Severe sepsis without septic shock: Secondary | ICD-10-CM | POA: Diagnosis not present

## 2023-06-20 DIAGNOSIS — A419 Sepsis, unspecified organism: Secondary | ICD-10-CM | POA: Diagnosis not present

## 2023-06-20 HISTORY — PX: INCISION AND DRAINAGE OF WOUND: SHX1803

## 2023-06-20 LAB — ECHOCARDIOGRAM COMPLETE
Calc EF: 29.3 %
Height: 67 in
MV M vel: 5.15 m/s
MV Peak grad: 106.1 mmHg
Radius: 0.3 cm
S' Lateral: 5.5 cm
Single Plane A2C EF: 34 %
Single Plane A4C EF: 23.1 %
Weight: 4754.88 [oz_av]

## 2023-06-20 LAB — BLOOD CULTURE ID PANEL (REFLEXED) - BCID2

## 2023-06-20 LAB — COMPREHENSIVE METABOLIC PANEL WITH GFR
ALT: 40 U/L (ref 0–44)
AST: 36 U/L (ref 15–41)
Albumin: 2.8 g/dL — ABNORMAL LOW (ref 3.5–5.0)
Alkaline Phosphatase: 63 U/L (ref 38–126)
Anion gap: 13 (ref 5–15)
BUN: 14 mg/dL (ref 6–20)
CO2: 20 mmol/L — ABNORMAL LOW (ref 22–32)
Calcium: 7.7 mg/dL — ABNORMAL LOW (ref 8.9–10.3)
Chloride: 108 mmol/L (ref 98–111)
Creatinine, Ser: 1.53 mg/dL — ABNORMAL HIGH (ref 0.44–1.00)
GFR, Estimated: 44 mL/min — ABNORMAL LOW (ref >60)
Glucose, Bld: 94 mg/dL (ref 70–99)
Potassium: 3.6 mmol/L (ref 3.5–5.1)
Sodium: 141 mmol/L (ref 135–145)
Total Bilirubin: 1.2 mg/dL (ref 0.0–1.2)
Total Protein: 5.4 g/dL — ABNORMAL LOW (ref 6.5–8.1)

## 2023-06-20 LAB — CBC
HCT: 31.9 % — ABNORMAL LOW (ref 36.0–46.0)
Hemoglobin: 8.8 g/dL — ABNORMAL LOW (ref 12.0–15.0)
MCH: 19.2 pg — ABNORMAL LOW (ref 26.0–34.0)
MCHC: 27.6 g/dL — ABNORMAL LOW (ref 30.0–36.0)
MCV: 69.5 fL — ABNORMAL LOW (ref 80.0–100.0)
Platelets: 364 10*3/uL (ref 150–400)
RBC: 4.59 MIL/uL (ref 3.87–5.11)
RDW: 18.4 % — ABNORMAL HIGH (ref 11.5–15.5)
WBC: 41.7 10*3/uL — ABNORMAL HIGH (ref 4.0–10.5)
nRBC: 0.1 % (ref 0.0–0.2)

## 2023-06-20 LAB — RPR: RPR Ser Ql: NONREACTIVE

## 2023-06-20 LAB — GLUCOSE, CAPILLARY
Glucose-Capillary: 73 mg/dL (ref 70–99)
Glucose-Capillary: 75 mg/dL (ref 70–99)
Glucose-Capillary: 104 mg/dL — ABNORMAL HIGH (ref 70–99)
Glucose-Capillary: 129 mg/dL — ABNORMAL HIGH (ref 70–99)

## 2023-06-20 LAB — SYNOVIAL CELL COUNT + DIFF, W/ CRYSTALS
Crystals, Fluid: NONE SEEN
Eosinophils-Synovial: 0 % (ref 0–1)
Lymphocytes-Synovial Fld: 14 % (ref 0–20)
Monocyte-Macrophage-Synovial Fluid: 40 % — ABNORMAL LOW (ref 50–90)
Neutrophil, Synovial: 46 % — ABNORMAL HIGH (ref 0–25)
WBC, Synovial: 53 /mm3 (ref 0–200)

## 2023-06-20 LAB — IRON AND TIBC
Iron: 10 ug/dL — ABNORMAL LOW (ref 28–170)
Saturation Ratios: 3 % — ABNORMAL LOW (ref 10.4–31.8)
TIBC: 398 ug/dL (ref 250–450)
UIBC: 388 ug/dL

## 2023-06-20 LAB — URIC ACID: Uric Acid, Serum: 6.6 mg/dL (ref 2.5–7.1)

## 2023-06-20 LAB — FERRITIN: Ferritin: 24 ng/mL (ref 11–307)

## 2023-06-20 LAB — FOLATE: Folate: 9.9 ng/mL (ref >5.9)

## 2023-06-20 LAB — PHOSPHORUS: Phosphorus: 5.2 mg/dL — ABNORMAL HIGH (ref 2.5–4.6)

## 2023-06-20 LAB — VITAMIN B12: Vitamin B-12: 269 pg/mL (ref 180–914)

## 2023-06-20 LAB — LACTIC ACID, PLASMA: Lactic Acid, Venous: 0.9 mmol/L (ref 0.5–1.9)

## 2023-06-20 LAB — MAGNESIUM: Magnesium: 1.5 mg/dL — ABNORMAL LOW (ref 1.7–2.4)

## 2023-06-20 SURGERY — IRRIGATION AND DEBRIDEMENT WOUND
Anesthesia: General | Site: Knee | Laterality: Right

## 2023-06-20 MED ORDER — VANCOMYCIN HCL IN DEXTROSE 1-5 GM/200ML-% IV SOLN
1000.0000 mg | Freq: Two times a day (BID) | INTRAVENOUS | Status: DC
  Administered 2023-06-20: 1000 mg via INTRAVENOUS
  Filled 2023-06-20: qty 200

## 2023-06-20 MED ORDER — HYDROMORPHONE HCL 1 MG/ML IJ SOLN: 0.7000 mg | INTRAMUSCULAR | Status: DC | PRN

## 2023-06-20 MED ORDER — BUPIVACAINE-EPINEPHRINE (PF) 0.25% -1:200000 IJ SOLN
INTRAMUSCULAR | Status: AC
  Filled 2023-06-20: qty 30

## 2023-06-20 MED ORDER — HYDROMORPHONE HCL 1 MG/ML IJ SOLN
0.5000 mg | Freq: Once | INTRAMUSCULAR | Status: AC
  Administered 2023-06-20: 0.5 mg via INTRAVENOUS
  Filled 2023-06-20: qty 1

## 2023-06-20 MED ORDER — CLONAZEPAM 1 MG PO TABS
1.0000 mg | ORAL_TABLET | Freq: Three times a day (TID) | ORAL | Status: DC
Start: 2023-06-20 — End: 2023-06-23
  Administered 2023-06-20 – 2023-06-23 (×8): 1 mg via ORAL
  Filled 2023-06-20: qty 2
  Filled 2023-06-20: qty 1
  Filled 2023-06-20: qty 2
  Filled 2023-06-20: qty 1
  Filled 2023-06-20 (×3): qty 2
  Filled 2023-06-20: qty 1
  Filled 2023-06-20: qty 2

## 2023-06-20 MED ORDER — FENTANYL CITRATE (PF) 250 MCG/5ML IJ SOLN
INTRAMUSCULAR | Status: AC
  Filled 2023-06-20: qty 5

## 2023-06-20 MED ORDER — OXYCODONE HCL 5 MG PO TABS: 5.0000 mg | ORAL_TABLET | ORAL | Status: DC | PRN

## 2023-06-20 MED ORDER — AMISULPRIDE (ANTIEMETIC) 5 MG/2ML IV SOLN: 10.0000 mg | Freq: Once | INTRAVENOUS | Status: DC | PRN

## 2023-06-20 MED ORDER — POVIDONE-IODINE 10 % EX SWAB
2.0000 | Freq: Once | CUTANEOUS | Status: AC
  Administered 2023-06-20: 2 via TOPICAL

## 2023-06-20 MED ORDER — ACETAMINOPHEN 500 MG PO TABS
1000.0000 mg | ORAL_TABLET | Freq: Once | ORAL | Status: AC
  Administered 2023-06-20: 1000 mg via ORAL
  Filled 2023-06-20: qty 2

## 2023-06-20 MED ORDER — MIDAZOLAM HCL 2 MG/2ML IJ SOLN
INTRAMUSCULAR | Status: AC
Start: 2023-06-20 — End: ?
  Filled 2023-06-20: qty 2

## 2023-06-20 MED ORDER — PROPOFOL 10 MG/ML IV BOLUS
INTRAVENOUS | Status: AC
  Filled 2023-06-20: qty 20

## 2023-06-20 MED ORDER — PROPOFOL 10 MG/ML IV BOLUS
INTRAVENOUS | Status: DC | PRN
  Administered 2023-06-20: 200 mg via INTRAVENOUS

## 2023-06-20 MED ORDER — PENICILLIN G POTASSIUM 20000000 UNITS IJ SOLR
12.0000 10*6.[IU] | Freq: Two times a day (BID) | INTRAVENOUS | Status: DC
  Administered 2023-06-20: 12 10*6.[IU] via INTRAVENOUS
  Filled 2023-06-20 (×2): qty 12

## 2023-06-20 MED ORDER — CHLORHEXIDINE GLUCONATE CLOTH 2 % EX PADS
6.0000 | MEDICATED_PAD | Freq: Every day | CUTANEOUS | Status: DC
  Administered 2023-06-20 – 2023-06-23 (×4): 6 via TOPICAL

## 2023-06-20 MED ORDER — CYCLOBENZAPRINE HCL 10 MG PO TABS
10.0000 mg | ORAL_TABLET | Freq: Three times a day (TID) | ORAL | Status: DC | PRN
  Administered 2023-06-24 – 2023-06-26 (×5): 10 mg via ORAL
  Filled 2023-06-20 (×5): qty 1

## 2023-06-20 MED ORDER — ACETAMINOPHEN 325 MG PO TABS
650.0000 mg | ORAL_TABLET | Freq: Four times a day (QID) | ORAL | Status: DC
  Administered 2023-06-21 – 2023-06-29 (×29): 650 mg via ORAL
  Filled 2023-06-20 (×30): qty 2

## 2023-06-20 MED ORDER — CHLORHEXIDINE GLUCONATE 4 % EX SOLN: 60.0000 mL | Freq: Once | CUTANEOUS | Status: DC

## 2023-06-20 MED ORDER — OXYCODONE HCL 5 MG PO TABS
10.0000 mg | ORAL_TABLET | ORAL | Status: DC | PRN
  Administered 2023-06-21 – 2023-06-29 (×22): 10 mg via ORAL
  Filled 2023-06-20 (×25): qty 2

## 2023-06-20 MED ORDER — PHENYLEPHRINE 80 MCG/ML (10ML) SYRINGE FOR IV PUSH (FOR BLOOD PRESSURE SUPPORT)
PREFILLED_SYRINGE | INTRAVENOUS | Status: DC | PRN
  Administered 2023-06-20 (×2): 160 ug via INTRAVENOUS
  Administered 2023-06-20: 240 ug via INTRAVENOUS
  Administered 2023-06-20: 160 ug via INTRAVENOUS

## 2023-06-20 MED ORDER — OXYCODONE HCL 5 MG/5ML PO SOLN: 5.0000 mg | Freq: Once | ORAL | Status: DC | PRN

## 2023-06-20 MED ORDER — LIDOCAINE 2% (20 MG/ML) 5 ML SYRINGE
INTRAMUSCULAR | Status: AC
  Filled 2023-06-20: qty 5

## 2023-06-20 MED ORDER — MAGNESIUM SULFATE 2 GM/50ML IV SOLN
2.0000 g | Freq: Once | INTRAVENOUS | Status: AC
  Administered 2023-06-20: 2 g via INTRAVENOUS
  Filled 2023-06-20: qty 50

## 2023-06-20 MED ORDER — HYDROMORPHONE HCL 1 MG/ML IJ SOLN
0.7000 mg | INTRAMUSCULAR | Status: DC | PRN
  Administered 2023-06-20 – 2023-06-22 (×5): 0.7 mg via INTRAVENOUS
  Filled 2023-06-20 (×6): qty 1

## 2023-06-20 MED ORDER — ONDANSETRON HCL 4 MG/2ML IJ SOLN
INTRAMUSCULAR | Status: DC | PRN
  Administered 2023-06-20: 4 mg via INTRAVENOUS

## 2023-06-20 MED ORDER — ONDANSETRON HCL 4 MG/2ML IJ SOLN
INTRAMUSCULAR | Status: AC
  Filled 2023-06-20: qty 2

## 2023-06-20 MED ORDER — BUPIVACAINE-EPINEPHRINE (PF) 0.25% -1:200000 IJ SOLN
INTRAMUSCULAR | Status: DC | PRN
  Administered 2023-06-20: 10 mL

## 2023-06-20 MED ORDER — FENTANYL CITRATE (PF) 250 MCG/5ML IJ SOLN
INTRAMUSCULAR | Status: DC | PRN
  Administered 2023-06-20 (×2): 100 ug via INTRAVENOUS
  Administered 2023-06-20: 50 ug via INTRAVENOUS

## 2023-06-20 MED ORDER — OXYCODONE HCL 5 MG PO TABS: 5.0000 mg | ORAL_TABLET | Freq: Once | ORAL | Status: DC | PRN

## 2023-06-20 MED ORDER — MIDAZOLAM HCL 2 MG/2ML IJ SOLN
INTRAMUSCULAR | Status: DC | PRN
  Administered 2023-06-20: 2 mg via INTRAVENOUS

## 2023-06-20 MED ORDER — DEXMEDETOMIDINE HCL IN NACL 80 MCG/20ML IV SOLN
INTRAVENOUS | Status: DC | PRN
  Administered 2023-06-20: 8 ug via INTRAVENOUS
  Administered 2023-06-20: 12 ug via INTRAVENOUS

## 2023-06-20 MED ORDER — LACTATED RINGERS IV SOLN: INTRAVENOUS | Status: DC

## 2023-06-20 MED ORDER — LIDOCAINE 2% (20 MG/ML) 5 ML SYRINGE
INTRAMUSCULAR | Status: DC | PRN
  Administered 2023-06-20: 100 mg via INTRAVENOUS

## 2023-06-20 MED ORDER — ORAL CARE MOUTH RINSE: 15.0000 mL | Freq: Once | OROMUCOSAL | Status: AC

## 2023-06-20 MED ORDER — 0.9 % SODIUM CHLORIDE (POUR BTL) OPTIME
TOPICAL | Status: DC | PRN
  Administered 2023-06-20: 1000 mL

## 2023-06-20 MED ORDER — HYDROMORPHONE HCL 1 MG/ML IJ SOLN
0.5000 mg | INTRAMUSCULAR | Status: DC | PRN
  Administered 2023-06-20: 0.5 mg via INTRAVENOUS
  Filled 2023-06-20: qty 1

## 2023-06-20 MED ORDER — CEFAZOLIN SODIUM-DEXTROSE 3-4 GM/150ML-% IV SOLN
3.0000 g | INTRAVENOUS | Status: AC
  Administered 2023-06-20: 3 g via INTRAVENOUS
  Filled 2023-06-20: qty 150

## 2023-06-20 MED ORDER — POTASSIUM CHLORIDE 10 MEQ/100ML IV SOLN
10.0000 meq | INTRAVENOUS | Status: AC
  Administered 2023-06-20 (×3): 10 meq via INTRAVENOUS
  Filled 2023-06-20 (×3): qty 100

## 2023-06-20 MED ORDER — SODIUM CHLORIDE 0.9 % IR SOLN
Status: DC | PRN
  Administered 2023-06-20 (×2): 3000 mL

## 2023-06-20 MED ORDER — DEXAMETHASONE SODIUM PHOSPHATE 10 MG/ML IJ SOLN
INTRAMUSCULAR | Status: DC | PRN
  Administered 2023-06-20: 10 mg via INTRAVENOUS

## 2023-06-20 MED ORDER — CHLORHEXIDINE GLUCONATE 0.12 % MT SOLN
15.0000 mL | Freq: Once | OROMUCOSAL | Status: AC
  Administered 2023-06-20: 15 mL via OROMUCOSAL
  Filled 2023-06-20: qty 15

## 2023-06-20 MED ORDER — DEXAMETHASONE SODIUM PHOSPHATE 10 MG/ML IJ SOLN
INTRAMUSCULAR | Status: AC
  Filled 2023-06-20: qty 1

## 2023-06-20 MED ORDER — FENTANYL CITRATE (PF) 100 MCG/2ML IJ SOLN: 25.0000 ug | INTRAMUSCULAR | Status: DC | PRN

## 2023-06-20 SURGICAL SUPPLY — 48 items
BAG COUNTER SPONGE SURGICOUNT (BAG) ×1 IMPLANT
BNDG COHESIVE 6X5 TAN ST LF (GAUZE/BANDAGES/DRESSINGS) ×1 IMPLANT
BNDG ELASTIC 6INX 5YD STR LF (GAUZE/BANDAGES/DRESSINGS) IMPLANT
BNDG GAUZE DERMACEA FLUFF 4 (GAUZE/BANDAGES/DRESSINGS) IMPLANT
COVER SURGICAL LIGHT HANDLE (MISCELLANEOUS) ×1 IMPLANT
CUFF TOURN SGL QUICK 42 (TOURNIQUET CUFF) ×1 IMPLANT
CUFF TRNQT CYL 34X4.125X (TOURNIQUET CUFF) IMPLANT
DRAPE IMP U-DRAPE 54X76 (DRAPES) ×1 IMPLANT
DRAPE SURG ORHT 6 SPLT 77X108 (DRAPES) ×1 IMPLANT
ELECT CAUTERY BLADE 6.4 (BLADE) ×2 IMPLANT
ELECTRODE REM PT RTRN 9FT ADLT (ELECTROSURGICAL) ×1 IMPLANT
FACESHIELD WRAPAROUND (MASK) IMPLANT
FACESHIELD WRAPAROUND OR TEAM (MASK) IMPLANT
GAUZE PACKING IODOFORM 1/4X15 (PACKING) IMPLANT
GAUZE PAD ABD 8X10 STRL (GAUZE/BANDAGES/DRESSINGS) IMPLANT
GAUZE SPONGE 4X4 12PLY STRL (GAUZE/BANDAGES/DRESSINGS) IMPLANT
GLOVE BIOGEL PI IND STRL 7.0 (GLOVE) ×2 IMPLANT
GLOVE BIOGEL PI IND STRL 7.5 (GLOVE) ×1 IMPLANT
GLOVE SKINSENSE STRL SZ7.5 (GLOVE) ×4 IMPLANT
GLOVE SURG SYN 7.5 E (GLOVE) ×3 IMPLANT
GLOVE SURG SYN 7.5 PF PI (GLOVE) ×2 IMPLANT
GOWN STRL SURGICAL XL XLNG (GOWN DISPOSABLE) ×2 IMPLANT
KIT BASIN OR (CUSTOM PROCEDURE TRAY) ×1 IMPLANT
KIT TURNOVER KIT B (KITS) ×1 IMPLANT
MANIFOLD NEPTUNE II (INSTRUMENTS) ×1 IMPLANT
NDL HYPO 25GX1X1/2 BEV (NEEDLE) IMPLANT
NDL SPNL 18GX3.5 QUINCKE PK (NEEDLE) IMPLANT
NEEDLE HYPO 25GX1X1/2 BEV (NEEDLE) ×1 IMPLANT
NEEDLE SPNL 18GX3.5 QUINCKE PK (NEEDLE) ×1 IMPLANT
NS IRRIG 1000ML POUR BTL (IV SOLUTION) ×1 IMPLANT
PACK ORTHO EXTREMITY (CUSTOM PROCEDURE TRAY) ×1 IMPLANT
PACK UNIVERSAL I (CUSTOM PROCEDURE TRAY) ×1 IMPLANT
PAD ARMBOARD POSITIONER FOAM (MISCELLANEOUS) ×2 IMPLANT
SET CYSTO W/LG BORE CLAMP LF (SET/KITS/TRAYS/PACK) IMPLANT
SET HNDPC FAN SPRY TIP SCT (DISPOSABLE) ×1 IMPLANT
SPONGE T-LAP 18X18 ~~LOC~~+RFID (SPONGE) ×1 IMPLANT
STOCKINETTE IMPERVIOUS 9X36 MD (GAUZE/BANDAGES/DRESSINGS) ×1 IMPLANT
SUT ETHILON 2 0 FS 18 (SUTURE) ×1 IMPLANT
SUT ETHILON 2 0 PSLX (SUTURE) IMPLANT
SUT MON AB 2-0 CT1 36 (SUTURE) ×1 IMPLANT
SWAB CULTURE ESWAB REG 1ML (MISCELLANEOUS) ×1 IMPLANT
SYR 10ML LL (SYRINGE) IMPLANT
TOWEL GREEN STERILE (TOWEL DISPOSABLE) ×1 IMPLANT
TOWEL GREEN STERILE FF (TOWEL DISPOSABLE) ×1 IMPLANT
TUBE CONNECTING 12X1/4 (SUCTIONS) ×1 IMPLANT
UNDERPAD 30X36 HEAVY ABSORB (UNDERPADS AND DIAPERS) ×1 IMPLANT
WATER STERILE IRR 1000ML POUR (IV SOLUTION) ×1 IMPLANT
YANKAUER SUCT BULB TIP NO VENT (SUCTIONS) ×1 IMPLANT

## 2023-06-20 NOTE — Anesthesia Procedure Notes (Signed)
 Procedure Name: LMA Insertion Date/Time: 06/20/2023 1:11 PM  Performed by: Viki Graver, CRNAPre-anesthesia Checklist: Patient identified, Emergency Drugs available, Suction available and Patient being monitored Patient Re-evaluated:Patient Re-evaluated prior to induction Oxygen Delivery Method: Circle System Utilized Preoxygenation: Pre-oxygenation with 100% oxygen Induction Type: IV induction Ventilation: Mask ventilation without difficulty LMA: LMA inserted LMA Size: 4.0 Number of attempts: 1 Placement Confirmation: positive ETCO2 Tube secured with: Tape Dental Injury: Teeth and Oropharynx as per pre-operative assessment

## 2023-06-20 NOTE — ED Notes (Signed)
 Upon primary assessment of patient, patient confused with labored breathing, unable to follow commands. EDP notified of situation and of patient high heart rate.

## 2023-06-20 NOTE — Anesthesia Postprocedure Evaluation (Signed)
 Anesthesia Post Note  Patient: Roberta Soto  Procedure(s) Performed: IRRIGATION AND DEBRIDEMENT RIGHT KNEE (Right: Knee)     Patient location during evaluation: PACU Anesthesia Type: General Level of consciousness: awake Pain management: pain level controlled Vital Signs Assessment: post-procedure vital signs reviewed and stable Respiratory status: spontaneous breathing, nonlabored ventilation and respiratory function stable Cardiovascular status: blood pressure returned to baseline and stable Postop Assessment: no apparent nausea or vomiting Anesthetic complications: no   No notable events documented.  Last Vitals:  Vitals:   06/20/23 1415 06/20/23 1440  BP: 107/85   Pulse: (!) 106 (!) 109  Resp: 14 17  Temp:  37 C  SpO2: 100% (!) 89%    Last Pain:  Vitals:   06/20/23 1430  TempSrc:   PainSc: Asleep                 Conard Decent

## 2023-06-20 NOTE — Op Note (Signed)
   Date of Surgery: 06/20/2023  INDICATIONS: Roberta Soto is a 43 y.o.-year-old female with a right knee pain and sepsis.  The patient did consent to the procedure after discussion of the risks and benefits.  PREOPERATIVE DIAGNOSIS: Prepatellar septic bursitis, right knee  POSTOPERATIVE DIAGNOSIS: Same.  PROCEDURE:  Incision and drainage of right knee prepatellar bursa and packing with iodoform Aspiration of right knee joint  OPERATIVE FINDINGS: Infection of prepatellar bursa with purulence Joint effusion aspirated. 15 cc of clear synovial fluid sent for GS, cultures, diff  Debridement type: Excisional Debridement  Side: right  Body Location: right prepatellar bursa   Tools used for debridement: scalpel, curette, and rongeur  SURGEON: N. Claria Crofts, M.D.  ASSIST: None  ANESTHESIA:  general  IV FLUIDS AND URINE: See anesthesia.  ESTIMATED BLOOD LOSS: 25 mL.  DRAINS: 1/4 inch iodoform  COMPLICATIONS: see description of procedure.  DESCRIPTION OF PROCEDURE: The patient was brought to the operating room.  The patient had been signed prior to the procedure and this was documented. The patient had the anesthesia placed by the anesthesiologist.  A time-out was performed to confirm that this was the correct patient, site, side and location. The patient did receive antibiotics prior to the incision and was re-dosed during the procedure as needed at indicated intervals.  A tourniquet was placed on the upper thigh.  The patient had the operative extremity prepped and draped in the standard surgical fashion.    A vertical incision was created over the anterior aspect of the knee.  This was taken sharply down into the prepatellar bursa.  There was immediate return of purulence.  This was cultured.  The bursa was then bluntly decompressed with Kelly clamp circumferentially.  There was approximately 5 cc of additional purulence.  Sharp excisional debridement was performed with a curette and  rondure.  After thorough debridement I aspirated the knee through the superior medial approach to avoid the cellulitis and obtained approximately 15 cc of clear synovial fluid.  This was sent for Gram stain, culture, cell count and differential.  Due to the appearance of the fluid I did not feel that the knee joint was septic.  6 L of normal saline was irrigated through the bursa.  Hemostasis was obtained.  This was packed with quarter inch iodoform.  The distal extent of the incision was closed with interrupted 2-0 nylon.  The proximal extent was left open for the packing.  Sterile dressings were applied.  Patient tolerated procedure well had no any complications.   POSTOPERATIVE PLAN: Patient will be admitted back to the medicine service.  We will remove the packing in about 2 to 3 days.  We will follow the intraoperative cultures.  We will follow her clinically.  She is weight-bear as tolerated to the right lower extremity.  Range of motion of the knee as tolerated.   Roberta Drape, MD 1:56 PM

## 2023-06-20 NOTE — Progress Notes (Signed)
 PHARMACY - PHYSICIAN COMMUNICATION CRITICAL VALUE ALERT - BLOOD CULTURE IDENTIFICATION (BCID)  Roberta Soto is an 43 y.o. female who presented to [facility] on 06/19/2023 with sepsis from potential knee cellulitis or septic arthritis  Assessment:  1/3 blood cultures with strep spp, R. knee fluid Cx showing no organisms  Name of physician (or Provider) Contacted: Dr. Marce Sensing  Current antibiotics: Ceftriaxone  2g IV every 24 hours + Vancomycin  variable  Changes to prescribed antibiotics recommended:   -Continue current regimen   Results for orders placed or performed during the hospital encounter of 06/19/23  Blood Culture ID Panel (Reflexed) (Collected: 06/19/2023 12:14 PM)  Result Value Ref Range   Enterococcus faecalis NOT DETECTED NOT DETECTED   Enterococcus Faecium NOT DETECTED NOT DETECTED   Listeria monocytogenes NOT DETECTED NOT DETECTED   Staphylococcus species NOT DETECTED NOT DETECTED   Staphylococcus aureus (BCID) NOT DETECTED NOT DETECTED   Staphylococcus epidermidis NOT DETECTED NOT DETECTED   Staphylococcus lugdunensis NOT DETECTED NOT DETECTED   Streptococcus species DETECTED (A) NOT DETECTED   Streptococcus agalactiae NOT DETECTED NOT DETECTED   Streptococcus pneumoniae NOT DETECTED NOT DETECTED   Streptococcus pyogenes NOT DETECTED NOT DETECTED   A.calcoaceticus-baumannii NOT DETECTED NOT DETECTED   Bacteroides fragilis NOT DETECTED NOT DETECTED   Enterobacterales NOT DETECTED NOT DETECTED   Enterobacter cloacae complex NOT DETECTED NOT DETECTED   Escherichia coli NOT DETECTED NOT DETECTED   Klebsiella aerogenes NOT DETECTED NOT DETECTED   Klebsiella oxytoca NOT DETECTED NOT DETECTED   Klebsiella pneumoniae NOT DETECTED NOT DETECTED   Proteus species NOT DETECTED NOT DETECTED   Salmonella species NOT DETECTED NOT DETECTED   Serratia marcescens NOT DETECTED NOT DETECTED   Haemophilus influenzae NOT DETECTED NOT DETECTED   Neisseria meningitidis NOT DETECTED NOT  DETECTED   Pseudomonas aeruginosa NOT DETECTED NOT DETECTED   Stenotrophomonas maltophilia NOT DETECTED NOT DETECTED   Candida albicans NOT DETECTED NOT DETECTED   Candida auris NOT DETECTED NOT DETECTED   Candida glabrata NOT DETECTED NOT DETECTED   Candida krusei NOT DETECTED NOT DETECTED   Candida parapsilosis NOT DETECTED NOT DETECTED   Candida tropicalis NOT DETECTED NOT DETECTED   Cryptococcus neoformans/gattii NOT DETECTED NOT DETECTED    Young Hensen, PharmD, BCPS Clinical Pharmacist 06/20/2023 7:16 AM

## 2023-06-20 NOTE — H&P (Signed)

## 2023-06-20 NOTE — Progress Notes (Signed)
  Echocardiogram 2D Echocardiogram has been performed.  Roberta Soto 06/20/2023, 5:42 PM

## 2023-06-20 NOTE — Anesthesia Preprocedure Evaluation (Addendum)
 Anesthesia Evaluation  Patient identified by MRN, date of birth, ID band Patient awake    Reviewed: Allergy & Precautions, NPO status , Patient's Chart, lab work & pertinent test results  History of Anesthesia Complications Negative for: history of anesthetic complications  Airway Mallampati: III  TM Distance: >3 FB Neck ROM: Full    Dental  (+) Dental Advisory Given   Pulmonary neg shortness of breath, asthma (had breathing treatment this morning) , neg sleep apnea, neg COPD, neg recent URI, former smoker   Pulmonary exam normal breath sounds clear to auscultation       Cardiovascular hypertension, Pt. on medications (-) angina (-) Past MI, (-) Cardiac Stents and (-) CABG (-) dysrhythmias  Rhythm:Regular Rate:Normal     Neuro/Psych negative neurological ROS     GI/Hepatic Neg liver ROS,GERD  Medicated,,  Endo/Other  neg diabetes  Class 3 obesity  Renal/GU negative Renal ROS     Musculoskeletal   Abdominal  (+) + obese  Peds  Hematology  (+) Blood dyscrasia, anemia Lab Results      Component                Value               Date                      WBC                      41.7 (H)            06/20/2023                HGB                      8.8 (L)             06/20/2023                HCT                      31.9 (L)            06/20/2023                MCV                      69.5 (L)            06/20/2023                PLT                      364                 06/20/2023              Anesthesia Other Findings   Reproductive/Obstetrics                             Anesthesia Physical Anesthesia Plan  ASA: 3  Anesthesia Plan: General   Post-op Pain Management: Tylenol  PO (pre-op)*   Induction: Intravenous  PONV Risk Score and Plan: 3 and Ondansetron , Dexamethasone  and Treatment may vary due to age or medical condition  Airway Management Planned: LMA  Additional  Equipment:   Intra-op Plan:   Post-operative Plan: Extubation in OR  Informed Consent: I have reviewed the  patients History and Physical, chart, labs and discussed the procedure including the risks, benefits and alternatives for the proposed anesthesia with the patient or authorized representative who has indicated his/her understanding and acceptance.     Dental advisory given  Plan Discussed with: CRNA and Anesthesiologist  Anesthesia Plan Comments: (Risks of general anesthesia discussed including, but not limited to, sore throat, hoarse voice, chipped/damaged teeth, injury to vocal cords, nausea and vomiting, allergic reactions, lung infection, heart attack, stroke, and death. All questions answered. )        Anesthesia Quick Evaluation

## 2023-06-20 NOTE — Transfer of Care (Signed)
 Immediate Anesthesia Transfer of Care Note  Patient: Roberta Soto  Procedure(s) Performed: IRRIGATION AND DEBRIDEMENT RIGHT KNEE (Right: Knee)  Patient Location: PACU  Anesthesia Type:General  Level of Consciousness: drowsy  Airway & Oxygen Therapy: Patient Spontanous Breathing and Patient connected to face mask oxygen  Post-op Assessment: Report given to RN and Post -op Vital signs reviewed and stable  Post vital signs: Reviewed and stable  Last Vitals:  Vitals Value Taken Time  BP 89/50 06/20/23 1403  Temp    Pulse 105 06/20/23 1407  Resp 14 06/20/23 1407  SpO2 100 % 06/20/23 1407  Vitals shown include unfiled device data.  Last Pain:  Vitals:   06/20/23 1243  TempSrc:   PainSc: 10-Worst pain ever         Complications: No notable events documented.

## 2023-06-20 NOTE — Progress Notes (Addendum)
 Pharmacy Antibiotic Note  Roberta Soto is a 43 y.o. female admitted on 06/19/2023 with septic knee.  Pharmacy has been consulted for vancomycin  dosing. WBC 41.7 ; LA 2.2 , PCT 6.37   Plan: Vancomycin  2g IV x1 as load given yesterday; start vancomycin  1g IV q 12h -goal trough 15-42mcg/mL Continue ceftriaxone  per MD F/u renal func, LOT, culture data Follow BCX   Height: 5\' 7"  (170.2 cm) Weight: 134.8 kg (297 lb 2.9 oz) IBW/kg (Calculated) : 61.6  Temp (24hrs), Avg:100.7 F (38.2 C), Min:98.4 F (36.9 C), Max:103.2 F (39.6 C)  Recent Labs  Lab 06/19/23 1349 06/19/23 1402 06/19/23 1728 06/19/23 2055 06/20/23 0230  WBC 25.8*  --   --  30.7* 41.7*  CREATININE 1.51*  --   --  1.58* 1.53*  LATICACIDVEN  --  2.3* 2.5* 2.2*  --     Estimated Creatinine Clearance: 69.4 mL/min (A) (by C-G formula based on SCr of 1.53 mg/dL (H)).    Allergies  Allergen Reactions   Fruit & Vegetable Daily [Nutritional Supplements]    Latex Rash    Antimicrobials this admission: Ceftriaxone  5/8 >  Vancomycin  5/8 >    Dose adjustments this admission:   Microbiology results: 5/8 MRSA neg 5/8 RVP, HIV neg  5/8 BCX: 1/3 strep spp 5/8 Knee joint cx: no orgs   Thank you for allowing pharmacy to be a part of this patient's care.  Oralee Billow, PharmD, BCCCP Clinical Pharmacist 06/20/2023 8:00 AM

## 2023-06-20 NOTE — Plan of Care (Signed)

## 2023-06-20 NOTE — Consult Note (Signed)
 Reason for Consult:Right knee pain Referring Physician: Arlina Lair Time called: 0730 Time at bedside: 0954   Roberta Soto is an 43 y.o. female.  HPI: Roberta Soto came to the ED yesterday with severe right knee pain and confusion. She was found to be septic and admitted. She notes some knee pain after scraping her knee on a piece of furniture day before yesterday. She didn't think much of it but it did cause her to limp the rest of the day. When she woke up yesterday she had severe pain and could not bear weight. Orthopedic surgery was consulted. Knee tap was not consistent with septic joint.  Past Medical History:  Diagnosis Date   Asthma    Hypertension     Past Surgical History:  Procedure Laterality Date   c-section     X4    Family History  Problem Relation Age of Onset   Cancer Mother    Hypertension Mother    Cancer Brother    Healthy Father     Social History:  reports that she has quit smoking. Her smoking use included cigarettes. She started smoking about 16 months ago. She has never used smokeless tobacco. She reports that she does not drink alcohol and does not use drugs.  Allergies:  Allergies  Allergen Reactions   Fruit & Vegetable Daily [Nutritional Supplements]    Latex Rash    Medications: I have reviewed the patient's current medications.  Results for orders placed or performed during the hospital encounter of 06/19/23 (from the past 48 hours)  Blood Culture (routine x 2)     Status: None (Preliminary result)   Collection Time: 06/19/23 12:09 PM   Specimen: BLOOD LEFT ARM  Result Value Ref Range   Specimen Description BLOOD LEFT ARM    Special Requests      AEROBIC BOTTLE ONLY Blood Culture results may not be optimal due to an inadequate volume of blood received in culture bottles   Culture      NO GROWTH < 24 HOURS Performed at [facility], [address], [place], [place] [address]    Report Status PENDING   Resp panel by RT-PCR (RSV, Flu  A&B, Covid) Anterior Nasal Swab     Status: None   Collection Time: 06/19/23 12:10 PM   Specimen: Anterior Nasal Swab  Result Value Ref Range   SARS Coronavirus 2 by RT PCR NEGATIVE NEGATIVE   Influenza A by PCR NEGATIVE NEGATIVE   Influenza B by PCR NEGATIVE NEGATIVE    Comment: (NOTE) The Xpert Xpress SARS-CoV-2/FLU/RSV plus assay is intended as an aid in the diagnosis of influenza from Nasopharyngeal swab specimens and should not be used as a sole basis for treatment. Nasal washings and aspirates are unacceptable for Xpert Xpress SARS-CoV-2/FLU/RSV testing.  Fact Sheet for Patients: BloggerCourse.com  Fact Sheet for Healthcare Providers: SeriousBroker.it  This test is not yet approved or cleared by the [place]  FDA and has been authorized for detection and/or diagnosis of SARS-CoV-2 by FDA under an Emergency Use Authorization (EUA). This EUA will remain in effect (meaning this test can be used) for the duration of the COVID-19 declaration under Section 564(b)(1) of the Act, 21 U.S.C. section 360bbb-3(b)(1), unless the authorization is terminated or revoked.     Resp Syncytial Virus by PCR NEGATIVE NEGATIVE    Comment: (NOTE) Fact Sheet for Patients: BloggerCourse.com  Fact Sheet for Healthcare Providers: SeriousBroker.it  This test is not yet approved or cleared by the [place]   FDA and has been authorized for detection and/or diagnosis of SARS-CoV-2 by FDA under an Emergency Use Authorization (EUA). This EUA will remain in effect (meaning this test can be used) for the duration of the COVID-19 declaration under Section 564(b)(1) of the Act, 21 U.S.C. section 360bbb-3(b)(1), unless the authorization is terminated or revoked.  Performed at [facility], [address] [place], [place] [address]   CBG monitoring, ED     Status: Abnormal   Collection Time:  06/19/23 12:11 PM  Result Value Ref Range   Glucose-Capillary 126 (H) 70 - 99 mg/dL    Comment: Glucose reference range applies only to samples taken after fasting for at least 8 hours.  Blood Culture (routine x 2)     Status: None (Preliminary result)   Collection Time: 06/19/23 12:14 PM   Specimen: BLOOD LEFT ARM  Result Value Ref Range   Specimen Description BLOOD LEFT ARM    Special Requests      BOTTLES DRAWN AEROBIC AND ANAEROBIC Blood Culture results may not be optimal due to an inadequate volume of blood received in culture bottles   Culture  Setup Time      GRAM POSITIVE COCCI ANAEROBIC BOTTLE ONLY CRITICAL RESULT CALLED TO, READ BACK BY AND VERIFIED WITH: [facility]  06/20/23 MK Performed at [facility], [address], [place], [place] [address]    Culture GRAM POSITIVE COCCI    Report Status PENDING   Blood Culture ID Panel (Reflexed)     Status: Abnormal   Collection Time: 06/19/23 12:14 PM  Result Value Ref Range   Enterococcus faecalis NOT DETECTED NOT DETECTED   Enterococcus Faecium NOT DETECTED NOT DETECTED   Listeria monocytogenes NOT DETECTED NOT DETECTED   Staphylococcus species NOT DETECTED NOT DETECTED   Staphylococcus aureus (BCID) NOT DETECTED NOT DETECTED   Staphylococcus epidermidis NOT DETECTED NOT DETECTED   Staphylococcus lugdunensis NOT DETECTED NOT DETECTED   Streptococcus species DETECTED (A) NOT DETECTED    Comment: Not Enterococcus species, Streptococcus agalactiae, Streptococcus pyogenes, or Streptococcus pneumoniae. CRITICAL RESULT CALLED TO, READ BACK BY AND VERIFIED WITH: J WYLAND,[email or web address]  06/20/23 MK    Streptococcus agalactiae NOT DETECTED NOT DETECTED   Streptococcus pneumoniae NOT DETECTED NOT DETECTED   Streptococcus pyogenes NOT DETECTED NOT DETECTED   A.calcoaceticus-baumannii NOT DETECTED NOT DETECTED   Bacteroides fragilis NOT DETECTED NOT DETECTED   Enterobacterales NOT DETECTED NOT DETECTED   Enterobacter  cloacae complex NOT DETECTED NOT DETECTED   Escherichia coli NOT DETECTED NOT DETECTED   Klebsiella aerogenes NOT DETECTED NOT DETECTED   Klebsiella oxytoca NOT DETECTED NOT DETECTED   Klebsiella pneumoniae NOT DETECTED NOT DETECTED   Proteus species NOT DETECTED NOT DETECTED   Salmonella species NOT DETECTED NOT DETECTED   Serratia marcescens NOT DETECTED NOT DETECTED   Haemophilus influenzae NOT DETECTED NOT DETECTED   Neisseria meningitidis NOT DETECTED NOT DETECTED   Pseudomonas aeruginosa NOT DETECTED NOT DETECTED   Stenotrophomonas maltophilia NOT DETECTED NOT DETECTED   Candida albicans NOT DETECTED NOT DETECTED   Candida auris NOT DETECTED NOT DETECTED   Candida glabrata NOT DETECTED NOT DETECTED   Candida krusei NOT DETECTED NOT DETECTED   Candida parapsilosis NOT DETECTED NOT DETECTED   Candida tropicalis NOT DETECTED NOT DETECTED   Cryptococcus neoformans/gattii NOT DETECTED NOT DETECTED    Comment: Performed at [facility], [address] [place], [place] [address]  Body fluid culture w Gram Stain     Status: None (Preliminary  result)   Collection Time: 06/19/23  1:02 PM   Specimen: Body Fluid  Result Value Ref Range   Specimen Description FLUID    Special Requests RIGHT KNEE    Gram Stain      RARE WBC PRESENT,BOTH PMN AND MONONUCLEAR NO ORGANISMS SEEN Performed at [facility], [address], [place], [place] [address]    Culture PENDING    Report Status PENDING   Synovial cell count + diff, w/ crystals     Status: Abnormal   Collection Time: 06/19/23  1:30 PM  Result Value Ref Range   Color, Synovial YELLOW YELLOW   Appearance-Synovial HAZY (A) CLEAR   Crystals, Fluid NO CRYSTALS SEEN    WBC, Synovial 40 0 - 200 /cu mm    Comment: COUNT MAY BE INACCURATE DUE TO FIBRIN CLUMPS.   Neutrophil, Synovial 27 (H) 0 - 25 %   Lymphocytes-Synovial Fld 26 (H) 0 - 20 %   Monocyte-Macrophage-Synovial Fluid 47 (L) 50 - 90 %   Eosinophils-Synovial 0 0 - 1 %     Comment: Performed at [facility], [address], [place], [place] [address]  Magnesium      Status: None   Collection Time: 06/19/23  1:48 PM  Result Value Ref Range   Magnesium  1.8 1.7 - 2.4 mg/dL    Comment: Performed at [facility], [address], [place], [place] [address]  Comprehensive metabolic panel     Status: Abnormal   Collection Time: 06/19/23  1:49 PM  Result Value Ref Range   Sodium 142 135 - 145 mmol/L   Potassium 3.3 (L) 3.5 - 5.1 mmol/L   Chloride 107 98 - 111 mmol/L   CO2 24 22 - 32 mmol/L   Glucose, Bld 131 (H) 70 - 99 mg/dL    Comment: Glucose reference range applies only to samples taken after fasting for at least 8 hours.   BUN 14 6 - 20 mg/dL   Creatinine, Ser 9.14 (H) 0.44 - 1.00 mg/dL   Calcium  8.1 (L) 8.9 - 10.3 mg/dL   Total Protein 6.1 (L) 6.5 - 8.1 g/dL   Albumin 3.4 (L) 3.5 - 5.0 g/dL   AST 47 (H) 15 - 41 U/L   ALT 48 (H) 0 - 44 U/L   Alkaline Phosphatase 70 38 - 126 U/L   Total Bilirubin 1.1 0.0 - 1.2 mg/dL   GFR, Estimated 44 (L) >60 mL/min    Comment: (NOTE) Calculated using the CKD-EPI Creatinine Equation (2021)    Anion gap 11 5 - 15    Comment: Performed at [facility], [address], [place], [place] [address]  CBC with Differential     Status: Abnormal   Collection Time: 06/19/23  1:49 PM  Result Value Ref Range   WBC 25.8 (H) 4.0 - 10.5 K/uL   RBC 4.82 3.87 - 5.11 MIL/uL   Hemoglobin 9.4 (L) 12.0 - 15.0 g/dL   HCT 62.1 (L) 30.8 - 65.7 %   MCV 70.1 (L) 80.0 - 100.0 fL   MCH 19.5 (L) 26.0 - 34.0 pg   MCHC 27.8 (L) 30.0 - 36.0 g/dL   RDW 84.6 (H) 96.2 - 95.2 %   Platelets 462 (H) 150 - 400 K/uL   nRBC 0.3 (H) 0.0 - 0.2 %   Neutrophils Relative % 88 %   Neutro Abs 22.9 (H) 1.7 - 7.7 K/uL   Lymphocytes Relative 6 %   Lymphs Abs 1.4 0.7 -  4.0 K/uL   Monocytes Relative 5 %   Monocytes Absolute 1.2 (H) 0.1 - 1.0 K/uL   Eosinophils Relative 0 %   Eosinophils Absolute 0.0 0.0 - 0.5 K/uL   Basophils Relative 0  %   Basophils Absolute 0.1 0.0 - 0.1 K/uL   WBC Morphology MORPHOLOGY UNREMARKABLE    RBC Morphology MORPHOLOGY UNREMARKABLE    Smear Review Normal platelet morphology    Immature Granulocytes 1 %   Abs Immature Granulocytes 0.26 (H) 0.00 - 0.07 K/uL    Comment: Performed at [facility], [address], [place], [place] [address]  Protime-INR     Status: Abnormal   Collection Time: 06/19/23  1:49 PM  Result Value Ref Range   Prothrombin Time 15.7 (H) 11.4 - 15.2 seconds   INR 1.2 0.8 - 1.2    Comment: (NOTE) INR goal varies based on device and disease states. Performed at [facility], [address], [place], [place] [address]   hCG, serum, qualitative     Status: None   Collection Time: 06/19/23  1:49 PM  Result Value Ref Range   Preg, Serum NEGATIVE NEGATIVE    Comment:        THE SENSITIVITY OF THIS METHODOLOGY IS >10 mIU/mL. Performed at [facility], [address], [place], [place] [address]   Salicylate level     Status: Abnormal   Collection Time: 06/19/23  1:49 PM  Result Value Ref Range   Salicylate Lvl <7.0 (L) 7.0 - 30.0 mg/dL    Comment: Performed at [facility], [address] [place], [place] [address]  HIV Antibody (routine testing w rflx)     Status: None   Collection Time: 06/19/23  1:49 PM  Result Value Ref Range   HIV Screen 4th Generation wRfx Non Reactive Non Reactive    Comment: Performed at [facility], [address], [place], [place] [address]  I-Stat Lactic Acid, ED     Status: Abnormal   Collection Time: 06/19/23  2:02 PM  Result Value Ref Range   Lactic Acid, Venous 2.3 (HH) 0.5 - 1.9 mmol/L   Comment NOTIFIED PHYSICIAN   I-Stat venous blood gas, ED ([facility])     Status: Abnormal   Collection Time: 06/19/23  2:02 PM  Result Value Ref Range   pH, Ven 7.480 (H) 7.25 - 7.43   pCO2, Ven 32.8 (L) 44 - 60 mmHg   pO2, Ven 31 (LL) 32 - 45 mmHg   Bicarbonate 24.4 20.0 - 28.0 mmol/L   TCO2 25 22 - 32 mmol/L   O2  Saturation 66 %   Acid-Base Excess 1.0 0.0 - 2.0 mmol/L   Sodium 141 135 - 145 mmol/L   Potassium 3.2 (L) 3.5 - 5.1 mmol/L   Calcium , Ion 0.99 (L) 1.15 - 1.40 mmol/L   HCT 32.0 (L) 36.0 - 46.0 %   Hemoglobin 10.9 (L) 12.0 - 15.0 g/dL   Sample type VENOUS    Comment NOTIFIED PHYSICIAN   I-Stat Lactic Acid, ED     Status: Abnormal   Collection Time: 06/19/23  5:28 PM  Result Value Ref Range   Lactic Acid, Venous 2.5 (HH) 0.5 - 1.9 mmol/L   Comment NOTIFIED PHYSICIAN   MRSA Next Gen by PCR, Nasal     Status: None   Collection Time: 06/19/23  5:56 PM   Specimen: Nasal Mucosa; Nasal Swab  Result Value Ref Range   MRSA by PCR  Next Gen NOT DETECTED NOT DETECTED    Comment: (NOTE) The GeneXpert MRSA Assay (FDA approved for NASAL specimens only), is one component of a comprehensive MRSA colonization surveillance program. It is not intended to diagnose MRSA infection nor to guide or monitor treatment for MRSA infections. Test performance is not FDA approved in patients less than 48 years old. Performed at [facility], [address], [place], [place] [address]   Glucose, capillary     Status: None   Collection Time: 06/19/23  8:23 PM  Result Value Ref Range   Glucose-Capillary 81 70 - 99 mg/dL    Comment: Glucose reference range applies only to samples taken after fasting for at least 8 hours.  Lactic acid, plasma     Status: Abnormal   Collection Time: 06/19/23  8:55 PM  Result Value Ref Range   Lactic Acid, Venous 2.2 (HH) 0.5 - 1.9 mmol/L    Comment: CRITICAL RESULT CALLED TO, READ BACK BY AND VERIFIED WITH WHITE, D. RN @[phone]  06/19/23 SATRAINR Performed at [facility], [address] [place], [place] [address]   Procalcitonin     Status: None   Collection Time: 06/19/23  8:55 PM  Result Value Ref Range   Procalcitonin 6.37 ng/mL    Comment:        Interpretation: PCT > 2 ng/mL: Systemic infection (sepsis) is likely, unless other causes are known. (NOTE)        Sepsis PCT Algorithm           Lower Respiratory Tract                                      Infection PCT Algorithm    ----------------------------     ----------------------------         PCT < 0.25 ng/mL                PCT < 0.10 ng/mL          Strongly encourage             Strongly discourage   discontinuation of antibiotics    initiation of antibiotics    ----------------------------     -----------------------------       PCT 0.25 - 0.50 ng/mL            PCT 0.10 - 0.25 ng/mL               OR       >80% decrease in PCT            Discourage initiation of                                            antibiotics      Encourage discontinuation           of antibiotics    ----------------------------     -----------------------------         PCT >= 0.50 ng/mL              PCT 0.26 - 0.50 ng/mL               AND       <80% decrease in PCT              Encourage  initiation of                                             antibiotics       Encourage continuation           of antibiotics    ----------------------------     -----------------------------        PCT >= 0.50 ng/mL                  PCT > 0.50 ng/mL               AND         increase in PCT                  Strongly encourage                                      initiation of antibiotics    Strongly encourage escalation           of antibiotics                                     -----------------------------                                           PCT <= 0.25 ng/mL                                                 OR                                        > 80% decrease in PCT                                      Discontinue / Do not initiate                                             antibiotics  Performed at [facility], [address], [place], [place] [address]   CBC     Status: Abnormal   Collection Time: 06/19/23  8:55 PM  Result Value Ref Range   WBC 30.7 (H) 4.0 - 10.5 K/uL   RBC 4.78 3.87 - 5.11  MIL/uL   Hemoglobin 9.3 (L) 12.0 - 15.0 g/dL   HCT 60.4 (L) 54.0 - 98.1 %   MCV 69.9 (L) 80.0 - 100.0 fL   MCH 19.5 (L) 26.0 - 34.0 pg   MCHC 27.8 (L) 30.0 - 36.0 g/dL   RDW 19.1 (H) 47.8 - 29.5 %   Platelets 376 150 - 400 K/uL    Comment: REPEATED TO VERIFY   nRBC  0.2 0.0 - 0.2 %    Comment: Performed at [facility], [address], [place], [place] [address]  Creatinine, serum     Status: Abnormal   Collection Time: 06/19/23  8:55 PM  Result Value Ref Range   Creatinine, Ser 1.58 (H) 0.44 - 1.00 mg/dL   GFR, Estimated 42 (L) >60 mL/min    Comment: (NOTE) Calculated using the CKD-EPI Creatinine Equation (2021) Performed at [facility], [address] [place], [place] [address]   Sedimentation rate     Status: None   Collection Time: 06/19/23  8:55 PM  Result Value Ref Range   Sed Rate 3 0 - 22 mm/hr    Comment: Performed at [facility], [address] [place], [place] [address]  C-reactive protein     Status: Abnormal   Collection Time: 06/19/23  8:55 PM  Result Value Ref Range   CRP 10.3 (H) <1.0 mg/dL    Comment: Performed at [facility], [address] [place], [place] [address]  Hemoglobin A1c     Status: None   Collection Time: 06/19/23  8:55 PM  Result Value Ref Range   Hgb A1c MFr Bld 5.5 4.8 - 5.6 %    Comment: (NOTE) Pre diabetes:          5.7%-6.4%  Diabetes:              >6.4%  Glycemic control for   <7.0% adults with diabetes    Mean Plasma Glucose 111.15 mg/dL    Comment: Performed at [facility], [address] [place], [place] [address]  CBC     Status: Abnormal   Collection Time: 06/20/23  2:30 AM  Result Value Ref Range   WBC 41.7 (H) 4.0 - 10.5 K/uL   RBC 4.59 3.87 - 5.11 MIL/uL   Hemoglobin 8.8 (L) 12.0 - 15.0 g/dL    Comment: Reticulocyte Hemoglobin testing may be clinically indicated, consider ordering this additional test [id number]    HCT 31.9 (L) 36.0 - 46.0 %   MCV 69.5 (L) 80.0 - 100.0 fL   MCH  19.2 (L) 26.0 - 34.0 pg   MCHC 27.6 (L) 30.0 - 36.0 g/dL   RDW 72.5 (H) 36.6 - 44.0 %   Platelets 364 150 - 400 K/uL    Comment: REPEATED TO VERIFY   nRBC 0.1 0.0 - 0.2 %    Comment: Performed at [facility], [address] [place], [place] [address]  Magnesium      Status: Abnormal   Collection Time: 06/20/23  2:30 AM  Result Value Ref Range   Magnesium  1.5 (L) 1.7 - 2.4 mg/dL    Comment: Performed at [facility], [address] [place], [place] [address]  Phosphorus     Status: Abnormal   Collection Time: 06/20/23  2:30 AM  Result Value Ref Range   Phosphorus 5.2 (H) 2.5 - 4.6 mg/dL    Comment: Performed at [facility], [address], [place], [place] [address]  Comprehensive metabolic panel     Status: Abnormal   Collection Time: 06/20/23  2:30 AM  Result Value Ref Range   Sodium 141 135 - 145 mmol/L   Potassium 3.6 3.5 - 5.1 mmol/L   Chloride 108 98 - 111 mmol/L   CO2 20 (L) 22 - 32 mmol/L   Glucose, Bld 94 70 - 99 mg/dL    Comment: Glucose reference range  applies only to samples taken after fasting for at least 8 hours.   BUN 14 6 - 20 mg/dL   Creatinine, Ser 1.47 (H) 0.44 - 1.00 mg/dL   Calcium  7.7 (L) 8.9 - 10.3 mg/dL   Total Protein 5.4 (L) 6.5 - 8.1 g/dL   Albumin 2.8 (L) 3.5 - 5.0 g/dL   AST 36 15 - 41 U/L   ALT 40 0 - 44 U/L   Alkaline Phosphatase 63 38 - 126 U/L   Total Bilirubin 1.2 0.0 - 1.2 mg/dL   GFR, Estimated 44 (L) >60 mL/min    Comment: (NOTE) Calculated using the CKD-EPI Creatinine Equation (2021)    Anion gap 13 5 - 15    Comment: Performed at [facility], [address], [place], [place] [address]  Iron and TIBC     Status: Abnormal   Collection Time: 06/20/23  2:30 AM  Result Value Ref Range   Iron 10 (L) 28 - 170 ug/dL   TIBC 829 562 - 130 ug/dL   Saturation Ratios 3 (L) 10.4 - 31.8 %   UIBC 388 ug/dL    Comment: Performed at [facility], [address] [place], [place] [address]  Ferritin     Status:  None   Collection Time: 06/20/23  2:30 AM  Result Value Ref Range   Ferritin 24 11 - 307 ng/mL    Comment: Performed at [facility], [address], [place], [place] [address]  Vitamin B12     Status: None   Collection Time: 06/20/23  2:30 AM  Result Value Ref Range   Vitamin B-12 269 180 - 914 pg/mL    Comment: (NOTE) This assay is not validated for testing neonatal or myeloproliferative syndrome specimens for Vitamin B12 levels. Performed at [facility], [address], [place], [place] [address]   Folate     Status: None   Collection Time: 06/20/23  2:30 AM  Result Value Ref Range   Folate 9.9 >5.9 ng/mL    Comment: Performed at [facility], [address], [place], [place] [address]  Glucose, capillary     Status: None   Collection Time: 06/20/23  7:30 AM  Result Value Ref Range   Glucose-Capillary 73 70 - 99 mg/dL    Comment: Glucose reference range applies only to samples taken after fasting for at least 8 hours.    DG Chest Port 1 View Result Date: 06/19/2023 CLINICAL DATA:  Questionable sepsis. Evaluate for abnormality. Altered mental status. EXAM: PORTABLE CHEST 1 VIEW COMPARISON:  AP chest 10/21/2022, chest two views 03/09/2020 FINDINGS: There appears to be interval increase in the size the cardiac silhouette, now mildly enlarged. Mediastinal contours are within limits. New mild interstitial thickening. No focal airspace opacity. No pleural effusion pneumothorax. No acute skeletal abnormality. IMPRESSION: Compared to 10/21/2022: 1. Interval increase in the size of the cardiac silhouette, now mildly enlarged. This may be secondary to cardiomegaly and/or a pericardial effusion. 2. New mild interstitial thickening, which may represent mild interstitial pulmonary edema. Electronically Signed   By: Bertina Broccoli M.D.   On: 06/19/2023 13:34   DG Knee Right Port Result Date: 06/19/2023 CLINICAL DATA:  Altered mental status. Shortness of breath. Redness and  swelling. Sore and anterior right knee. Question of sepsis. EXAM: PORTABLE RIGHT KNEE - 1-2 VIEW COMPARISON:  None Available. Moderate quadriceps and patellar tendon FINDINGS: Chronic please like changes at the insertions on the patella. No  joint effusion. Minimal medial compartment joint space narrowing. Mild chronic enthesopathic change at the patellar tendon insertion on the tibial tubercle. No acute fracture or dislocation. Diffuse subcutaneous fat edema and swelling, possibly systemic. IMPRESSION: 1. Diffuse subcutaneous fat edema and swelling, possibly systemic. 2. No acute fracture or dislocation. Electronically Signed   By: Bertina Broccoli M.D.   On: 06/19/2023 12:58    Review of Systems  Constitutional:  Positive for fever. Negative for chills and diaphoresis.  HENT:  Negative for ear discharge, ear pain, hearing loss and tinnitus.   Eyes:  Negative for photophobia and pain.  Respiratory:  Negative for cough and shortness of breath.   Cardiovascular:  Negative for chest pain.  Gastrointestinal:  Negative for abdominal pain, nausea and vomiting.  Genitourinary:  Negative for dysuria, flank pain, frequency and urgency.  Musculoskeletal:  Positive for arthralgias (Right knee). Negative for back pain, myalgias and neck pain.  Neurological:  Negative for dizziness and headaches.  Hematological:  Does not bruise/bleed easily.  Psychiatric/Behavioral:  The patient is not nervous/anxious.    Blood pressure 110/71, pulse (!) 114, temperature 98.4 F (36.9 C), temperature source Axillary, resp. rate 14, height 5\' 7"  (1.702 m), weight 134.8 kg, SpO2 98%. Physical Exam Constitutional:      General: She is not in acute distress.    Appearance: She is well-developed. She is not diaphoretic.     Comments: Mildly somnolent  HENT:     Head: Normocephalic and atraumatic.  Eyes:     General: No scleral icterus.       Right eye: No discharge.        Left eye: No discharge.     Conjunctiva/sclera:  Conjunctivae normal.  Cardiovascular:     Rate and Rhythm: Normal rate and regular rhythm.  Pulmonary:     Effort: Pulmonary effort is normal. No respiratory distress.  Musculoskeletal:     Cervical back: Normal range of motion.     Comments: RLE No traumatic wounds, ecchymosis, or rash  Severe TTP w/ant knee bogginess. Patchy erythema around knee. Severe pain with AROM/PROM. Small lesion ant knee with purulent discharge, no odor.  No knee or ankle effusion  Sens DPN, SPN, TN intact  Motor EHL, ext, flex, evers 5/5  DP 1+, PT 1+, No significant edema  Skin:    General: Skin is warm and dry.  Psychiatric:        Mood and Affect: Mood normal.        Behavior: Behavior normal.     Assessment/Plan: Right knee septic bursitis -- Plan I&D in OR with Dr. Christiane Cowing later today. Please keep NPO.    Georganna Kin, PA-C Orthopedic Surgery [phone] 06/20/2023, 10:02 AM

## 2023-06-20 NOTE — Progress Notes (Signed)
 NAME:  Roberta Soto, MRN:  [id number], DOB:  1980-10-25, LOS: 1 ADMISSION DATE:  06/19/2023, CONSULTATION DATE:  06/20/23 REFERRING MD:  FMTS, CHIEF COMPLAINT:  altered mental status, sepsis    History of Present Illness:  Roberta Soto is a 43 y.o. F with PMH significant for asthma and HTN who presented to the ED with confusion and R knee pain.  She has been quite agitated, tachycardic and tachypneic and not providing any other history, though able to express that she is in severe pain and is following commands.   The R knee is red and swollen, as aspiration was performed in the ED and xray showed edema and swelling.   Labs were significant for WBC 25k, K 3.3, creatinine 1.5, lactic acid 2.5.  She received 2.5L IVF, vanc and zosyn .  She remained tachycardic and tachypneic and pain uncontrolled, so PCCM consulted.  Family at the bedside unaware of any trauma, drug use other than marijuana.   Pertinent  Medical History   has a past medical history of Asthma and Hypertension.   Significant Hospital Events: Including procedures, antibiotic start and stop dates in addition to other pertinent events   5/8 admit with likely septic arthritis of the R knee   Interim History / Subjective:  As above   Objective    Blood pressure 116/83, pulse (!) 108, temperature 98.4 F (36.9 C), temperature source Axillary, resp. rate (!) 25, height 5\' 7"  (1.702 m), weight 134.8 kg, SpO2 93%.        Intake/Output Summary (Last 24 hours) at 06/20/2023 0742 Last data filed at 06/20/2023 0600 Gross per 24 hour  Intake 2481.32 ml  Output --  Net 2481.32 ml   Filed Weights   06/19/23 1206 06/20/23 0500  Weight: 115 kg 134.8 kg    General:  Well appearing female, in pain with movement HEENT: MM pink/moist Neuro: alert, will answer some questions and follow commands, refuses to give history CV: s1s2 tachycardic regular , no m/r/g PULM:  moving air well bilaterally without wheezing or rhonchi, tachypneic  GI:  soft, bsx4 active, tender anywhere palpated on whole body Extremities: warm/dry,  R knee edematous and erythematous, several superficial pustules draining clear fluid    Resolved Hospital Problem list     Assessment & Plan:   Sepsis 2/2 to R Knee Infection Strep Species Bacteremia Sepsis symptoms seem to have resolved, however she is still having significant pain in her right knee, with erythema streaking up her right leg. She does state that she fell and scraped her knee about four months ago. Ortho on board, plan for OR Incision and drainage as she doesn't want to do bedside.   Plan:  - Ortho will take to OR at 12:30 - Check Uric Acid - Continue Ceftriaxone  and Vancomycin   - Will get CT of Knee to rule out nec/fasc - Pain control with tylenol , oxycodone, and dilaudid  for breakthrough    AKI Mildly elevated LFT's  Hypokalemia -likely secondary to sepsis, creatinine 1.5 with normal baseline  -trend renal indices and intermittent LFT's -avoid nephrotoxins, monitor UOP -replete K, mag level pending   Asthma with tachypnea and dyspnea -do not currently suspect acute asthma exacerbation, hold steroids -duonebs prn - Suspect chronic steroid use is reason for elevated WBC, but will add on diff -suspect improved RR and HR with pain control   HTN -hold home antihypertensives for now in the setting of possible developing shock and resume as needed    Best Practice (right  click and "Reselect all SmartList Selections" daily)   Diet/type: NPO w/ oral meds DVT prophylaxis prophylactic heparin   Pressure ulcer(s): N/A GI prophylaxis: N/A Lines: N/A Foley:  N/A Code Status:  full code Last date of multidisciplinary goals of care discussion [pending]  Labs   CBC: Recent Labs  Lab 06/19/23 1349 06/19/23 1402 06/19/23 2055 06/20/23 0230  WBC 25.8*  --  30.7* 41.7*  NEUTROABS 22.9*  --   --   --   HGB 9.4* 10.9* 9.3* 8.8*  HCT 33.8* 32.0* 33.4* 31.9*  MCV 70.1*  --  69.9*  69.5*  PLT 462*  --  376 364    Basic Metabolic Panel: Recent Labs  Lab 06/19/23 1348 06/19/23 1349 06/19/23 1402 06/19/23 2055 06/20/23 0230  NA  --  142 141  --  141  K  --  3.3* 3.2*  --  3.6  CL  --  107  --   --  108  CO2  --  24  --   --  20*  GLUCOSE  --  131*  --   --  94  BUN  --  14  --   --  14  CREATININE  --  1.51*  --  1.58* 1.53*  CALCIUM   --  8.1*  --   --  7.7*  MG 1.8  --   --   --  1.5*  PHOS  --   --   --   --  5.2*   GFR: Estimated Creatinine Clearance: 69.4 mL/min (A) (by C-G formula based on SCr of 1.53 mg/dL (H)). Recent Labs  Lab 06/19/23 1349 06/19/23 1402 06/19/23 1728 06/19/23 2055 06/20/23 0230  PROCALCITON  --   --   --  6.37  --   WBC 25.8*  --   --  30.7* 41.7*  LATICACIDVEN  --  2.3* 2.5* 2.2*  --     Liver Function Tests: Recent Labs  Lab 06/19/23 1349 06/20/23 0230  AST 47* 36  ALT 48* 40  ALKPHOS 70 63  BILITOT 1.1 1.2  PROT 6.1* 5.4*  ALBUMIN 3.4* 2.8*   No results for input(s): "LIPASE", "AMYLASE" in the last 168 hours. No results for input(s): "AMMONIA" in the last 168 hours.  ABG    Component Value Date/Time   HCO3 24.4 06/19/2023 1402   TCO2 25 06/19/2023 1402   O2SAT 66 06/19/2023 1402     Coagulation Profile: Recent Labs  Lab 06/19/23 1349  INR 1.2    Cardiac Enzymes: No results for input(s): "CKTOTAL", "CKMB", "CKMBINDEX", "TROPONINI" in the last 168 hours.  HbA1C: Hgb A1c MFr Bld  Date/Time Value Ref Range Status  06/19/2023 08:55 PM 5.5 4.8 - 5.6 % Final    Comment:    (NOTE) Pre diabetes:          5.7%-6.4%  Diabetes:              >6.4%  Glycemic control for   <7.0% adults with diabetes     CBG: Recent Labs  Lab 06/19/23 1211 06/19/23 2023 06/20/23 0730  GLUCAP 126* 81 73    Review of Systems:   Unable to obtain   Past Medical History:  She,  has a past medical history of Asthma and Hypertension.   Surgical History:   Past Surgical History:  Procedure Laterality Date    c-section     X4     Social History:   reports that she has quit smoking. Her smoking  use included cigarettes. She started smoking about 16 months ago. She has never used smokeless tobacco. She reports that she does not drink alcohol and does not use drugs.   Family History:  Her family history includes Cancer in her brother and mother; Healthy in her father; Hypertension in her mother.   Allergies Allergies  Allergen Reactions   Fruit & Vegetable Daily [Nutritional Supplements]    Latex Rash     Home Medications  Prior to Admission medications   Medication Sig Start Date End Date Taking? Authorizing Provider  albuterol  (PROVENTIL ) (2.5 MG/3ML) 0.083% nebulizer solution Take 3 mLs (2.5 mg total) by nebulization every 6 (six) hours as needed for wheezing or shortness of breath. 08/14/19   Ruth Cove, MD  albuterol  (VENTOLIN  HFA) 108 (90 Base) MCG/ACT inhaler Inhale 2 puffs into the lungs every 6 (six) hours as needed for wheezing or shortness of breath. 08/14/19   Ruth Cove, MD  albuterol  (VENTOLIN  HFA) 108 (90 Base) MCG/ACT inhaler Inhale 2 puffs into the lungs every 4 (four) hours as needed for wheezing or shortness of breath. 10/21/22 06/19/23  Gowens, Mariah L, PA-C  budesonide -formoterol  (SYMBICORT ) 160-4.5 MCG/ACT inhaler Inhale 2 puffs into the lungs 2 (two) times daily. 08/13/19 06/19/23  Wellington Half, FNP  clonazePAM (KLONOPIN) 1 MG tablet Take 1 mg by mouth 3 (three) times daily. 05/05/23   [provider]  [facility] MAGNESIUM  OXIDE 250 MG TABS Take 1 tablet by mouth daily. 01/29/23   [provider]  cyclobenzaprine (FLEXERIL) 10 MG tablet Take 10 mg by mouth 2 (two) times daily. 04/25/23   [provider]  doxycycline (VIBRA-TABS) 100 MG tablet Take 100 mg by mouth 2 (two) times daily. 04/25/23   [provider]  escitalopram (LEXAPRO) 10 MG tablet Take 10 mg by mouth daily.    [provider]  gabapentin (NEURONTIN) 400 MG  capsule Take 400 mg by mouth 3 (three) times daily. 04/25/23   [provider]  hydrochlorothiazide (HYDRODIURIL) 25 MG tablet Take 25 mg by mouth daily. 04/25/23   [provider]  hydrOXYzine (VISTARIL) 50 MG capsule Take 50 mg by mouth 3 (three) times daily. 04/25/23   [provider]  ipratropium-albuterol  (DUONEB) 0.5-2.5 (3) MG/3ML SOLN Take 3 mLs by nebulization every 4 (four) hours as needed. 08/13/19   Wellington Half, FNP  levocetirizine (XYZAL ) 5 MG tablet Take 1 tablet (5 mg total) by mouth every evening. 08/13/19   Wellington Half, FNP  medroxyPROGESTERone (DEPO-PROVERA) 150 MG/ML injection Inject 150 mg into the muscle every 3 (three) months.    [provider]  montelukast  (SINGULAIR ) 10 MG tablet Take 1 tablet (10 mg total) by mouth at bedtime. 08/13/19   Wellington Half, FNP  ondansetron  (ZOFRAN ) 4 MG tablet Take 1 tablet (4 mg total) by mouth every 8 (eight) hours as needed for nausea or vomiting. 09/20/18   Cook, Jayce G, DO  pantoprazole  (PROTONIX ) 40 MG tablet Take 1 tablet (40 mg total) by mouth daily. 09/20/18   Cook, Jayce G, DO  predniSONE  (DELTASONE ) 10 MG tablet Take 10 mg by mouth 3 (three) times daily. 06/12/23   [provider]  predniSONE  (DELTASONE ) 20 MG tablet Take 20 mg by mouth 3 (three) times daily. 06/12/23   [provider]     Critical care time:  45 minutes      CRITICAL CARE Performed by: Georgette Helmer   Total critical care time: 45 minutes  Critical care time  was exclusive of separately billable procedures and treating other patients.  Critical care was necessary to treat or prevent imminent or life-threatening deterioration.  Critical care was time spent personally by me on the following activities: development of treatment plan with patient and/or surrogate as well as nursing, discussions with consultants, evaluation of patient's response to treatment, examination of patient, obtaining  history from patient or surrogate, ordering and performing treatments and interventions, ordering and review of laboratory studies, ordering and review of radiographic studies, pulse oximetry and re-evaluation of patient's condition.   Linsey Hirota, MD  [facility] Internal Medicine Resident  See Amion for pager If no response to pager , please call [phone] until 7pm After 7:00 pm call Elink  [phone]

## 2023-06-20 NOTE — TOC CM/SW Note (Signed)
 Transition of Care [facility]) - Inpatient Brief Assessment   Patient Details  Name: Roberta Soto MRN: [id number] Date of Birth: 1980-11-02  Transition of Care [facility]) CM/SW Contact:    Tom-Johnson, Angelique Ken, RN Phone Number: 06/20/2023, 1:49 PM   Clinical Narrative:  Patient presented to the ED with Altered Mental Status and severe Rt Knee pain. Patient was found to be Septic, on IV abx.  Ortho consulted and scheduled for I&D with Dr. Christiane Cowing today 06/20/23.  CM unable to assess patient at this time as patient is currently in OR.   Patient not Medically ready for discharge.  CM will continue to follow as patient progresses with care towards discharge.        Transition of Care Asessment:

## 2023-06-21 ENCOUNTER — Encounter (HOSPITAL_COMMUNITY): Payer: Self-pay | Admitting: Orthopaedic Surgery

## 2023-06-21 DIAGNOSIS — B951 Streptococcus, group B, as the cause of diseases classified elsewhere: Secondary | ICD-10-CM

## 2023-06-21 DIAGNOSIS — A409 Streptococcal sepsis, unspecified: Secondary | ICD-10-CM

## 2023-06-21 DIAGNOSIS — A419 Sepsis, unspecified organism: Secondary | ICD-10-CM | POA: Diagnosis not present

## 2023-06-21 DIAGNOSIS — M25561 Pain in right knee: Secondary | ICD-10-CM | POA: Diagnosis not present

## 2023-06-21 DIAGNOSIS — I501 Left ventricular failure: Secondary | ICD-10-CM | POA: Diagnosis not present

## 2023-06-21 DIAGNOSIS — R6520 Severe sepsis without septic shock: Secondary | ICD-10-CM | POA: Diagnosis not present

## 2023-06-21 DIAGNOSIS — R404 Transient alteration of awareness: Secondary | ICD-10-CM | POA: Diagnosis not present

## 2023-06-21 LAB — GLUCOSE, CAPILLARY
Glucose-Capillary: 145 mg/dL — ABNORMAL HIGH (ref 70–99)
Glucose-Capillary: 142 mg/dL — ABNORMAL HIGH (ref 70–99)
Glucose-Capillary: 145 mg/dL — ABNORMAL HIGH (ref 70–99)
Glucose-Capillary: 166 mg/dL — ABNORMAL HIGH (ref 70–99)

## 2023-06-21 LAB — BODY FLUID CULTURE W GRAM STAIN

## 2023-06-21 MED ORDER — GABAPENTIN 400 MG PO CAPS
400.0000 mg | ORAL_CAPSULE | Freq: Three times a day (TID) | ORAL | Status: DC
Start: 2023-06-21 — End: 2023-06-23
  Administered 2023-06-21 – 2023-06-23 (×7): 400 mg via ORAL
  Filled 2023-06-21 (×8): qty 1

## 2023-06-21 MED ORDER — SODIUM CHLORIDE 0.9 % IV SOLN
2.0000 g | INTRAVENOUS | Status: DC
  Administered 2023-06-21 – 2023-06-26 (×6): 2 g via INTRAVENOUS
  Filled 2023-06-21 (×6): qty 20

## 2023-06-21 MED ORDER — ENOXAPARIN SODIUM 60 MG/0.6ML IJ SOSY
50.0000 mg | PREFILLED_SYRINGE | INTRAMUSCULAR | Status: DC
  Administered 2023-06-21 – 2023-06-22 (×2): 50 mg via SUBCUTANEOUS
  Filled 2023-06-21 (×2): qty 0.6

## 2023-06-21 MED ORDER — FLUTICASONE FUROATE-VILANTEROL 100-25 MCG/ACT IN AEPB
1.0000 | INHALATION_SPRAY | Freq: Every day | RESPIRATORY_TRACT | Status: DC
  Administered 2023-06-22 – 2023-06-29 (×7): 1 via RESPIRATORY_TRACT
  Filled 2023-06-21 (×2): qty 28

## 2023-06-21 NOTE — Consult Note (Signed)
 Cardiology Consultation   Patient ID: Roberta Soto MRN: [id number]; DOB: 12/19/1980  Admit date: 06/19/2023 Date of Consult: 06/21/2023  PCP:  Patient, No Pcp Per   [facility] Providers Cardiologist:  None        Patient Profile:   Roberta Soto is a 43 y.o. female with a hx of HTN who is being seen 06/21/2023 for the evaluation of new onset LV dysfunction at the request of Dr. Marce Sensing..  History of Present Illness:   Ms. Geerdes presented on 5/8 with fever and knee pain and was found to have cellulitis and bursitis and underwent I&D of the bursa and aspiration of her knee joint which showed frank purulence. Blood cultures demonstrated S. Pyogenes and she has been treated with IV anti-biotics. A 2D echo demonstrated new LV dysfunction with an EF of 30%. The patient has improved. Her echo did not show any vegetations.    Past Medical History:  Diagnosis Date   Asthma    Hypertension     Past Surgical History:  Procedure Laterality Date   c-section     X4   INCISION AND DRAINAGE OF WOUND Right 06/20/2023   Procedure: IRRIGATION AND DEBRIDEMENT RIGHT KNEE;  Surgeon: Wes Hamman, MD;  Location: [facility] OR;  Service: Orthopedics;  Laterality: Right;     Home Medications:  Prior to Admission medications   Medication Sig Start Date End Date Taking? Authorizing Provider  albuterol  (PROVENTIL ) (2.5 MG/3ML) 0.083% nebulizer solution Take 3 mLs (2.5 mg total) by nebulization every 6 (six) hours as needed for wheezing or shortness of breath. 08/14/19   Ruth Cove, MD  albuterol  (VENTOLIN  HFA) 108 (90 Base) MCG/ACT inhaler Inhale 2 puffs into the lungs every 4 (four) hours as needed for wheezing or shortness of breath. 10/21/22 06/19/23  Gowens, Mariah L, PA-C  budesonide -formoterol  (SYMBICORT ) 160-4.5 MCG/ACT inhaler Inhale 2 puffs into the lungs 2 (two) times daily. 08/13/19 06/19/23  Wellington Half, FNP  clonazePAM (KLONOPIN) 1 MG tablet Take 1 mg by mouth 3 (three) times  daily. 05/05/23   [provider]  [facility] MAGNESIUM  OXIDE 250 MG TABS Take 1 tablet by mouth daily. 01/29/23   [provider]  cyclobenzaprine (FLEXERIL) 10 MG tablet Take 10 mg by mouth 2 (two) times daily. 04/25/23   [provider]  doxycycline (VIBRA-TABS) 100 MG tablet Take 100 mg by mouth 2 (two) times daily. 04/25/23   [provider]  escitalopram (LEXAPRO) 10 MG tablet Take 10 mg by mouth daily.    [provider]  gabapentin (NEURONTIN) 400 MG capsule Take 400 mg by mouth 3 (three) times daily. 04/25/23   [provider]  hydrochlorothiazide (HYDRODIURIL) 25 MG tablet Take 25 mg by mouth daily. 04/25/23   [provider]  hydrOXYzine (VISTARIL) 50 MG capsule Take 50 mg by mouth 3 (three) times daily. 04/25/23   [provider]  ipratropium-albuterol  (DUONEB) 0.5-2.5 (3) MG/3ML SOLN Take 3 mLs by nebulization every 4 (four) hours as needed. 08/13/19   Wellington Half, FNP  levocetirizine (XYZAL ) 5 MG tablet Take 1 tablet (5 mg total) by mouth every evening. 08/13/19   Wellington Half, FNP  medroxyPROGESTERone (DEPO-PROVERA) 150 MG/ML injection Inject 150 mg into the muscle every 3 (three) months.    [provider]  ondansetron  (ZOFRAN ) 4 MG tablet Take 1 tablet (4 mg total) by mouth every 8 (eight) hours as needed for nausea or vomiting. 09/20/18   Cook, Jayce G, DO  predniSONE  (DELTASONE ) 10 MG tablet Take 10 mg by mouth 3 (three) times daily. 06/12/23   [provider]  predniSONE  (DELTASONE ) 20 MG tablet Take 20 mg by mouth 3 (three) times daily. 06/12/23   [provider]    Inpatient Medications: Scheduled Meds:  acetaminophen   650 mg Oral Q6H   Chlorhexidine Gluconate Cloth  6 each Topical Daily   clonazePAM  1 mg Oral TID   enoxaparin  (LOVENOX ) injection  50 mg Subcutaneous Q24H   fluticasone furoate-vilanterol  1 puff Inhalation Daily   gabapentin  400 mg Oral TID   insulin  aspart   0-5 Units Subcutaneous QHS   insulin  aspart  0-9 Units Subcutaneous TID WC   Continuous Infusions:  cefTRIAXone  (ROCEPHIN )  IV Stopped (06/21/23 0711)   PRN Meds: cyclobenzaprine, docusate sodium , HYDROmorphone  (DILAUDID ) injection, ipratropium-albuterol , oxyCODONE, polyethylene glycol  Allergies:    Allergies  Allergen Reactions   Fruit & Vegetable Daily [Nutritional Supplements] Other (See Comments)    Unknown reaction   Latex Rash    Social History:   Social History   Socioeconomic History   Marital status: Married    Spouse name: Not on file   Number of children: Not on file   Years of education: Not on file   Highest education level: Not on file  Occupational History   Not on file  Tobacco Use   Smoking status: Former    Types: Cigarettes    Start date: 2024   Smokeless tobacco: Never  Vaping Use   Vaping status: Former  Substance and Sexual Activity   Alcohol use: No   Drug use: Never   Sexual activity: Not on file  Other Topics Concern   Not on file  Social History Narrative   Not on file   Social Drivers of Health   Financial Resource Strain: Not on file  Food Insecurity: Not on file  Transportation Needs: Not on file  Physical Activity: Not on file  Stress: Not on file  Social Connections: Not on file  Intimate Partner Violence: Not on file    Family History:    Family History  Problem Relation Age of Onset   Cancer Mother    Hypertension Mother    Cancer Brother    Healthy Father      ROS:  Please see the history of present illness.   All other ROS reviewed and negative.     Physical Exam/Data:   Vitals:   06/21/23 0815 06/21/23 0900 06/21/23 1000 06/21/23 1124  BP:  106/80 97/75   Pulse:  (!) 122 (!) 117   Resp:  12 12   Temp: 99.2 F (37.3 C)   98.8 F (37.1 C)  TempSrc: Oral   Axillary  SpO2:  94% 97%   Weight:      Height:        Intake/Output Summary (Last 24 hours) at 06/21/2023 1215 Last data filed at 06/21/2023  0800 Gross per 24 hour  Intake 500 ml  Output 20 ml  Net 480 ml      06/21/2023    5:00 AM 06/20/2023    5:00 AM 06/19/2023   12:06 PM  Last 3 Weights  Weight (lbs) 282 lb 3 oz 297 lb 2.9 oz 253 lb 8.5 oz  Weight (kg) 128 kg 134.8 kg 115 kg     Body mass index is 44.2 kg/m.  General:  Well nourished, sleeping, well developed, in no acute distress HEENT: normal Neck: 6 cm JVD Vascular:  No carotid bruits; Distal pulses 2+ bilaterally Cardiac:  normal S1, S2; RRR; no murmur  Lungs:  clear to auscultation bilaterally, no wheezing, rhonchi or rales  Abd: soft, nontender, no hepatomegaly  Ext: no edema Musculoskeletal:  No deformities, BUE and BLE strength normal and equal Skin: warm and dry  Neuro:  CNs 2-12 intact, no focal abnormalities noted Psych:  Normal affect   EKG:  The EKG was personally reviewed and demonstrates:  sinus tachycardia Telemetry:  Telemetry was personally reviewed and demonstrates:  sinus tachycardia  Relevant CV Studies: 2D echo reviewed  Laboratory Data:  High Sensitivity Troponin:  No results for input(s): "TROPONINIHS" in the last 720 hours.   Chemistry Recent Labs  Lab 06/19/23 1348 06/19/23 1349 06/19/23 1402 06/19/23 2055 06/20/23 0230  NA  --  142 141  --  141  K  --  3.3* 3.2*  --  3.6  CL  --  107  --   --  108  CO2  --  24  --   --  20*  GLUCOSE  --  131*  --   --  94  BUN  --  14  --   --  14  CREATININE  --  1.51*  --  1.58* 1.53*  CALCIUM   --  8.1*  --   --  7.7*  MG 1.8  --   --   --  1.5*  GFRNONAA  --  44*  --  42* 44*  ANIONGAP  --  11  --   --  13    Recent Labs  Lab 06/19/23 1349 06/20/23 0230  PROT 6.1* 5.4*  ALBUMIN 3.4* 2.8*  AST 47* 36  ALT 48* 40  ALKPHOS 70 63  BILITOT 1.1 1.2   Lipids No results for input(s): "CHOL", "TRIG", "HDL", "LABVLDL", "LDLCALC", "CHOLHDL" in the last 168 hours.  Hematology Recent Labs  Lab 06/19/23 1349 06/19/23 1402 06/19/23 2055 06/20/23 0230  WBC 25.8*  --  30.7* 41.7*   RBC 4.82  --  4.78 4.59  HGB 9.4* 10.9* 9.3* 8.8*  HCT 33.8* 32.0* 33.4* 31.9*  MCV 70.1*  --  69.9* 69.5*  MCH 19.5*  --  19.5* 19.2*  MCHC 27.8*  --  27.8* 27.6*  RDW 18.3*  --  18.3* 18.4*  PLT 462*  --  376 364   Thyroid No results for input(s): "TSH", "FREET4" in the last 168 hours.  BNPNo results for input(s): "BNP", "PROBNP" in the last 168 hours.  DDimer No results for input(s): "DDIMER" in the last 168 hours.   Radiology/Studies:  ECHOCARDIOGRAM COMPLETE Result Date: 06/20/2023    ECHOCARDIOGRAM REPORT   Patient Name:   MURNA PEACE Date of Exam: 06/20/2023 Medical Rec #:  [id number]     Height:       67.0 in Accession #:    [id number]    Weight:       297.2 lb Date of Birth:  Aug 25, 1980     BSA:          2.393 m Patient Age:    42 years      BP:           107/85 mmHg Patient Gender: F             HR:           98 bpm. Exam Location:  Inpatient Procedure: 2D Echo (Both Spectral and Color Flow Doppler were utilized during  procedure). Indications:    sepsis  History:        Patient has no prior history of Echocardiogram examinations.                 Signs/Symptoms:Altered Mental Status.  Sonographer:    Dione Franks RDCS Referring Phys: [id number] LAURA R GLEASON IMPRESSIONS  1. Left ventricular ejection fraction, by estimation, is 30 to 35%. The left ventricle has moderately decreased function. The left ventricle demonstrates global hypokinesis. The left ventricular internal cavity size was moderately dilated. Left ventricular diastolic parameters were normal.  2. Right ventricular systolic function is normal. The right ventricular size is normal. There is normal pulmonary artery systolic pressure.  3. Left atrial size was moderately dilated.  4. The mitral valve is abnormal. Mild mitral valve regurgitation. No evidence of mitral stenosis.  5. The aortic valve is tricuspid. Aortic valve regurgitation is not visualized. No aortic stenosis is present.  6. The inferior vena cava is  normal in size with greater than 50% respiratory variability, suggesting right atrial pressure of 3 mmHg. FINDINGS  Left Ventricle: Left ventricular ejection fraction, by estimation, is 30 to 35%. The left ventricle has moderately decreased function. The left ventricle demonstrates global hypokinesis. Strain was performed and the global longitudinal strain is indeterminate. The left ventricular internal cavity size was moderately dilated. There is no left ventricular hypertrophy. Left ventricular diastolic parameters were normal. Right Ventricle: The right ventricular size is normal. No increase in right ventricular wall thickness. Right ventricular systolic function is normal. There is normal pulmonary artery systolic pressure. The tricuspid regurgitant velocity is 2.65 m/s, and  with an assumed right atrial pressure of 3 mmHg, the estimated right ventricular systolic pressure is 31.1 mmHg. Left Atrium: Left atrial size was moderately dilated. Right Atrium: Right atrial size was normal in size. Pericardium: There is no evidence of pericardial effusion. Mitral Valve: The mitral valve is abnormal. There is mild thickening of the mitral valve leaflet(s). Mild mitral valve regurgitation. No evidence of mitral valve stenosis. Tricuspid Valve: The tricuspid valve is normal in structure. Tricuspid valve regurgitation is mild . No evidence of tricuspid stenosis. Aortic Valve: The aortic valve is tricuspid. Aortic valve regurgitation is not visualized. No aortic stenosis is present. Pulmonic Valve: The pulmonic valve was normal in structure. Pulmonic valve regurgitation is not visualized. No evidence of pulmonic stenosis. Aorta: The aortic root is normal in size and structure. Venous: The inferior vena cava is normal in size with greater than 50% respiratory variability, suggesting right atrial pressure of 3 mmHg. IAS/Shunts: No atrial level shunt detected by color flow Doppler. Additional Comments: 3D was performed not  requiring image post processing on an independent workstation and was indeterminate.  LEFT VENTRICLE PLAX 2D LVIDd:         6.50 cm      Diastology LVIDs:         5.50 cm      LV e' medial:  6.74 cm/s LV PW:         1.00 cm      LV e' lateral: 10.40 cm/s LV IVS:        1.10 cm LVOT diam:     2.20 cm LV SV:         76 LV SV Index:   32 LVOT Area:     3.80 cm  LV Volumes (MOD) LV vol d, MOD A2C: 150.0 ml LV vol d, MOD A4C: 134.0 ml LV vol s, MOD A2C: 99.0  ml LV vol s, MOD A4C: 103.0 ml LV SV MOD A2C:     51.0 ml LV SV MOD A4C:     134.0 ml LV SV MOD BP:      43.9 ml RIGHT VENTRICLE             IVC RV Basal diam:  3.30 cm     IVC diam: 1.90 cm RV S prime:     15.80 cm/s TAPSE (M-mode): 2.7 cm LEFT ATRIUM              Index        RIGHT ATRIUM           Index LA diam:        4.40 cm  1.84 cm/m   RA Area:     15.10 cm LA Vol (A2C):   102.0 ml 42.63 ml/m  RA Volume:   35.00 ml  14.63 ml/m LA Vol (A4C):   60.2 ml  25.16 ml/m LA Biplane Vol: 81.5 ml  34.06 ml/m  AORTIC VALVE LVOT Vmax:   118.00 cm/s LVOT Vmean:  80.500 cm/s LVOT VTI:    0.200 m  AORTA Ao Root diam: 3.20 cm Ao Asc diam:  2.90 cm MR Peak grad:    106.1 mmHg   TRICUSPID VALVE MR Mean grad:    73.0 mmHg    TR Peak grad:   28.1 mmHg MR Vmax:         515.00 cm/s  TR Vmax:        265.00 cm/s MR Vmean:        408.0 cm/s MR PISA:         0.57 cm     SHUNTS MR PISA Eff ROA: 4 mm        Systemic VTI:  0.20 m MR PISA Radius:  0.30 cm      Systemic Diam: 2.20 cm Janelle Mediate MD Electronically signed by Janelle Mediate MD Signature Date/Time: 06/20/2023/5:51:58 PM    Final    DG Chest Port 1 View Result Date: 06/19/2023 CLINICAL DATA:  Questionable sepsis. Evaluate for abnormality. Altered mental status. EXAM: PORTABLE CHEST 1 VIEW COMPARISON:  AP chest 10/21/2022, chest two views 03/09/2020 FINDINGS: There appears to be interval increase in the size the cardiac silhouette, now mildly enlarged. Mediastinal contours are within limits. New mild interstitial  thickening. No focal airspace opacity. No pleural effusion pneumothorax. No acute skeletal abnormality. IMPRESSION: Compared to 10/21/2022: 1. Interval increase in the size of the cardiac silhouette, now mildly enlarged. This may be secondary to cardiomegaly and/or a pericardial effusion. 2. New mild interstitial thickening, which may represent mild interstitial pulmonary edema. Electronically Signed   By: Bertina Broccoli M.D.   On: 06/19/2023 13:34   DG Knee Right Port Result Date: 06/19/2023 CLINICAL DATA:  Altered mental status. Shortness of breath. Redness and swelling. Sore and anterior right knee. Question of sepsis. EXAM: PORTABLE RIGHT KNEE - 1-2 VIEW COMPARISON:  None Available. Moderate quadriceps and patellar tendon FINDINGS: Chronic please like changes at the insertions on the patella. No joint effusion. Minimal medial compartment joint space narrowing. Mild chronic enthesopathic change at the patellar tendon insertion on the tibial tubercle. No acute fracture or dislocation. Diffuse subcutaneous fat edema and swelling, possibly systemic. IMPRESSION: 1. Diffuse subcutaneous fat edema and swelling, possibly systemic. 2. No acute fracture or dislocation. Electronically Signed   By: Bertina Broccoli M.D.   On: 06/19/2023 12:58     Assessment and Plan:  LV dysfunction - I strongly expect that she has LV dysfunction due to sepsis. I would suggest we repeat the 2D echo in 4-6 weeks as an outpatient. With borderline bp, no indication for GDMT at this point.  Strep sepsis - at this point, I'll defer need for TEE to ID service. No evidence of SBE on TTE. Strep can be sticky.    Risk Assessment/Risk Scores:        For questions or updates, please contact [facility] Please consult [email or web address] for contact info under    Signed, Manya Sells, MD  06/21/2023 12:15 PM

## 2023-06-21 NOTE — Consult Note (Addendum)
 [facility]    Date of Admission:  06/19/2023   Total days of inpatient antibiotics 1        Reason for Consult: bactermia    Principal Problem:   Abscess of bursa of right knee Active Problems:   Sepsis  AMS  Hemodynamic instability   Chronic health problem   AMS (altered mental status)   Assessment: 43 year old female with history of asthma presented with altered mental status.  Noted to have right knee cellulitis found to have #Strep species bacteremia 2/2 right knee septic bursitis status post I&D - Patient was febrile on admission with leukocytosis.  Right knee was aspirated in the ED which is growing strep pyogenous - Blood cultures have grown strep species - History of record with record review as patient was sleepy during my visit.  She stated that she has some knee pain after scraping her knee on a piece of furniture day before yesterday.  She was taken to the OR with orthopedics for debridement.  Noted purulence in prepatellar bursa.  Joint fluid was aspirated as well and sent for cultures.  Per OR note, also does not feel the knee was septic. - TTE without vegetation Recommendations:  -dc penicillin, start ceftriaxone . BCID was not positive for strep pyogenes as such will do ceftriaxone  for now.  -Repeat blood cx to ensure clearance -Pending strep sp speciation will decide on TEE(I wonder if it still could be strep pyogenes bacteremia just not Id'd on BCID.  -follow 5/9 right knee aspirate(obtianed at 1334) to see if joint is involved(septic arthritis) - Dr. Gillian Lacrosse is covering this weekend.   Evaluation of this patient requires complex antimicrobial therapy evaluation and counseling + isolation needs for disease transmission risk assessment and mitigation    Microbiology:   Antibiotics: Pip txo 5/8 Vanc 5/8-   Cultures: Blood 5/8 1/2 set strep sp  Urine  Other 5/8 fluid strep pyognes  5/9 gpc gram stain  HPI:  Roberta Soto is a 43 y.o. female with asthma presented with altered mental status.  Patient was febrile on admission with WBC 2 5K.  X-ray of right knee showed diffuse subcutaneous fat edema and swelling possibly systemic.  Noted right knee cellulitis.  Knee was aspirated in the ED which grew strep pyogenous. blood cultures grew strep species on BC ID.  ID engaged.  Review of Systems: Review of Systems  All other systems reviewed and are negative.   Past Medical History:  Diagnosis Date   Asthma    Hypertension     Social History   Tobacco Use   Smoking status: Former    Types: Cigarettes    Start date: 2024   Smokeless tobacco: Never  Vaping Use   Vaping status: Former  Substance Use Topics   Alcohol use: No   Drug use: Never    Family History  Problem Relation Age of Onset   Cancer Mother    Hypertension Mother    Cancer Brother    Healthy Father    Scheduled Meds:  acetaminophen   650 mg Oral Q6H   budesonide  (PULMICORT ) nebulizer solution  0.25 mg Nebulization BID   Chlorhexidine Gluconate Cloth  6 each Topical Daily   clonazePAM  1 mg Oral TID   heparin   5,000 Units Subcutaneous Q8H   insulin  aspart  0-5 Units Subcutaneous QHS   insulin  aspart  0-9 Units Subcutaneous TID WC   Continuous Infusions:  penicillin G potassium  12 Million Units in dextrose 5 % 500 mL CONTINUOUS infusion 12 Million Units (06/20/23 1931)   PRN Meds:.cyclobenzaprine, docusate sodium , HYDROmorphone  (DILAUDID ) injection, ipratropium-albuterol , oxyCODONE, polyethylene glycol Allergies  Allergen Reactions   Fruit & Vegetable Daily [Nutritional Supplements] Other (See Comments)    Unknown reaction   Latex Rash    OBJECTIVE: Blood pressure 111/80, pulse (!) 58, temperature 97.9 F (36.6 C), temperature source Oral, resp. rate 14, height 5\' 7"  (1.702 m), weight 134.8 kg, SpO2 98%.  Physical Exam Constitutional:      Comments: sleepy  HENT:     Head: Normocephalic and atraumatic.      Right Ear: Tympanic membrane normal.     Left Ear: Tympanic membrane normal.     Nose: Nose normal.     Mouth/Throat:     Mouth: Mucous membranes are moist.  Eyes:     Extraocular Movements: Extraocular movements intact.     Conjunctiva/sclera: Conjunctivae normal.     Pupils: Pupils are equal, round, and reactive to light.  Cardiovascular:     Rate and Rhythm: Normal rate and regular rhythm.     Heart sounds: No murmur heard.    No friction rub. No gallop.  Pulmonary:     Effort: Pulmonary effort is normal.     Breath sounds: Normal breath sounds.  Abdominal:     General: Abdomen is flat.     Palpations: Abdomen is soft.  Musculoskeletal:     Comments: Right knee bandaged with vac  Skin:    General: Skin is warm and dry.  Neurological:     General: No focal deficit present.     Mental Status: She is oriented to person, place, and time.  Psychiatric:        Mood and Affect: Mood normal.     Lab Results Lab Results  Component Value Date   WBC 41.7 (H) 06/20/2023   HGB 8.8 (L) 06/20/2023   HCT 31.9 (L) 06/20/2023   MCV 69.5 (L) 06/20/2023   PLT 364 06/20/2023    Lab Results  Component Value Date   CREATININE 1.53 (H) 06/20/2023   BUN 14 06/20/2023   NA 141 06/20/2023   K 3.6 06/20/2023   CL 108 06/20/2023   CO2 20 (L) 06/20/2023    Lab Results  Component Value Date   ALT 40 06/20/2023   AST 36 06/20/2023   ALKPHOS 63 06/20/2023   BILITOT 1.2 06/20/2023       Orlie Bjornstad, MD [facility] 06/21/2023, 1:17 AM

## 2023-06-21 NOTE — Progress Notes (Signed)
   NAME:  Chardae Buczek, MRN:  [id number], DOB:  06-03-1980, LOS: 2 ADMISSION DATE:  06/19/2023, CONSULTATION DATE:  06/21/23 REFERRING MD:  FMTS, CHIEF COMPLAINT:  altered mental status, sepsis    History of Present Illness:  Roberta Soto is a 43 y.o. F with PMH significant for asthma and HTN who presented to the ED with confusion and R knee pain.  She has been quite agitated, tachycardic and tachypneic and not providing any other history, though able to express that she is in severe pain and is following commands.   The R knee is red and swollen, as aspiration was performed in the ED and xray showed edema and swelling.   Labs were significant for WBC 25k, K 3.3, creatinine 1.5, lactic acid 2.5.  She received 2.5L IVF, vanc and zosyn .  She remained tachycardic and tachypneic and pain uncontrolled, so PCCM consulted.  Family at the bedside unaware of any trauma, drug use other than marijuana.   Pertinent  Medical History   has a past medical history of Asthma and Hypertension.   Significant Hospital Events: Including procedures, antibiotic start and stop dates in addition to other pertinent events   5/8 admit with likely septic arthritis of the R knee  5/9 off pressors. OR for I&D of infected pre-patellar bursa. Growing GAS from blood and joint.  5/9 echo shows EF 30%.  Interim History / Subjective:  Still having considerable pain.   Objective    Blood pressure 104/73, pulse (!) 122, temperature 99.2 F (37.3 C), temperature source Oral, resp. rate (!) 22, height 5\' 7"  (1.702 m), weight 128 kg, SpO2 97%.        Intake/Output Summary (Last 24 hours) at 06/21/2023 0926 Last data filed at 06/21/2023 0800 Gross per 24 hour  Intake 927.6 ml  Output 870 ml  Net 57.6 ml   Filed Weights   06/19/23 1206 06/20/23 0500 06/21/23 0500  Weight: 115 kg 134.8 kg 128 kg    General:  Well appearing female, in pain.  HEENT: MM pink/moist Neuro: Non focal exam. Clear sensorium.  CV: HS  normal PULM: clear  GI: soft,  Extremities: Right knee dressing. Lymphangitic streaking is less noticeable   Ancillary Tests Personally Reviewed:  Blood and bursa fluid growing GAS  Assessment & Plan:   Sepsis due to S.pyogenes bacteremia from right leg cellulitis and bursitis. No evidence of joint involvement or necrotizing fasciitis per orthopedics. No evidence of toxic shock New but asymptomatic LV dysfunction - may be sepsis related. Asthma - no exacerbation HTN by history.  Plan:  - Optimize multimodal pain control. Have added gabapentin as sciatica is part of problem - WBAT, PT  - ID following, continue ceftriaxone . - Cardiology consultation as will need follow up for LV dysfunction. - Holding home BP meds for now.   Best Practice (right click and "Reselect all SmartList Selections" daily)   Diet/type: regular diet DVT prophylaxis prophylactic heparin   Pressure ulcer(s): N/A GI prophylaxis: N/A Lines: N/A Foley:  N/A Code Status:  full code Last date of multidisciplinary goals of care discussion [updated 5/10]  Arlina Lair, MD [facility] ICU Physician [facility]  Pager: [phone] Or Epic Secure Chat After hours: [phone].  06/21/2023, 9:39 AM

## 2023-06-21 NOTE — Plan of Care (Signed)

## 2023-06-21 NOTE — Progress Notes (Signed)
   Subjective:  Patient reports pain as better.    Objective:   VITALS:   Vitals:   06/21/23 0700 06/21/23 0747 06/21/23 0800 06/21/23 0815  BP: 112/74  104/73   Pulse: (!) 124  (!) 122   Resp: (!) 25  (!) 22   Temp:    99.2 F (37.3 C)  TempSrc:    Oral  SpO2: 97% 95% 97%   Weight:      Height:        Cellulitis improving. Able to tolerate exam better.   Lab Results  Component Value Date   WBC 41.7 (H) 06/20/2023   HGB 8.8 (L) 06/20/2023   HCT 31.9 (L) 06/20/2023   MCV 69.5 (L) 06/20/2023   PLT 364 06/20/2023     Assessment/Plan:  1 Day Post-Op   - clinically improving - reaspiration of knee joint confirmed no septic arthritis - will plan to remove iodoform packing on Monday and start wet to dry dressing changes - cultures GPCs likely strep pyogenes - on ceftriaxone  - WBAT, ROM as tolerated, mobilize with PT  Claria Crofts 06/21/2023, 9:19 AM

## 2023-06-22 DIAGNOSIS — M71061 Abscess of bursa, right knee: Secondary | ICD-10-CM

## 2023-06-22 DIAGNOSIS — A409 Streptococcal sepsis, unspecified: Secondary | ICD-10-CM | POA: Diagnosis not present

## 2023-06-22 DIAGNOSIS — B955 Unspecified streptococcus as the cause of diseases classified elsewhere: Secondary | ICD-10-CM

## 2023-06-22 DIAGNOSIS — R7881 Bacteremia: Secondary | ICD-10-CM

## 2023-06-22 DIAGNOSIS — I501 Left ventricular failure: Secondary | ICD-10-CM | POA: Diagnosis not present

## 2023-06-22 LAB — CBC WITH DIFFERENTIAL/PLATELET
Abs Immature Granulocytes: 0 10*3/uL (ref 0.00–0.07)
Basophils Absolute: 0 10*3/uL (ref 0.0–0.1)
Basophils Relative: 0 %
Eosinophils Absolute: 1.3 10*3/uL — ABNORMAL HIGH (ref 0.0–0.5)
Eosinophils Relative: 4 %
HCT: 28 % — ABNORMAL LOW (ref 36.0–46.0)
Hemoglobin: 7.9 g/dL — ABNORMAL LOW (ref 12.0–15.0)
Lymphocytes Relative: 4 %
Lymphs Abs: 1.3 10*3/uL (ref 0.7–4.0)
MCH: 19.4 pg — ABNORMAL LOW (ref 26.0–34.0)
MCHC: 28.2 g/dL — ABNORMAL LOW (ref 30.0–36.0)
MCV: 68.6 fL — ABNORMAL LOW (ref 80.0–100.0)
Monocytes Absolute: 1.6 10*3/uL — ABNORMAL HIGH (ref 0.1–1.0)
Monocytes Relative: 5 %
Neutro Abs: 27.6 10*3/uL — ABNORMAL HIGH (ref 1.7–7.7)
Neutrophils Relative %: 87 %
Platelets: 386 10*3/uL (ref 150–400)
RBC: 4.08 MIL/uL (ref 3.87–5.11)
RDW: 18.5 % — ABNORMAL HIGH (ref 11.5–15.5)
WBC: 31.7 10*3/uL — ABNORMAL HIGH (ref 4.0–10.5)
nRBC: 0.1 % (ref 0.0–0.2)
nRBC: 1 /100{WBCs} — ABNORMAL HIGH

## 2023-06-22 LAB — BASIC METABOLIC PANEL WITH GFR
Anion gap: 11 (ref 5–15)
BUN: 11 mg/dL (ref 6–20)
CO2: 23 mmol/L (ref 22–32)
Calcium: 8.1 mg/dL — ABNORMAL LOW (ref 8.9–10.3)
Chloride: 104 mmol/L (ref 98–111)
Creatinine, Ser: 1.16 mg/dL — ABNORMAL HIGH (ref 0.44–1.00)
GFR, Estimated: >60 mL/min (ref >60)
Glucose, Bld: 124 mg/dL — ABNORMAL HIGH (ref 70–99)
Potassium: 3.2 mmol/L — ABNORMAL LOW (ref 3.5–5.1)
Sodium: 138 mmol/L (ref 135–145)

## 2023-06-22 LAB — CULTURE, BLOOD (ROUTINE X 2)

## 2023-06-22 LAB — GLUCOSE, CAPILLARY
Glucose-Capillary: 107 mg/dL — ABNORMAL HIGH (ref 70–99)
Glucose-Capillary: 153 mg/dL — ABNORMAL HIGH (ref 70–99)
Glucose-Capillary: 105 mg/dL — ABNORMAL HIGH (ref 70–99)
Glucose-Capillary: 135 mg/dL — ABNORMAL HIGH (ref 70–99)

## 2023-06-22 MED ORDER — HYDROMORPHONE HCL 1 MG/ML IJ SOLN
1.0000 mg | INTRAMUSCULAR | Status: DC | PRN
  Administered 2023-06-22 – 2023-06-27 (×8): 1 mg via INTRAVENOUS
  Filled 2023-06-22 (×9): qty 1

## 2023-06-22 MED ORDER — FERROUS SULFATE 325 (65 FE) MG PO TBEC: 325.0000 mg | DELAYED_RELEASE_TABLET | Freq: Two times a day (BID) | ORAL | 3 refills | Status: DC

## 2023-06-22 MED ORDER — ESCITALOPRAM OXALATE 10 MG PO TABS
10.0000 mg | ORAL_TABLET | Freq: Every day | ORAL | Status: DC
  Administered 2023-06-22 – 2023-06-29 (×8): 10 mg via ORAL
  Filled 2023-06-22 (×8): qty 1

## 2023-06-22 MED ORDER — POTASSIUM CHLORIDE CRYS ER 20 MEQ PO TBCR
40.0000 meq | EXTENDED_RELEASE_TABLET | Freq: Three times a day (TID) | ORAL | Status: AC
  Administered 2023-06-22 (×2): 40 meq via ORAL
  Filled 2023-06-22 (×2): qty 2

## 2023-06-22 MED ORDER — VITAMIN B-12 1000 MCG PO TABS
1000.0000 ug | ORAL_TABLET | Freq: Every day | ORAL | Status: DC
  Administered 2023-06-22 – 2023-06-29 (×8): 1000 ug via ORAL
  Filled 2023-06-22 (×8): qty 1

## 2023-06-22 NOTE — Plan of Care (Signed)
  Problem: Activity: Goal: Risk for activity intolerance will decrease Outcome: Progressing   Problem: Pain Managment: Goal: General experience of comfort will improve and/or be controlled Outcome: Progressing

## 2023-06-22 NOTE — Progress Notes (Signed)
 Progress Note  Patient Name: Roberta Soto Date of Encounter: 06/22/2023  Primary Cardiologist: None   Subjective   C/o right leg pain. No sob.  Inpatient Medications    Scheduled Meds:  acetaminophen   650 mg Oral Q6H   Chlorhexidine Gluconate Cloth  6 each Topical Daily   clonazePAM  1 mg Oral TID   enoxaparin  (LOVENOX ) injection  50 mg Subcutaneous Q24H   fluticasone furoate-vilanterol  1 puff Inhalation Daily   gabapentin  400 mg Oral TID   insulin  aspart  0-5 Units Subcutaneous QHS   insulin  aspart  0-9 Units Subcutaneous TID WC   Continuous Infusions:  cefTRIAXone  (ROCEPHIN )  IV 2 g (06/22/23 0604)   PRN Meds: cyclobenzaprine, docusate sodium , HYDROmorphone  (DILAUDID ) injection, ipratropium-albuterol , oxyCODONE, polyethylene glycol   Vital Signs    Vitals:   06/21/23 2157 06/22/23 0600 06/22/23 0818 06/22/23 0906  BP: 110/73 132/81  110/89  Pulse: (!) 114 (!) 117  (!) 112  Resp: 19 20  20   Temp: 98.7 F (37.1 C) 98.9 F (37.2 C)  99.9 F (37.7 C)  TempSrc: Axillary Axillary  Oral  SpO2: 96% 98% 97% 98%  Weight:      Height:        Intake/Output Summary (Last 24 hours) at 06/22/2023 0921 Last data filed at 06/22/2023 1610 Gross per 24 hour  Intake 300 ml  Output 950 ml  Net -650 ml   Filed Weights   06/19/23 1206 06/20/23 0500 06/21/23 0500  Weight: 115 kg 134.8 kg 128 kg    Telemetry    none - Personally Reviewed  ECG    none - Personally Reviewed  Physical Exam   GEN: No acute distress.   Neck: No JVD Cardiac: RRR, no murmurs, rubs, or gallops.  Respiratory: Clear to auscultation bilaterally. GI: Soft, nontender, non-distended  MS: No edema; No deformity. Neuro:  Nonfocal  Psych: Normal affect   Labs    Chemistry Recent Labs  Lab 06/19/23 1349 06/19/23 1402 06/19/23 2055 06/20/23 0230  NA 142 141  --  141  K 3.3* 3.2*  --  3.6  CL 107  --   --  108  CO2 24  --   --  20*  GLUCOSE 131*  --   --  94  BUN 14  --   --  14   CREATININE 1.51*  --  1.58* 1.53*  CALCIUM  8.1*  --   --  7.7*  PROT 6.1*  --   --  5.4*  ALBUMIN 3.4*  --   --  2.8*  AST 47*  --   --  36  ALT 48*  --   --  40  ALKPHOS 70  --   --  63  BILITOT 1.1  --   --  1.2  GFRNONAA 44*  --  42* 44*  ANIONGAP 11  --   --  13     Hematology Recent Labs  Lab 06/19/23 1349 06/19/23 1402 06/19/23 2055 06/20/23 0230  WBC 25.8*  --  30.7* 41.7*  RBC 4.82  --  4.78 4.59  HGB 9.4* 10.9* 9.3* 8.8*  HCT 33.8* 32.0* 33.4* 31.9*  MCV 70.1*  --  69.9* 69.5*  MCH 19.5*  --  19.5* 19.2*  MCHC 27.8*  --  27.8* 27.6*  RDW 18.3*  --  18.3* 18.4*  PLT 462*  --  376 364    Cardiac EnzymesNo results for input(s): "TROPONINI" in the last 168 hours. No  results for input(s): "TROPIPOC" in the last 168 hours.   BNPNo results for input(s): "BNP", "PROBNP" in the last 168 hours.   DDimer No results for input(s): "DDIMER" in the last 168 hours.   Radiology    ECHOCARDIOGRAM COMPLETE Result Date: 06/20/2023    ECHOCARDIOGRAM REPORT   Patient Name:   [name] Date of Exam: 06/20/2023 Medical Rec #:  [id number]     Height:       67.0 in Accession #:    [id number]    Weight:       297.2 lb Date of Birth:  [birth year]     BSA:          2.393 m Patient Age:    42 years      BP:           107/85 mmHg Patient Gender: F             HR:           98 bpm. Exam Location:  Inpatient Procedure: 2D Echo (Both Spectral and Color Flow Doppler were utilized during            procedure). Indications:    sepsis  History:        Patient has no prior history of Echocardiogram examinations.                 Signs/Symptoms:Altered Mental Status.  Sonographer:    Dione Franks RDCS Referring Phys: [id number] LAURA R GLEASON IMPRESSIONS  1. Left ventricular ejection fraction, by estimation, is 30 to 35%. The left ventricle has moderately decreased function. The left ventricle demonstrates global hypokinesis. The left ventricular internal cavity size was moderately dilated. Left  ventricular diastolic parameters were normal.  2. Right ventricular systolic function is normal. The right ventricular size is normal. There is normal pulmonary artery systolic pressure.  3. Left atrial size was moderately dilated.  4. The mitral valve is abnormal. Mild mitral valve regurgitation. No evidence of mitral stenosis.  5. The aortic valve is tricuspid. Aortic valve regurgitation is not visualized. No aortic stenosis is present.  6. The inferior vena cava is normal in size with greater than 50% respiratory variability, suggesting right atrial pressure of 3 mmHg. FINDINGS  Left Ventricle: Left ventricular ejection fraction, by estimation, is 30 to 35%. The left ventricle has moderately decreased function. The left ventricle demonstrates global hypokinesis. Strain was performed and the global longitudinal strain is indeterminate. The left ventricular internal cavity size was moderately dilated. There is no left ventricular hypertrophy. Left ventricular diastolic parameters were normal. Right Ventricle: The right ventricular size is normal. No increase in right ventricular wall thickness. Right ventricular systolic function is normal. There is normal pulmonary artery systolic pressure. The tricuspid regurgitant velocity is 2.65 m/s, and  with an assumed right atrial pressure of 3 mmHg, the estimated right ventricular systolic pressure is 31.1 mmHg. Left Atrium: Left atrial size was moderately dilated. Right Atrium: Right atrial size was normal in size. Pericardium: There is no evidence of pericardial effusion. Mitral Valve: The mitral valve is abnormal. There is mild thickening of the mitral valve leaflet(s). Mild mitral valve regurgitation. No evidence of mitral valve stenosis. Tricuspid Valve: The tricuspid valve is normal in structure. Tricuspid valve regurgitation is mild . No evidence of tricuspid stenosis. Aortic Valve: The aortic valve is tricuspid. Aortic valve regurgitation is not visualized. No  aortic stenosis is present. Pulmonic Valve: The pulmonic valve was normal in structure. Pulmonic valve regurgitation is  not visualized. No evidence of pulmonic stenosis. Aorta: The aortic root is normal in size and structure. Venous: The inferior vena cava is normal in size with greater than 50% respiratory variability, suggesting right atrial pressure of 3 mmHg. IAS/Shunts: No atrial level shunt detected by color flow Doppler. Additional Comments: 3D was performed not requiring image post processing on an independent workstation and was indeterminate.  LEFT VENTRICLE PLAX 2D LVIDd:         6.50 cm      Diastology LVIDs:         5.50 cm      LV e' medial:  6.74 cm/s LV PW:         1.00 cm      LV e' lateral: 10.40 cm/s LV IVS:        1.10 cm LVOT diam:     2.20 cm LV SV:         76 LV SV Index:   32 LVOT Area:     3.80 cm  LV Volumes (MOD) LV vol d, MOD A2C: 150.0 ml LV vol d, MOD A4C: 134.0 ml LV vol s, MOD A2C: 99.0 ml LV vol s, MOD A4C: 103.0 ml LV SV MOD A2C:     51.0 ml LV SV MOD A4C:     134.0 ml LV SV MOD BP:      43.9 ml RIGHT VENTRICLE             IVC RV Basal diam:  3.30 cm     IVC diam: 1.90 cm RV S prime:     15.80 cm/s TAPSE (M-mode): 2.7 cm LEFT ATRIUM              Index        RIGHT ATRIUM           Index LA diam:        4.40 cm  1.84 cm/m   RA Area:     15.10 cm LA Vol (A2C):   102.0 ml 42.63 ml/m  RA Volume:   35.00 ml  14.63 ml/m LA Vol (A4C):   60.2 ml  25.16 ml/m LA Biplane Vol: 81.5 ml  34.06 ml/m  AORTIC VALVE LVOT Vmax:   118.00 cm/s LVOT Vmean:  80.500 cm/s LVOT VTI:    0.200 m  AORTA Ao Root diam: 3.20 cm Ao Asc diam:  2.90 cm MR Peak grad:    106.1 mmHg   TRICUSPID VALVE MR Mean grad:    73.0 mmHg    TR Peak grad:   28.1 mmHg MR Vmax:         515.00 cm/s  TR Vmax:        265.00 cm/s MR Vmean:        408.0 cm/s MR PISA:         0.57 cm     SHUNTS MR PISA Eff ROA: 4 mm        Systemic VTI:  0.20 m MR PISA Radius:  0.30 cm      Systemic Diam: 2.20 cm Janelle Mediate MD Electronically  signed by Janelle Mediate MD Signature Date/Time: 06/20/2023/5:51:58 PM    Final     Cardiac Studies   See above  Patient Profile     43 y.o. female admitted with sepsis due to S.Pyogenes due to cellulitis/right knee bursitis and found to have new LV dysfunction in the setting of sepsis  Assessment & Plan    LV dysfunction - I suspect  that this will resolve when infection resolves and would repeat her 2D echo as an outpatient if she does not get an inpatient TEE. S.Pyogenes - improving on IV anti-biotics. Duration of therapy as per ID service.      For questions or updates, please contact [facility] Please consult [email or web address] for contact info under Cardiology/STEMI.      Signed, Manya Sells, MD  06/22/2023, 9:21 AM

## 2023-06-22 NOTE — Evaluation (Signed)
 Physical Therapy Evaluation Patient Details Name: Roberta Soto MRN: [id number] DOB: 1981-02-09 Today's Date: 06/22/2023  History of Present Illness  Admitted with R knee septic bursitis; S/p I&D R knee, WBAT, no ROM restrictions; recovery complicated by new, but asymptomatic LV dysfunction;  has a past medical history of Asthma and Hypertension.  Clinical Impression  Pt admitted with above diagnosis. Lives at home alone, in a single-level home with one steps to enter; Prior to admission, pt was managing independently; Presents to PT with RLE pain limiting activity tolerance, decr funtional independence; Got up to EOB with light mod assist. Stood with light mod assist, and took pivot steps bed to recliner with min assist and RW; did not accept much weight in R stance due to pain; Will have lots of assist as needed per pt and sister;  Pt currently with functional limitations due to the deficits listed below (see PT Problem List). Pt will benefit from skilled PT to increase their independence and safety with mobility to allow discharge to the venue listed below.           If plan is discharge home, recommend the following: A little help with walking and/or transfers;A little help with bathing/dressing/bathroom;Assistance with cooking/housework   Can travel by private Tax inspector (2 wheels);BSC/3in1  Recommendations for Other Services       Functional Status Assessment Patient has had a recent decline in their functional status and demonstrates the ability to make significant improvements in function in a reasonable and predictable amount of time.     Precautions / Restrictions Precautions Precautions: Fall Recall of Precautions/Restrictions: Intact Restrictions Weight Bearing Restrictions Per Provider Order: No RLE Weight Bearing Per Provider Order: Weight bearing as tolerated      Mobility  Bed Mobility Overal bed mobility: Needs  Assistance Bed Mobility: Supine to Sit     Supine to sit: Mod assist     General bed mobility comments: Incr time and use of bedrails; light mod assist and use of bed pad to scoot closer to EOB and square off hips at EOB    Transfers Overall transfer level: Needs assistance Equipment used: Rolling walker (2 wheels) Transfers: Sit to/from Stand, Bed to chair/wheelchair/BSC Sit to Stand: Mod assist   Step pivot transfers: Min assist       General transfer comment: Cues for hand placement adn safety; pt's sister also came in to help; pt will tend to try a few different methods of hand placement before standing; took pivot steps with RW bed to recliner on her right; decr weight acceptanc on RLE    Ambulation/Gait                  Stairs            Wheelchair Mobility     Tilt Bed    Modified Rankin (Stroke Patients Only)       Balance Overall balance assessment: Needs assistance   Sitting balance-Leahy Scale: Fair       Standing balance-Leahy Scale: Poor                               Pertinent Vitals/Pain Pain Assessment Pain Assessment: Faces Faces Pain Scale: Hurts even more Pain Location: R LE Pain Descriptors / Indicators: Grimacing, Guarding Pain Intervention(s): Monitored during session    Home Living Family/patient expects to be discharged to:: Private residence  Living Arrangements: Other relatives Available Help at Discharge: Family;Available PRN/intermittently (While no one will be staying overnight, pt's sister states they will be present and helping during most waking hours) Type of Home:  [facility], first floor) Home Access: Stairs to enter Entrance Stairs-Rails: None Entrance Stairs-Number of Steps: 1 (curb-like step)   Home Layout: One level Home Equipment: None      Prior Function Prior Level of Function : Independent/Modified Independent                     Extremity/Trunk Assessment   Upper Extremity  Assessment Upper Extremity Assessment: Overall WFL for tasks assessed    Lower Extremity Assessment Lower Extremity Assessment: RLE deficits/detail RLE Deficits / Details: Grossly decr AROM and strength, limited by pain post injury and surgery       Communication   Communication Communication: No apparent difficulties    Cognition Arousal: Alert Behavior During Therapy: WFL for tasks assessed/performed                             Following commands: Intact (and tends to want to try a few different ways to do things)       Cueing Cueing Techniques: Verbal cues     General Comments General comments (skin integrity, edema, etc.): pt's sister Aurelia Leeks present and helpful    Exercises     Assessment/Plan    PT Assessment Patient needs continued PT services  PT Problem List Decreased strength;Decreased range of motion;Decreased activity tolerance;Decreased balance;Decreased mobility;Decreased coordination;Decreased knowledge of use of DME;Decreased safety awareness;Decreased knowledge of precautions;Pain       PT Treatment Interventions DME instruction;Gait training;Stair training;Functional mobility training;Therapeutic activities;Therapeutic exercise;Balance training;Patient/family education;Manual techniques    PT Goals (Current goals can be found in the Care Plan section)  Acute Rehab PT Goals Patient Stated Goal: Hopes to get home soon PT Goal Formulation: With patient Time For Goal Achievement: 07/06/23 Potential to Achieve Goals: Good    Frequency Min 4X/week     Co-evaluation               AM-PAC PT "6 Clicks" Mobility  Outcome Measure Help needed turning from your back to your side while in a flat bed without using bedrails?: A Little Help needed moving from lying on your back to sitting on the side of a flat bed without using bedrails?: A Little Help needed moving to and from a bed to a chair (including a wheelchair)?: A Little Help needed  standing up from a chair using your arms (e.g., wheelchair or bedside chair)?: A Little Help needed to walk in hospital room?: A Lot Help needed climbing 3-5 steps with a railing? : A Lot 6 Click Score: 16    End of Session Equipment Utilized During Treatment: Gait belt Activity Tolerance: Patient tolerated treatment well Patient left: in chair;with call bell/phone within reach;with family/visitor present Nurse Communication: Mobility status PT Visit Diagnosis: Unsteadiness on feet (R26.81);Other abnormalities of gait and mobility (R26.89);Pain Pain - Right/Left: Right Pain - part of body: Leg    Time: 0920-1000 PT Time Calculation (min) (ACUTE ONLY): 40 min   Charges:   PT Evaluation $PT Eval Moderate Complexity: 1 Mod PT Treatments $Gait Training: 8-22 mins $Therapeutic Activity: 8-22 mins PT General Charges $$ ACUTE PT VISIT: 1 Visit         Darcus Eastern, PT  Acute Rehabilitation Services Office [phone] Secure Chat welcomed   Lanetta Pion  Blanche Bunker 06/22/2023, 5:01 PM

## 2023-06-22 NOTE — Progress Notes (Signed)
 [facility] PROGRESS NOTE   Roberta Soto ZOX:[id number] DOB: [birth year] DOA: 06/19/2023  PCP: Patient, No Pcp Per  Brief History: 43 y.o. F with PMH significant for asthma and HTN who presented to the ED with confusion and R knee pain.  Patient was noted to be quite agitated and tachycardic.  She was initially admitted to the ICU.  There was concern for right knee infection.  Orthopedics was consulted.    Consultants: Orthopedics.  Critical care medicine.  Infectious disease  Procedures:   Incision and drainage of the right knee prepatellar bursa and packing with iodoform.  Aspiration of the knee joint.    Subjective/Interval History: Patient complains of pain in the right lower extremity.  Denies any chest pain or shortness of breath.  Pain is currently 7 out of 10 in intensity.  Better than how it was a few days ago but still quite painful.  No nausea or vomiting.    Assessment/Plan:  Strep pyogenes bacteremia/right leg cellulitis/right knee bursitis/sepsis present on admission Patient had sepsis physiology at admission.  Remains tachycardic. Blood culture from 5/8 is growing strep viridans.  Strep pyogenes also noted in right knee aspirate as well as aspirate from the prepatellar bursa. Repeat blood culture sent on 5/10 without any growth so far. Infectious diseases following.  Orthopedics is following. Patient is currently on ceftriaxone . WBC was elevated at 41.7 on 5/9.  Labs are pending from today. Afebrile at this time. Pain medications will be adjusted.  Patient is on as needed narcotics.  Also on gabapentin. Further infectious disease testing and duration of antibiotics to be determined by infectious disease. PT and OT eval.  Microcytic anemia Hemoglobin was 8.8 on 5/9.  Labs are pending from today. Anemia panel shows ferritin of 24, iron 10, TIBC 398, percent saturation 3.  Folic acid 9.9.  Vitamin B12 269. Will benefit from iron supplementation at  discharge. Will start B12 supplementation.  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  Seen by cardiology who think this is likely due to severe infection and sepsis.  Recommend outpatient echocardiogram.  No GDMT for now.  Seems to be stable from a volume standpoint.  Acute kidney injury Came in with creatinine of 1.51.  Creatinine was 0.86 in September 2024.  Monitor urine output.  Avoid nephrotoxic agents.  Await labs from today.  Obesity Estimated body mass index is 44.2 kg/m as calculated from the following:   Height as of this encounter: 5\' 7"  (1.702 m).   Weight as of this encounter: 128 kg.   DVT Prophylaxis: Lovenox  Code Status: Full code Family Communication: Discussed with patient Disposition Plan: To be determined      Medications: Scheduled:  acetaminophen   650 mg Oral Q6H   Chlorhexidine Gluconate Cloth  6 each Topical Daily   clonazePAM  1 mg Oral TID   enoxaparin  (LOVENOX ) injection  50 mg Subcutaneous Q24H   fluticasone furoate-vilanterol  1 puff Inhalation Daily   gabapentin  400 mg Oral TID   insulin  aspart  0-5 Units Subcutaneous QHS   insulin  aspart  0-9 Units Subcutaneous TID WC   Continuous:  cefTRIAXone  (ROCEPHIN )  IV 2 g (06/22/23 0604)   PRN:cyclobenzaprine, docusate sodium , HYDROmorphone  (DILAUDID ) injection, ipratropium-albuterol , oxyCODONE, polyethylene glycol  Antibiotics: Anti-infectives (From admission, onward)    Start     Dose/Rate Route Frequency Ordered Stop   06/21/23 0600  ceFAZolin (ANCEF) IVPB 3g/150 mL premix        3 g 300 mL/hr  over 30 Minutes Intravenous On call to O.R. 06/20/23 1237 06/20/23 1301   06/21/23 0600  cefTRIAXone  (ROCEPHIN ) 2 g in sodium chloride  0.9 % 100 mL IVPB        2 g 200 mL/hr over 30 Minutes Intravenous Every 24 hours 06/21/23 0142     06/20/23 2000  penicillin G potassium 12 Million Units in dextrose 5 % 500 mL CONTINUOUS infusion  Status:  Discontinued        12 Million Units 41.7 mL/hr over 12  Hours Intravenous Every 12 hours 06/20/23 1551 06/21/23 0142   06/20/23 0900  vancomycin  (VANCOCIN ) IVPB 1000 mg/200 mL premix  Status:  Discontinued        1,000 mg 200 mL/hr over 60 Minutes Intravenous Every 12 hours 06/20/23 0759 06/20/23 1551   06/19/23 2200  cefTRIAXone  (ROCEPHIN ) 2 g in sodium chloride  0.9 % 100 mL IVPB  Status:  Discontinued        2 g 200 mL/hr over 30 Minutes Intravenous Every 24 hours 06/19/23 1845 06/20/23 1551   06/19/23 1849  vancomycin  variable dose per unstable renal function (pharmacist dosing)  Status:  Discontinued         Does not apply See admin instructions 06/19/23 1851 06/20/23 0759   06/19/23 1400  vancomycin  (VANCOREADY) IVPB 2000 mg/400 mL        2,000 mg 200 mL/hr over 120 Minutes Intravenous  Once 06/19/23 1350 06/19/23 1702   06/19/23 1400  piperacillin -tazobactam (ZOSYN ) IVPB 3.375 g        3.375 g 100 mL/hr over 30 Minutes Intravenous  Once 06/19/23 1350 06/19/23 1425       Objective:  Vital Signs  Vitals:   06/21/23 2157 06/22/23 0600 06/22/23 0818 06/22/23 0906  BP: 110/73 132/81  110/89  Pulse: (!) 114 (!) 117  (!) 112  Resp: 19 20  20   Temp: 98.7 F (37.1 C) 98.9 F (37.2 C)  99.9 F (37.7 C)  TempSrc: Axillary Axillary  Oral  SpO2: 96% 98% 97% 98%  Weight:      Height:        Intake/Output Summary (Last 24 hours) at 06/22/2023 0941 Last data filed at 06/22/2023 1610 Gross per 24 hour  Intake 300 ml  Output 950 ml  Net -650 ml   Filed Weights   06/19/23 1206 06/20/23 0500 06/21/23 0500  Weight: 115 kg 134.8 kg 128 kg    General appearance: Awake alert.  In no distress Resp: Clear to auscultation bilaterally.  Normal effort Cardio: S1-S2 is normal regular.  No S3-S4.  No rubs murmurs or bruit GI: Abdomen is soft.  Nontender nondistended.  Bowel sounds are present normal.  No masses organomegaly Extremities: Laying of the right lower extremity is noted.  Dressing noted over the right knee.  Erythema is noted.  Warm  to touch.  Limited range of motion due to significant pain. Neurologic: Alert and oriented x3.  No focal neurological deficits.    Lab Results:  Data Reviewed: I have personally reviewed following labs and reports of the imaging studies  CBC: Recent Labs  Lab 06/19/23 1349 06/19/23 1402 06/19/23 2055 06/20/23 0230  WBC 25.8*  --  30.7* 41.7*  NEUTROABS 22.9*  --   --   --   HGB 9.4* 10.9* 9.3* 8.8*  HCT 33.8* 32.0* 33.4* 31.9*  MCV 70.1*  --  69.9* 69.5*  PLT 462*  --  376 364    Basic Metabolic Panel: Recent Labs  Lab 06/19/23  1348 06/19/23 1349 06/19/23 1402 06/19/23 2055 06/20/23 0230  NA  --  142 141  --  141  K  --  3.3* 3.2*  --  3.6  CL  --  107  --   --  108  CO2  --  24  --   --  20*  GLUCOSE  --  131*  --   --  94  BUN  --  14  --   --  14  CREATININE  --  1.51*  --  1.58* 1.53*  CALCIUM   --  8.1*  --   --  7.7*  MG 1.8  --   --   --  1.5*  PHOS  --   --   --   --  5.2*    GFR: Estimated Creatinine Clearance: 67.4 mL/min (A) (by C-G formula based on SCr of 1.53 mg/dL (H)).  Liver Function Tests: Recent Labs  Lab 06/19/23 1349 06/20/23 0230  AST 47* 36  ALT 48* 40  ALKPHOS 70 63  BILITOT 1.1 1.2  PROT 6.1* 5.4*  ALBUMIN 3.4* 2.8*     Coagulation Profile: Recent Labs  Lab 06/19/23 1349  INR 1.2     HbA1C: Recent Labs    06/19/23 2055  HGBA1C 5.5    CBG: Recent Labs  Lab 06/21/23 0814 06/21/23 1123 06/21/23 1614 06/21/23 2128 06/22/23 0654  GLUCAP 145* 142* 145* 166* 107*    Anemia Panel: Recent Labs    06/20/23 0230  VITAMINB12 269  FOLATE 9.9  FERRITIN 24  TIBC 398  IRON 10*    Recent Results (from the past 240 hours)  Blood Culture (routine x 2)     Status: None (Preliminary result)   Collection Time: 06/19/23 12:09 PM   Specimen: BLOOD LEFT ARM  Result Value Ref Range Status   Specimen Description BLOOD LEFT ARM  Final   Special Requests   Final    AEROBIC BOTTLE ONLY Blood Culture results may not be  optimal due to an inadequate volume of blood received in culture bottles   Culture   Final    NO GROWTH 3 DAYS Performed at [facility], [address] [place], [place] [address]    Report Status PENDING  Incomplete  Resp panel by RT-PCR (RSV, Flu A&B, Covid) Anterior Nasal Swab     Status: None   Collection Time: 06/19/23 12:10 PM   Specimen: Anterior Nasal Swab  Result Value Ref Range Status   SARS Coronavirus 2 by RT PCR NEGATIVE NEGATIVE Final   Influenza A by PCR NEGATIVE NEGATIVE Final   Influenza B by PCR NEGATIVE NEGATIVE Final    Comment: (NOTE) The Xpert Xpress SARS-CoV-2/FLU/RSV plus assay is intended as an aid in the diagnosis of influenza from Nasopharyngeal swab specimens and should not be used as a sole basis for treatment. Nasal washings and aspirates are unacceptable for Xpert Xpress SARS-CoV-2/FLU/RSV testing.  Fact Sheet for Patients: BloggerCourse.com  Fact Sheet for Healthcare Providers: SeriousBroker.it  This test is not yet approved or cleared by the [place]  FDA and has been authorized for detection and/or diagnosis of SARS-CoV-2 by FDA under an Emergency Use Authorization (EUA). This EUA will remain in effect (meaning this test can be used) for the duration of the COVID-19 declaration under Section 564(b)(1) of the Act, 21 U.S.C. section 360bbb-3(b)(1), unless the authorization is terminated or revoked.     Resp Syncytial Virus by PCR NEGATIVE NEGATIVE Final  Comment: (NOTE) Fact Sheet for Patients: BloggerCourse.com  Fact Sheet for Healthcare Providers: SeriousBroker.it  This test is not yet approved or cleared by the [place]  FDA and has been authorized for detection and/or diagnosis of SARS-CoV-2 by FDA under an Emergency Use Authorization (EUA). This EUA will remain in effect (meaning this test can be used) for the duration of  the COVID-19 declaration under Section 564(b)(1) of the Act, 21 U.S.C. section 360bbb-3(b)(1), unless the authorization is terminated or revoked.  Performed at [facility], [address], [place], [place] [address]   Blood Culture (routine x 2)     Status: Abnormal   Collection Time: 06/19/23 12:14 PM   Specimen: BLOOD LEFT ARM  Result Value Ref Range Status   Specimen Description BLOOD LEFT ARM  Final   Special Requests   Final    BOTTLES DRAWN AEROBIC AND ANAEROBIC Blood Culture results may not be optimal due to an inadequate volume of blood received in culture bottles   Culture  Setup Time   Final    GRAM POSITIVE COCCI ANAEROBIC BOTTLE ONLY CRITICAL RESULT CALLED TO, READ BACK BY AND VERIFIED WITH: J [facility]  06/20/23 MK Performed at [facility], [address], [place], [place] [address]    Culture VIRIDANS STREPTOCOCCUS (A)  Final   Report Status 06/22/2023 FINAL  Final   Organism ID, Bacteria VIRIDANS STREPTOCOCCUS  Final      Susceptibility   Viridans streptococcus - MIC*    PENICILLIN 0.25 INTERMEDIATE Intermediate     CEFTRIAXONE  0.25 SENSITIVE Sensitive     ERYTHROMYCIN <=0.12 SENSITIVE Sensitive     LEVOFLOXACIN 1 SENSITIVE Sensitive     VANCOMYCIN  0.5 SENSITIVE Sensitive     * VIRIDANS STREPTOCOCCUS  Blood Culture ID Panel (Reflexed)     Status: Abnormal   Collection Time: 06/19/23 12:14 PM  Result Value Ref Range Status   Enterococcus faecalis NOT DETECTED NOT DETECTED Final   Enterococcus Faecium NOT DETECTED NOT DETECTED Final   Listeria monocytogenes NOT DETECTED NOT DETECTED Final   Staphylococcus species NOT DETECTED NOT DETECTED Final   Staphylococcus aureus (BCID) NOT DETECTED NOT DETECTED Final   Staphylococcus epidermidis NOT DETECTED NOT DETECTED Final   Staphylococcus lugdunensis NOT DETECTED NOT DETECTED Final   Streptococcus species DETECTED (A) NOT DETECTED Final    Comment: Not Enterococcus species, Streptococcus  agalactiae, Streptococcus pyogenes, or Streptococcus pneumoniae. CRITICAL RESULT CALLED TO, READ BACK BY AND VERIFIED WITH: J WYLAND,[email or web address]  06/20/23 MK    Streptococcus agalactiae NOT DETECTED NOT DETECTED Final   Streptococcus pneumoniae NOT DETECTED NOT DETECTED Final   Streptococcus pyogenes NOT DETECTED NOT DETECTED Final   A.calcoaceticus-baumannii NOT DETECTED NOT DETECTED Final   Bacteroides fragilis NOT DETECTED NOT DETECTED Final   Enterobacterales NOT DETECTED NOT DETECTED Final   Enterobacter cloacae complex NOT DETECTED NOT DETECTED Final   Escherichia coli NOT DETECTED NOT DETECTED Final   Klebsiella aerogenes NOT DETECTED NOT DETECTED Final   Klebsiella oxytoca NOT DETECTED NOT DETECTED Final   Klebsiella pneumoniae NOT DETECTED NOT DETECTED Final   Proteus species NOT DETECTED NOT DETECTED Final   Salmonella species NOT DETECTED NOT DETECTED Final   Serratia marcescens NOT DETECTED NOT DETECTED Final   Haemophilus influenzae NOT DETECTED NOT DETECTED Final   Neisseria meningitidis NOT DETECTED NOT DETECTED Final   Pseudomonas aeruginosa NOT DETECTED NOT DETECTED Final   Stenotrophomonas maltophilia NOT DETECTED NOT DETECTED Final   Candida albicans NOT DETECTED NOT DETECTED Final  Candida auris NOT DETECTED NOT DETECTED Final   Candida glabrata NOT DETECTED NOT DETECTED Final   Candida krusei NOT DETECTED NOT DETECTED Final   Candida parapsilosis NOT DETECTED NOT DETECTED Final   Candida tropicalis NOT DETECTED NOT DETECTED Final   Cryptococcus neoformans/gattii NOT DETECTED NOT DETECTED Final    Comment: Performed at [facility], [address], [place], [place] [address]  Body fluid culture w Gram Stain     Status: None   Collection Time: 06/19/23  1:02 PM   Specimen: Body Fluid  Result Value Ref Range Status   Specimen Description FLUID  Final   Special Requests RIGHT KNEE  Final   Gram Stain   Final    RARE WBC PRESENT,BOTH PMN AND MONONUCLEAR NO  ORGANISMS SEEN    Culture   Final    RARE STREPTOCOCCUS PYOGENES Beta hemolytic streptococci are predictably susceptible to penicillin and other beta lactams. Susceptibility testing not routinely performed. Performed at [facility], [address] [place], [place] [address]    Report Status 06/21/2023 FINAL  Final  MRSA Next Gen by PCR, Nasal     Status: None   Collection Time: 06/19/23  5:56 PM   Specimen: Nasal Mucosa; Nasal Swab  Result Value Ref Range Status   MRSA by PCR Next Gen NOT DETECTED NOT DETECTED Final    Comment: (NOTE) The GeneXpert MRSA Assay (FDA approved for NASAL specimens only), is one component of a comprehensive MRSA colonization surveillance program. It is not intended to diagnose MRSA infection nor to guide or monitor treatment for MRSA infections. Test performance is not FDA approved in patients less than 34 years old. Performed at [facility], [address], [place], [place] [address]   Aerobic/Anaerobic Culture w Gram Stain (surgical/deep wound)     Status: None (Preliminary result)   Collection Time: 06/20/23  1:29 PM   Specimen: Wound  Result Value Ref Range Status   Specimen Description WOUND  Final   Special Requests RIGHT PREPATELLAR BURSA  Final   Gram Stain NO WBC SEEN GRAM POSITIVE COCCI IN PAIRS   Final   Culture   Final    RARE GROUP A STREP (S.PYOGENES) ISOLATED Beta hemolytic streptococci are predictably susceptible to penicillin and other beta lactams. Susceptibility testing not routinely performed. Performed at [facility], [address], [place], [place] [address]    Report Status PENDING  Incomplete  Aerobic/Anaerobic Culture w Gram Stain (surgical/deep wound)     Status: None (Preliminary result)   Collection Time: 06/20/23  1:34 PM   Specimen: Wound; Body Fluid  Result Value Ref Range Status   Specimen Description WOUND  Final   Special Requests RIGHT KNEE ASPIRATION  Final   Gram Stain NO WBC SEEN NO  ORGANISMS SEEN   Final   Culture   Final    NO GROWTH 2 DAYS Performed at [facility], [address] [place], [place] [address]    Report Status PENDING  Incomplete  Culture, blood (Routine X 2) w Reflex to ID Panel     Status: None (Preliminary result)   Collection Time: 06/21/23 10:22 AM   Specimen: BLOOD LEFT HAND  Result Value Ref Range Status   Specimen Description BLOOD LEFT HAND  Final   Special Requests   Final    BOTTLES DRAWN AEROBIC AND ANAEROBIC Blood Culture results may not be optimal due to an inadequate volume of blood received in culture  bottles   Culture   Final    NO GROWTH < 24 HOURS Performed at [facility], [address] [place], [place] [address]    Report Status PENDING  Incomplete  Culture, blood (Routine X 2) w Reflex to ID Panel     Status: None (Preliminary result)   Collection Time: 06/21/23 10:28 AM   Specimen: BLOOD RIGHT HAND  Result Value Ref Range Status   Specimen Description BLOOD RIGHT HAND  Final   Special Requests   Final    BOTTLES DRAWN AEROBIC AND ANAEROBIC Blood Culture results may not be optimal due to an inadequate volume of blood received in culture bottles   Culture   Final    NO GROWTH < 24 HOURS Performed at [facility], [address], [place], [place] [address]    Report Status PENDING  Incomplete      Radiology Studies: ECHOCARDIOGRAM COMPLETE Result Date: 06/20/2023    ECHOCARDIOGRAM REPORT   Patient Name:   Roberta Soto Date of Exam: 06/20/2023 Medical Rec #:  [id number]     Height:       67.0 in Accession #:    [id number]    Weight:       297.2 lb Date of Birth:  23-Sep-1980     BSA:          2.393 m Patient Age:    42 years      BP:           107/85 mmHg Patient Gender: F             HR:           98 bpm. Exam Location:  Inpatient Procedure: 2D Echo (Both Spectral and Color Flow Doppler were utilized during            procedure). Indications:    sepsis  History:        Patient has no prior history of  Echocardiogram examinations.                 Signs/Symptoms:Altered Mental Status.  Sonographer:    Dione Franks RDCS Referring Phys: [id number] LAURA R GLEASON IMPRESSIONS  1. Left ventricular ejection fraction, by estimation, is 30 to 35%. The left ventricle has moderately decreased function. The left ventricle demonstrates global hypokinesis. The left ventricular internal cavity size was moderately dilated. Left ventricular diastolic parameters were normal.  2. Right ventricular systolic function is normal. The right ventricular size is normal. There is normal pulmonary artery systolic pressure.  3. Left atrial size was moderately dilated.  4. The mitral valve is abnormal. Mild mitral valve regurgitation. No evidence of mitral stenosis.  5. The aortic valve is tricuspid. Aortic valve regurgitation is not visualized. No aortic stenosis is present.  6. The inferior vena cava is normal in size with greater than 50% respiratory variability, suggesting right atrial pressure of 3 mmHg. FINDINGS  Left Ventricle: Left ventricular ejection fraction, by estimation, is 30 to 35%. The left ventricle has moderately decreased function. The left ventricle demonstrates global hypokinesis. Strain was performed and the global longitudinal strain is indeterminate. The left ventricular internal cavity size was moderately dilated. There is no left ventricular hypertrophy. Left ventricular diastolic parameters were normal. Right Ventricle: The right ventricular size is normal. No increase in right ventricular wall thickness. Right ventricular systolic function is normal. There is normal pulmonary artery systolic pressure. The tricuspid regurgitant velocity is 2.65 m/s, and  with an assumed right  atrial pressure of 3 mmHg, the estimated right ventricular systolic pressure is 31.1 mmHg. Left Atrium: Left atrial size was moderately dilated. Right Atrium: Right atrial size was normal in size. Pericardium: There is no evidence of  pericardial effusion. Mitral Valve: The mitral valve is abnormal. There is mild thickening of the mitral valve leaflet(s). Mild mitral valve regurgitation. No evidence of mitral valve stenosis. Tricuspid Valve: The tricuspid valve is normal in structure. Tricuspid valve regurgitation is mild . No evidence of tricuspid stenosis. Aortic Valve: The aortic valve is tricuspid. Aortic valve regurgitation is not visualized. No aortic stenosis is present. Pulmonic Valve: The pulmonic valve was normal in structure. Pulmonic valve regurgitation is not visualized. No evidence of pulmonic stenosis. Aorta: The aortic root is normal in size and structure. Venous: The inferior vena cava is normal in size with greater than 50% respiratory variability, suggesting right atrial pressure of 3 mmHg. IAS/Shunts: No atrial level shunt detected by color flow Doppler. Additional Comments: 3D was performed not requiring image post processing on an independent workstation and was indeterminate.  LEFT VENTRICLE PLAX 2D LVIDd:         6.50 cm      Diastology LVIDs:         5.50 cm      LV e' medial:  6.74 cm/s LV PW:         1.00 cm      LV e' lateral: 10.40 cm/s LV IVS:        1.10 cm LVOT diam:     2.20 cm LV SV:         76 LV SV Index:   32 LVOT Area:     3.80 cm  LV Volumes (MOD) LV vol d, MOD A2C: 150.0 ml LV vol d, MOD A4C: 134.0 ml LV vol s, MOD A2C: 99.0 ml LV vol s, MOD A4C: 103.0 ml LV SV MOD A2C:     51.0 ml LV SV MOD A4C:     134.0 ml LV SV MOD BP:      43.9 ml RIGHT VENTRICLE             IVC RV Basal diam:  3.30 cm     IVC diam: 1.90 cm RV S prime:     15.80 cm/s TAPSE (M-mode): 2.7 cm LEFT ATRIUM              Index        RIGHT ATRIUM           Index LA diam:        4.40 cm  1.84 cm/m   RA Area:     15.10 cm LA Vol (A2C):   102.0 ml 42.63 ml/m  RA Volume:   35.00 ml  14.63 ml/m LA Vol (A4C):   60.2 ml  25.16 ml/m LA Biplane Vol: 81.5 ml  34.06 ml/m  AORTIC VALVE LVOT Vmax:   118.00 cm/s LVOT Vmean:  80.500 cm/s LVOT VTI:     0.200 m  AORTA Ao Root diam: 3.20 cm Ao Asc diam:  2.90 cm MR Peak grad:    106.1 mmHg   TRICUSPID VALVE MR Mean grad:    73.0 mmHg    TR Peak grad:   28.1 mmHg MR Vmax:         515.00 cm/s  TR Vmax:        265.00 cm/s MR Vmean:        408.0 cm/s MR PISA:  0.57 cm     SHUNTS MR PISA Eff ROA: 4 mm        Systemic VTI:  0.20 m MR PISA Radius:  0.30 cm      Systemic Diam: 2.20 cm Janelle Mediate MD Electronically signed by Janelle Mediate MD Signature Date/Time: 06/20/2023/5:51:58 PM    Final        LOS: 3 days   Maylene Spear  [facility] Pager on [email or web address]  06/22/2023, 9:41 AM

## 2023-06-22 NOTE — Progress Notes (Signed)
 Patient ID: Roberta Soto, female   DOB: 09/06/1980, 43 y.o.   MRN: [id number] Patient is status post debridement prepatellar bursa infection.  Cultures are pending.  Patient without complaints this morning.

## 2023-06-23 ENCOUNTER — Telehealth: Payer: Self-pay | Admitting: Orthopaedic Surgery

## 2023-06-23 DIAGNOSIS — I519 Heart disease, unspecified: Secondary | ICD-10-CM

## 2023-06-23 DIAGNOSIS — I10 Essential (primary) hypertension: Secondary | ICD-10-CM

## 2023-06-23 DIAGNOSIS — A419 Sepsis, unspecified organism: Secondary | ICD-10-CM | POA: Diagnosis not present

## 2023-06-23 DIAGNOSIS — B955 Unspecified streptococcus as the cause of diseases classified elsewhere: Secondary | ICD-10-CM | POA: Diagnosis not present

## 2023-06-23 DIAGNOSIS — E66813 Obesity, class 3: Secondary | ICD-10-CM

## 2023-06-23 DIAGNOSIS — R7881 Bacteremia: Secondary | ICD-10-CM | POA: Diagnosis not present

## 2023-06-23 DIAGNOSIS — M71061 Abscess of bursa, right knee: Secondary | ICD-10-CM | POA: Diagnosis not present

## 2023-06-23 DIAGNOSIS — Z6841 Body Mass Index (BMI) 40.0 and over, adult: Secondary | ICD-10-CM

## 2023-06-23 LAB — GLUCOSE, CAPILLARY
Glucose-Capillary: 94 mg/dL (ref 70–99)
Glucose-Capillary: 114 mg/dL — ABNORMAL HIGH (ref 70–99)
Glucose-Capillary: 104 mg/dL — ABNORMAL HIGH (ref 70–99)
Glucose-Capillary: 104 mg/dL — ABNORMAL HIGH (ref 70–99)

## 2023-06-23 LAB — CBC
HCT: 27.7 % — ABNORMAL LOW (ref 36.0–46.0)
Hemoglobin: 7.9 g/dL — ABNORMAL LOW (ref 12.0–15.0)
MCH: 19.5 pg — ABNORMAL LOW (ref 26.0–34.0)
MCHC: 28.5 g/dL — ABNORMAL LOW (ref 30.0–36.0)
MCV: 68.2 fL — ABNORMAL LOW (ref 80.0–100.0)
Platelets: 402 10*3/uL — ABNORMAL HIGH (ref 150–400)
RBC: 4.06 MIL/uL (ref 3.87–5.11)
RDW: 18.6 % — ABNORMAL HIGH (ref 11.5–15.5)
WBC: 21.3 10*3/uL — ABNORMAL HIGH (ref 4.0–10.5)
nRBC: 0 % (ref 0.0–0.2)

## 2023-06-23 LAB — BASIC METABOLIC PANEL WITH GFR
Anion gap: 10 (ref 5–15)
BUN: 9 mg/dL (ref 6–20)
CO2: 23 mmol/L (ref 22–32)
Calcium: 8.2 mg/dL — ABNORMAL LOW (ref 8.9–10.3)
Chloride: 106 mmol/L (ref 98–111)
Creatinine, Ser: 0.96 mg/dL (ref 0.44–1.00)
GFR, Estimated: >60 mL/min (ref >60)
Glucose, Bld: 94 mg/dL (ref 70–99)
Potassium: 3.5 mmol/L (ref 3.5–5.1)
Sodium: 139 mmol/L (ref 135–145)

## 2023-06-23 LAB — GLUCOSE, BODY FLUID OTHER

## 2023-06-23 LAB — MAGNESIUM: Magnesium: 1.9 mg/dL (ref 1.7–2.4)

## 2023-06-23 LAB — PROTEIN, BODY FLUID (OTHER)

## 2023-06-23 MED ORDER — CLONAZEPAM 0.5 MG PO TABS
0.5000 mg | ORAL_TABLET | Freq: Three times a day (TID) | ORAL | Status: DC
  Administered 2023-06-23 – 2023-06-29 (×17): 0.5 mg via ORAL
  Filled 2023-06-23 (×17): qty 1

## 2023-06-23 MED ORDER — LOSARTAN POTASSIUM 50 MG PO TABS
25.0000 mg | ORAL_TABLET | Freq: Every day | ORAL | Status: DC
  Filled 2023-06-23: qty 1

## 2023-06-23 MED ORDER — GABAPENTIN 400 MG PO CAPS
400.0000 mg | ORAL_CAPSULE | Freq: Three times a day (TID) | ORAL | Status: DC
  Administered 2023-06-24 – 2023-06-29 (×16): 400 mg via ORAL
  Filled 2023-06-23 (×16): qty 1

## 2023-06-23 MED ORDER — LOSARTAN POTASSIUM 50 MG PO TABS
25.0000 mg | ORAL_TABLET | Freq: Every evening | ORAL | Status: DC
  Administered 2023-06-24 – 2023-06-26 (×3): 25 mg via ORAL
  Filled 2023-06-23 (×3): qty 1

## 2023-06-23 MED ORDER — SENNOSIDES-DOCUSATE SODIUM 8.6-50 MG PO TABS
2.0000 | ORAL_TABLET | Freq: Two times a day (BID) | ORAL | Status: DC
  Administered 2023-06-23 – 2023-06-28 (×8): 2 via ORAL
  Filled 2023-06-23 (×13): qty 2

## 2023-06-23 MED ORDER — LINEZOLID 600 MG PO TABS
600.0000 mg | ORAL_TABLET | Freq: Two times a day (BID) | ORAL | Status: DC
  Administered 2023-06-23 – 2023-06-29 (×12): 600 mg via ORAL
  Filled 2023-06-23 (×15): qty 1

## 2023-06-23 MED ORDER — METOPROLOL SUCCINATE ER 25 MG PO TB24
12.5000 mg | ORAL_TABLET | Freq: Every day | ORAL | Status: DC
  Administered 2023-06-23: 12.5 mg via ORAL
  Filled 2023-06-23 (×2): qty 1

## 2023-06-23 MED ORDER — POTASSIUM CHLORIDE CRYS ER 20 MEQ PO TBCR
40.0000 meq | EXTENDED_RELEASE_TABLET | Freq: Once | ORAL | Status: AC
  Administered 2023-06-23: 40 meq via ORAL
  Filled 2023-06-23: qty 2

## 2023-06-23 MED ORDER — POLYETHYLENE GLYCOL 3350 17 G PO PACK
17.0000 g | PACK | Freq: Every day | ORAL | Status: DC
  Administered 2023-06-23: 17 g via ORAL
  Filled 2023-06-23 (×6): qty 1

## 2023-06-23 MED ORDER — FUROSEMIDE 10 MG/ML IJ SOLN
20.0000 mg | Freq: Every day | INTRAMUSCULAR | Status: DC
  Administered 2023-06-23 – 2023-06-29 (×7): 20 mg via INTRAVENOUS
  Filled 2023-06-23 (×7): qty 2

## 2023-06-23 MED ORDER — ENOXAPARIN SODIUM 60 MG/0.6ML IJ SOSY
60.0000 mg | PREFILLED_SYRINGE | INTRAMUSCULAR | Status: DC
  Administered 2023-06-23 – 2023-06-28 (×6): 60 mg via SUBCUTANEOUS
  Filled 2023-06-23 (×6): qty 0.6

## 2023-06-23 MED ORDER — LINEZOLID 600 MG PO TABS: 600.0000 mg | ORAL_TABLET | Freq: Two times a day (BID) | ORAL | Status: DC

## 2023-06-23 NOTE — Progress Notes (Signed)
 [facility] PROGRESS NOTE   Roberta Soto ZOX:[id number] DOB: 06-16-1980 DOA: 06/19/2023  PCP: Patient, No Pcp Per  Brief History: 43 y.o. F with PMH significant for asthma and HTN who presented to the ED with confusion and R knee pain.  Patient was noted to be quite agitated and tachycardic.  She was initially admitted to the ICU.  There was concern for right knee infection.  Orthopedics was consulted.    Consultants: Orthopedics.  Critical care medicine.  Infectious disease  Procedures:   Incision and drainage of the right knee prepatellar bursa and packing with iodoform.  Aspiration of the knee joint.    Subjective/Interval History: Patient complains of 9 out of 10 pain in the right lower extremity.  No chest pain or shortness of breath.  No nausea or vomiting.     Assessment/Plan:  Strep pyogenes bacteremia/right leg cellulitis/right knee bursitis/sepsis present on admission Patient had sepsis physiology at admission.  Remains tachycardic. Blood culture from 5/8 is growing strep viridans.  Strep pyogenes also noted in right knee aspirate as well as aspirate from the prepatellar bursa. Repeat blood culture sent on 5/10 without any growth so far. Infectious diseases following.  Orthopedics is following. Patient is currently on ceftriaxone . WBC was elevated at 41.7 on 5/9.  Improvement noted in WBC.  Noted to be 21.3 today.   Pain medications were adjusted yesterday.  She is on as needed narcotics and on gabapentin. Further infectious disease testing and duration of antibiotics to be determined by infectious disease. PT and OT eval.  Microcytic anemia Hemoglobin was 8.8 on 5/9.  Hemoglobin noted to be 7.9.  Drop in hemoglobin is likely dilutional. Anemia panel shows ferritin of 24, iron 10, TIBC 398, percent saturation 3.  Folic acid 9.9.  Vitamin B12 269. Will benefit from iron supplementation at discharge. Started on B12 supplementation  LV dysfunction EF noted to  be 30 to 35%.  Seen by cardiology who think this is likely due to severe infection and sepsis.   Management per cardiology.  GDMT per cardiology.  Acute kidney injury/hypokalemia Came in with creatinine of 1.51.  Creatinine was 0.86 in September 2024.  Monitor urine output.  Creatinine is now back to baseline.  Monitor urine output. Supplement potassium.  Magnesium  noted to be 1.9.  Obesity Estimated body mass index is 44.2 kg/m as calculated from the following:   Height as of this encounter: 5\' 7"  (1.702 m).   Weight as of this encounter: 128 kg.   DVT Prophylaxis: Lovenox  Code Status: Full code Family Communication: Discussed with patient Disposition Plan: To be determined      Medications: Scheduled:  acetaminophen   650 mg Oral Q6H   Chlorhexidine Gluconate Cloth  6 each Topical Daily   clonazePAM  1 mg Oral TID   vitamin B-12  1,000 mcg Oral Daily   enoxaparin  (LOVENOX ) injection  50 mg Subcutaneous Q24H   escitalopram  10 mg Oral Daily   fluticasone furoate-vilanterol  1 puff Inhalation Daily   furosemide  20 mg Intravenous Daily   gabapentin  400 mg Oral TID   insulin  aspart  0-5 Units Subcutaneous QHS   insulin  aspart  0-9 Units Subcutaneous TID WC   losartan  25 mg Oral Daily   metoprolol succinate  12.5 mg Oral Daily   Continuous:  cefTRIAXone  (ROCEPHIN )  IV 2 g (06/23/23 0603)   PRN:cyclobenzaprine, docusate sodium , HYDROmorphone  (DILAUDID ) injection, ipratropium-albuterol , oxyCODONE, polyethylene glycol  Antibiotics: Anti-infectives (From admission, onward)  Start     Dose/Rate Route Frequency Ordered Stop   06/21/23 0600  ceFAZolin (ANCEF) IVPB 3g/150 mL premix        3 g 300 mL/hr over 30 Minutes Intravenous On call to O.R. 06/20/23 1237 06/20/23 1301   06/21/23 0600  cefTRIAXone  (ROCEPHIN ) 2 g in sodium chloride  0.9 % 100 mL IVPB        2 g 200 mL/hr over 30 Minutes Intravenous Every 24 hours 06/21/23 0142     06/20/23 2000  penicillin G potassium  12 Million Units in dextrose 5 % 500 mL CONTINUOUS infusion  Status:  Discontinued        12 Million Units 41.7 mL/hr over 12 Hours Intravenous Every 12 hours 06/20/23 1551 06/21/23 0142   06/20/23 0900  vancomycin  (VANCOCIN ) IVPB 1000 mg/200 mL premix  Status:  Discontinued        1,000 mg 200 mL/hr over 60 Minutes Intravenous Every 12 hours 06/20/23 0759 06/20/23 1551   06/19/23 2200  cefTRIAXone  (ROCEPHIN ) 2 g in sodium chloride  0.9 % 100 mL IVPB  Status:  Discontinued        2 g 200 mL/hr over 30 Minutes Intravenous Every 24 hours 06/19/23 1845 06/20/23 1551   06/19/23 1849  vancomycin  variable dose per unstable renal function (pharmacist dosing)  Status:  Discontinued         Does not apply See admin instructions 06/19/23 1851 06/20/23 0759   06/19/23 1400  vancomycin  (VANCOREADY) IVPB 2000 mg/400 mL        2,000 mg 200 mL/hr over 120 Minutes Intravenous  Once 06/19/23 1350 06/19/23 1702   06/19/23 1400  piperacillin -tazobactam (ZOSYN ) IVPB 3.375 g        3.375 g 100 mL/hr over 30 Minutes Intravenous  Once 06/19/23 1350 06/19/23 1425       Objective:  Vital Signs  Vitals:   06/22/23 1632 06/22/23 2100 06/23/23 0430 06/23/23 0813  BP: 113/82 100/83 (!) 123/108 124/89  Pulse: (!) 110 (!) 111 (!) 116 (!) 112  Resp: 19 18 18 19   Temp: 99 F (37.2 C) 98.9 F (37.2 C) 98.6 F (37 C) 98.2 F (36.8 C)  TempSrc: Oral Oral Oral Oral  SpO2: 96% 96% 96% 99%  Weight:      Height:        Intake/Output Summary (Last 24 hours) at 06/23/2023 0959 Last data filed at 06/23/2023 2952 Gross per 24 hour  Intake --  Output 1000 ml  Net -1000 ml   Filed Weights   06/19/23 1206 06/20/23 0500 06/21/23 0500  Weight: 115 kg 134.8 kg 128 kg    General appearance: Awake alert.  In no distress Resp: Clear to auscultation bilaterally.  Normal effort Cardio: S1-S2 is normal regular.  No S3-S4.  No rubs murmurs or bruit GI: Abdomen is soft.  Nontender nondistended.  Bowel sounds are present  normal.  No masses organomegaly Extremities: Swollen right lower extremity noted with erythema warm to touch.  Dressing noted over the right knee.  Lab Results:  Data Reviewed: I have personally reviewed following labs and reports of the imaging studies  CBC: Recent Labs  Lab 06/19/23 1349 06/19/23 1402 06/19/23 2055 06/20/23 0230 06/22/23 1031 06/23/23 0508  WBC 25.8*  --  30.7* 41.7* 31.7* 21.3*  NEUTROABS 22.9*  --   --   --  27.6*  --   HGB 9.4* 10.9* 9.3* 8.8* 7.9* 7.9*  HCT 33.8* 32.0* 33.4* 31.9* 28.0* 27.7*  MCV 70.1*  --  69.9* 69.5* 68.6* 68.2*  PLT 462*  --  376 364 386 402*    Basic Metabolic Panel: Recent Labs  Lab 06/19/23 1348 06/19/23 1349 06/19/23 1402 06/19/23 2055 06/20/23 0230 06/22/23 1031 06/23/23 0508  NA  --  142 141  --  141 138 139  K  --  3.3* 3.2*  --  3.6 3.2* 3.5  CL  --  107  --   --  108 104 106  CO2  --  24  --   --  20* 23 23  GLUCOSE  --  131*  --   --  94 124* 94  BUN  --  14  --   --  14 11 9   CREATININE  --  1.51*  --  1.58* 1.53* 1.16* 0.96  CALCIUM   --  8.1*  --   --  7.7* 8.1* 8.2*  MG 1.8  --   --   --  1.5*  --  1.9  PHOS  --   --   --   --  5.2*  --   --     GFR: Estimated Creatinine Clearance: 107.4 mL/min (by C-G formula based on SCr of 0.96 mg/dL).  Liver Function Tests: Recent Labs  Lab 06/19/23 1349 06/20/23 0230  AST 47* 36  ALT 48* 40  ALKPHOS 70 63  BILITOT 1.1 1.2  PROT 6.1* 5.4*  ALBUMIN 3.4* 2.8*     Coagulation Profile: Recent Labs  Lab 06/19/23 1349  INR 1.2    CBG: Recent Labs  Lab 06/22/23 0654 06/22/23 1203 06/22/23 1626 06/22/23 2149 06/23/23 0638  GLUCAP 107* 153* 105* 135* 94     Recent Results (from the past 240 hours)  Blood Culture (routine x 2)     Status: None (Preliminary result)   Collection Time: 06/19/23 12:09 PM   Specimen: BLOOD LEFT ARM  Result Value Ref Range Status   Specimen Description BLOOD LEFT ARM  Final   Special Requests   Final    AEROBIC BOTTLE  ONLY Blood Culture results may not be optimal due to an inadequate volume of blood received in culture bottles   Culture   Final    NO GROWTH 4 DAYS Performed at [facility] Lab, [address] [place], [place] [address]    Report Status PENDING  Incomplete  Resp panel by RT-PCR (RSV, Flu A&B, Covid) Anterior Nasal Swab     Status: None   Collection Time: 06/19/23 12:10 PM   Specimen: Anterior Nasal Swab  Result Value Ref Range Status   SARS Coronavirus 2 by RT PCR NEGATIVE NEGATIVE Final   Influenza A by PCR NEGATIVE NEGATIVE Final   Influenza B by PCR NEGATIVE NEGATIVE Final    Comment: (NOTE) The Xpert Xpress SARS-CoV-2/FLU/RSV plus assay is intended as an aid in the diagnosis of influenza from Nasopharyngeal swab specimens and should not be used as a sole basis for treatment. Nasal washings and aspirates are unacceptable for Xpert Xpress SARS-CoV-2/FLU/RSV testing.  Fact Sheet for Patients: BloggerCourse.com  Fact Sheet for Healthcare Providers: SeriousBroker.it  This test is not yet approved or cleared by the [place]  FDA and has been authorized for detection and/or diagnosis of SARS-CoV-2 by FDA under an Emergency Use Authorization (EUA). This EUA will remain in effect (meaning this test can be used) for the duration of the COVID-19 declaration under Section 564(b)(1) of the Act, 21 U.S.C. section 360bbb-3(b)(1), unless the authorization is terminated or revoked.  Resp Syncytial Virus by PCR NEGATIVE NEGATIVE Final    Comment: (NOTE) Fact Sheet for Patients: BloggerCourse.com  Fact Sheet for Healthcare Providers: SeriousBroker.it  This test is not yet approved or cleared by the [place]  FDA and has been authorized for detection and/or diagnosis of SARS-CoV-2 by FDA under an Emergency Use Authorization (EUA). This EUA will remain in effect (meaning this  test can be used) for the duration of the COVID-19 declaration under Section 564(b)(1) of the Act, 21 U.S.C. section 360bbb-3(b)(1), unless the authorization is terminated or revoked.  Performed at [facility] Lab, [address] [place], [place] [address]   Blood Culture (routine x 2)     Status: Abnormal   Collection Time: 06/19/23 12:14 PM   Specimen: BLOOD LEFT ARM  Result Value Ref Range Status   Specimen Description BLOOD LEFT ARM  Final   Special Requests   Final    BOTTLES DRAWN AEROBIC AND ANAEROBIC Blood Culture results may not be optimal due to an inadequate volume of blood received in culture bottles   Culture  Setup Time   Final    GRAM POSITIVE COCCI ANAEROBIC BOTTLE ONLY CRITICAL RESULT CALLED TO, READ BACK BY AND VERIFIED WITH: [facility]  06/20/23 MK Performed at [facility], [address] [place], [place] [address]    Culture VIRIDANS STREPTOCOCCUS (A)  Final   Report Status 06/22/2023 FINAL  Final   Organism ID, Bacteria VIRIDANS STREPTOCOCCUS  Final      Susceptibility   Viridans streptococcus - MIC*    PENICILLIN 0.25 INTERMEDIATE Intermediate     CEFTRIAXONE  0.25 SENSITIVE Sensitive     ERYTHROMYCIN <=0.12 SENSITIVE Sensitive     LEVOFLOXACIN 1 SENSITIVE Sensitive     VANCOMYCIN  0.5 SENSITIVE Sensitive     * VIRIDANS STREPTOCOCCUS  Blood Culture ID Panel (Reflexed)     Status: Abnormal   Collection Time: 06/19/23 12:14 PM  Result Value Ref Range Status   Enterococcus faecalis NOT DETECTED NOT DETECTED Final   Enterococcus Faecium NOT DETECTED NOT DETECTED Final   Listeria monocytogenes NOT DETECTED NOT DETECTED Final   Staphylococcus species NOT DETECTED NOT DETECTED Final   Staphylococcus aureus (BCID) NOT DETECTED NOT DETECTED Final   Staphylococcus epidermidis NOT DETECTED NOT DETECTED Final   Staphylococcus lugdunensis NOT DETECTED NOT DETECTED Final   Streptococcus species DETECTED (A) NOT DETECTED Final    Comment: Not  Enterococcus species, Streptococcus agalactiae, Streptococcus pyogenes, or Streptococcus pneumoniae. CRITICAL RESULT CALLED TO, READ BACK BY AND VERIFIED WITH: J WYLAND,[email or web address]  06/20/23 MK    Streptococcus agalactiae NOT DETECTED NOT DETECTED Final   Streptococcus pneumoniae NOT DETECTED NOT DETECTED Final   Streptococcus pyogenes NOT DETECTED NOT DETECTED Final   A.calcoaceticus-baumannii NOT DETECTED NOT DETECTED Final   Bacteroides fragilis NOT DETECTED NOT DETECTED Final   Enterobacterales NOT DETECTED NOT DETECTED Final   Enterobacter cloacae complex NOT DETECTED NOT DETECTED Final   Escherichia coli NOT DETECTED NOT DETECTED Final   Klebsiella aerogenes NOT DETECTED NOT DETECTED Final   Klebsiella oxytoca NOT DETECTED NOT DETECTED Final   Klebsiella pneumoniae NOT DETECTED NOT DETECTED Final   Proteus species NOT DETECTED NOT DETECTED Final   Salmonella species NOT DETECTED NOT DETECTED Final   Serratia marcescens NOT DETECTED NOT DETECTED Final   Haemophilus influenzae NOT DETECTED NOT DETECTED Final   Neisseria meningitidis NOT DETECTED NOT DETECTED Final   Pseudomonas aeruginosa NOT DETECTED NOT DETECTED Final   Stenotrophomonas maltophilia NOT DETECTED NOT DETECTED Final  Candida albicans NOT DETECTED NOT DETECTED Final   Candida auris NOT DETECTED NOT DETECTED Final   Candida glabrata NOT DETECTED NOT DETECTED Final   Candida krusei NOT DETECTED NOT DETECTED Final   Candida parapsilosis NOT DETECTED NOT DETECTED Final   Candida tropicalis NOT DETECTED NOT DETECTED Final   Cryptococcus neoformans/gattii NOT DETECTED NOT DETECTED Final    Comment: Performed at [facility] [place] [facility], [address], [place], [place] [address]  Body fluid culture w Gram Stain     Status: None   Collection Time: 06/19/23  1:02 PM   Specimen: Body Fluid  Result Value Ref Range Status   Specimen Description FLUID  Final   Special Requests RIGHT KNEE  Final   Gram Stain   Final    RARE WBC  PRESENT,BOTH PMN AND MONONUCLEAR NO ORGANISMS SEEN    Culture   Final    RARE STREPTOCOCCUS PYOGENES Beta hemolytic streptococci are predictably susceptible to penicillin and other beta lactams. Susceptibility testing not routinely performed. Performed at [facility], [address], [place], [place] [address]    Report Status 06/21/2023 FINAL  Final  MRSA Next Gen by PCR, Nasal     Status: None   Collection Time: 06/19/23  5:56 PM   Specimen: Nasal Mucosa; Nasal Swab  Result Value Ref Range Status   MRSA by PCR Next Gen NOT DETECTED NOT DETECTED Final    Comment: (NOTE) The GeneXpert MRSA Assay (FDA approved for NASAL specimens only), is one component of a comprehensive MRSA colonization surveillance program. It is not intended to diagnose MRSA infection nor to guide or monitor treatment for MRSA infections. Test performance is not FDA approved in patients less than 19 years old. Performed at [facility], [address], [place], [place] [address]   Aerobic/Anaerobic Culture w Gram Stain (surgical/deep wound)     Status: None (Preliminary result)   Collection Time: 06/20/23  1:29 PM   Specimen: Wound  Result Value Ref Range Status   Specimen Description WOUND  Final   Special Requests RIGHT PREPATELLAR BURSA  Final   Gram Stain   Final    NO WBC SEEN GRAM POSITIVE COCCI IN PAIRS Performed at [facility], [address] [place], [place] [address]    Culture   Final    RARE GROUP A STREP (S.PYOGENES) ISOLATED Beta hemolytic streptococci are predictably susceptible to penicillin and other beta lactams. Susceptibility testing not routinely performed. NO ANAEROBES ISOLATED; CULTURE IN PROGRESS FOR 5 DAYS    Report Status PENDING  Incomplete  Aerobic/Anaerobic Culture w Gram Stain (surgical/deep wound)     Status: None (Preliminary result)   Collection Time: 06/20/23  1:34 PM   Specimen: Wound; Body Fluid  Result Value Ref Range Status   Specimen  Description WOUND  Final   Special Requests RIGHT KNEE ASPIRATION  Final   Gram Stain NO WBC SEEN NO ORGANISMS SEEN   Final   Culture   Final    NO GROWTH 3 DAYS NO ANAEROBES ISOLATED; CULTURE IN PROGRESS FOR 5 DAYS Performed at [facility], [address], [place], [place] [address]    Report Status PENDING  Incomplete  Culture, blood (Routine X 2) w Reflex to ID Panel     Status: None (Preliminary result)   Collection Time: 06/21/23 10:22 AM   Specimen: BLOOD LEFT HAND  Result Value Ref Range Status   Specimen Description BLOOD LEFT HAND  Final  Special Requests   Final    BOTTLES DRAWN AEROBIC AND ANAEROBIC Blood Culture results may not be optimal due to an inadequate volume of blood received in culture bottles   Culture   Final    NO GROWTH 2 DAYS Performed at [facility], [address] [place], [place] [address]    Report Status PENDING  Incomplete  Culture, blood (Routine X 2) w Reflex to ID Panel     Status: None (Preliminary result)   Collection Time: 06/21/23 10:28 AM   Specimen: BLOOD RIGHT HAND  Result Value Ref Range Status   Specimen Description BLOOD RIGHT HAND  Final   Special Requests   Final    BOTTLES DRAWN AEROBIC AND ANAEROBIC Blood Culture results may not be optimal due to an inadequate volume of blood received in culture bottles   Culture   Final    NO GROWTH 2 DAYS Performed at [facility], [address], [place], [place] [address]    Report Status PENDING  Incomplete      Radiology Studies: No results found.      LOS: 4 days   Jerrico Covello [employer] on [email or web address]  06/23/2023, 9:59 AM

## 2023-06-23 NOTE — Progress Notes (Signed)
 Physical Therapy Treatment Patient Details Name: Roberta Soto MRN: [id number] DOB: 1980-10-17 Today's Date: 06/23/2023   History of Present Illness Admitted with R knee septic bursitis; S/p I&D R knee, WBAT, no ROM restrictions; recovery complicated by new, but asymptomatic LV dysfunction;  has a past medical history of Asthma and Hypertension.    PT Comments  Continuing work on functional mobility and activity tolerance;  Session focused on gati and functional transfers, with noted progress in distance, and ability to accept weight  onto RLE in stance; needs incr time to get moving; placed OT order for ADLS    If plan is discharge home, recommend the following: A little help with walking and/or transfers;A little help with bathing/dressing/bathroom;Assistance with cooking/housework   Can travel by Training and development officer (2 wheels);BSC/3in1    Recommendations for Other Services       Precautions / Restrictions Precautions Precautions: Fall Recall of Precautions/Restrictions: Intact Restrictions RLE Weight Bearing Per Provider Order: Weight bearing as tolerated     Mobility  Bed Mobility Overal bed mobility: Needs Assistance Bed Mobility: Supine to Sit     Supine to sit: Min assist     General bed mobility comments: Incr time and use of bedrails; light  assist and use of bed pad to scoot closer to EOB and square off hips at EOB    Transfers Overall transfer level: Needs assistance Equipment used: Rolling walker (2 wheels) Transfers: Sit to/from Stand Sit to Stand: Contact guard assist           General transfer comment: Incr time to try different hand placements for standign from bed; smoother rise from commode wth grab bars    Ambulation/Gait Ambulation/Gait assistance: Contact guard assist Gait Distance (Feet): 15 Feet (x2) Assistive device: Rolling walker (2 wheels) Gait Pattern/deviations: Step-to pattern        General Gait Details: wakling with "hop step" pattern to the commode in the bathroom, keeping NWB; able to accept more weight onto RLE with walking from bathroom to the recliner   Stairs             Wheelchair Mobility     Tilt Bed    Modified Rankin (Stroke Patients Only)       Balance     Sitting balance-Leahy Scale: Fair       Standing balance-Leahy Scale: Poor                              Communication Communication Communication: No apparent difficulties  Cognition Arousal: Alert Behavior During Therapy: WFL for tasks assessed/performed                           PT - Cognition Comments: Occasionally closing eyes, but able to answer questions with eyes closed Following commands: Intact (and tends to want to try a few different ways to do things)      Cueing Cueing Techniques: Verbal cues  Exercises      General Comments General comments (skin integrity, edema, etc.): Assited pt with donning briefs and pads in teh bathroom; she did some self-care at teh sink in sitting and standing before going to recliner      Pertinent Vitals/Pain Pain Assessment Pain Assessment: Faces Faces Pain Scale: Hurts little more Pain Location: R LE Pain Descriptors / Indicators: Grimacing, Guarding Pain Intervention(s): Limited activity within  patient's tolerance    Home Living                          Prior Function            PT Goals (current goals can now be found in the care plan section) Acute Rehab PT Goals Patient Stated Goal: Hopes to get home soon PT Goal Formulation: With patient Time For Goal Achievement: 07/06/23 Potential to Achieve Goals: Good Progress towards PT goals: Progressing toward goals    Frequency    Min 4X/week      PT Plan      Co-evaluation              AM-PAC PT "6 Clicks" Mobility   Outcome Measure  Help needed turning from your back to your side while in a flat bed without using  bedrails?: A Little Help needed moving from lying on your back to sitting on the side of a flat bed without using bedrails?: A Little Help needed moving to and from a bed to a chair (including a wheelchair)?: A Little Help needed standing up from a chair using your arms (e.g., wheelchair or bedside chair)?: A Little Help needed to walk in hospital room?: A Lot Help needed climbing 3-5 steps with a railing? : A Lot 6 Click Score: 16    End of Session Equipment Utilized During Treatment: Gait belt Activity Tolerance: Patient tolerated treatment well Patient left: in chair;with call bell/phone within reach;with family/visitor present Nurse Communication: Mobility status PT Visit Diagnosis: Unsteadiness on feet (R26.81);Other abnormalities of gait and mobility (R26.89);Pain Pain - Right/Left: Right Pain - part of body: Leg     Time: 1610-9604 PT Time Calculation (min) (ACUTE ONLY): 50 min  Charges:    $Gait Training: 23-37 mins $Therapeutic Activity: 8-22 mins PT General Charges $$ ACUTE PT VISIT: 1 Visit                     Darcus Eastern, PT  Acute Rehabilitation Services Office [phone] Secure Chat welcomed    Marcial Setting 06/23/2023, 2:19 PM

## 2023-06-23 NOTE — Telephone Encounter (Signed)
 Roberta Soto from the hospital called and said that he just put in the order. She said that they only do just  with contrast for soft tissue. Can she just change it with your permission. CB#[phone]

## 2023-06-23 NOTE — Plan of Care (Signed)

## 2023-06-23 NOTE — Progress Notes (Signed)
 [facility]  Date of Admission:  06/19/2023      Total days of antibiotics 5  Ceftriaxone  5/08 >> c   Zosyn  x 1, vanc x 1  ASSESSMENT: Roberta Soto is a 43 y.o. female admitted with:   Right Knee Pain, Acute, Cellulitis -  S/P Prepatellar Bursa Debridement 5/9 -  Streptococcal Pyogenes - Secondary to traumatic inoculation from injury.  Joint aspiration was positive for rare streptococcus pyogenes on cultures. She did not have concomitant bacteremia. Intra-op cultures from bursectomy were also positive with same organism. Continue IV ceftriaxone  for now. We can probably narrow to PCN and consider linezolid vs high dose beta lactam after she has a short course of IV antibiotics.  Leukocytosis is improving quickly and her fevers have stopped.  We did not talk much about the duration today as she was upset over other medical care issues.  - continue IV ceftriaxone   - hopeful to consider PO treatment soon - CRP in AM  - Add linezolid for 3d to see if it helps with toxin inhibition with known GAS  Bacteremia, Viridans Strep 1/4 bottles -  Not from similar strep group, likely this is contaminant from venipuncture.   LV Dysfunction -  EF 30-35%. GDMT is being adjusted with cardiology following.  RLE doppler to R/O DVT   Urinary Incontinence -  Having trouble getting to bathroom in time and requesting purewick to be replaced. Ordered. D/W Dr. Lyndon Santiago    PLAN: Continue ceftriaxone  Add linezolid q12h for 3d to see if that helps swelling/toxin FU CT of leg and Dopplers.  PureWick ordered for incontinence   Principal Problem:   Abscess of bursa of right knee Active Problems:   Sepsis  AMS  Hemodynamic instability   Chronic health problem   AMS (altered mental status)   Asymptomatic LV dysfunction   Benign hypertension   Class 3 severe obesity due to excess calories with serious comorbidity and body mass index (BMI) of 40.0 to 44.9 in  adult    acetaminophen   650 mg Oral Q6H   Chlorhexidine Gluconate Cloth  6 each Topical Daily   clonazePAM  1 mg Oral TID   vitamin B-12  1,000 mcg Oral Daily   enoxaparin  (LOVENOX ) injection  60 mg Subcutaneous Q24H   escitalopram  10 mg Oral Daily   fluticasone furoate-vilanterol  1 puff Inhalation Daily   furosemide  20 mg Intravenous Daily   gabapentin  400 mg Oral TID   insulin  aspart  0-5 Units Subcutaneous QHS   insulin  aspart  0-9 Units Subcutaneous TID WC   [START ON 06/24/2023] losartan  25 mg Oral QPM   metoprolol succinate  12.5 mg Oral Daily   polyethylene glycol  17 g Oral Daily   senna-docusate  2 tablet Oral BID    SUBJECTIVE: Crying because of urinary incontinence. She is embarrassed.  She is on her menses at present also.  Still with fair amount of pain in the knee compared to pre op  Review of Systems: Review of Systems  Constitutional:  Negative for chills and fever.  Genitourinary:  Positive for frequency (incontinence).  Musculoskeletal:  Positive for joint pain.    Allergies  Allergen Reactions   Fruit & Vegetable Daily [Nutritional Supplements] Other (See Comments)    Unknown reaction   Latex Rash    OBJECTIVE: Vitals:   06/22/23 2100 06/23/23 0430 06/23/23 0813 06/23/23 1144  BP: 100/83 (!) 123/108 124/89  110/67  Pulse: (!) 111 (!) 116 (!) 112 (!) 108  Resp: 18 18 19 18   Temp: 98.9 F (37.2 C) 98.6 F (37 C) 98.2 F (36.8 C)   TempSrc: Oral Oral Oral   SpO2: 96% 96% 99% 98%  Weight:      Height:       Body mass index is 44.2 kg/m.  Physical Exam Vitals reviewed.  Constitutional:      Appearance: Normal appearance. She is not ill-appearing.  Cardiovascular:     Rate and Rhythm: Regular rhythm. Tachycardia present.  Abdominal:     General: There is no distension.     Tenderness: There is no abdominal tenderness.  Musculoskeletal:     Comments: Rt knee wrapped in surgical dressing. Sitting in chair with leg outstretched    Skin:    General: Skin is warm and dry.  Neurological:     Mental Status: She is alert and oriented to person, place, and time.     Lab Results Lab Results  Component Value Date   WBC 21.3 (H) 06/23/2023   HGB 7.9 (L) 06/23/2023   HCT 27.7 (L) 06/23/2023   MCV 68.2 (L) 06/23/2023   PLT 402 (H) 06/23/2023    Lab Results  Component Value Date   CREATININE 0.96 06/23/2023   BUN 9 06/23/2023   NA 139 06/23/2023   K 3.5 06/23/2023   CL 106 06/23/2023   CO2 23 06/23/2023    Lab Results  Component Value Date   ALT 40 06/20/2023   AST 36 06/20/2023   ALKPHOS 63 06/20/2023   BILITOT 1.2 06/20/2023     Microbiology: Recent Results (from the past 240 hours)  Blood Culture (routine x 2)     Status: None (Preliminary result)   Collection Time: 06/19/23 12:09 PM   Specimen: BLOOD LEFT ARM  Result Value Ref Range Status   Specimen Description BLOOD LEFT ARM  Final   Special Requests   Final    AEROBIC BOTTLE ONLY Blood Culture results may not be optimal due to an inadequate volume of blood received in culture bottles   Culture   Final    NO GROWTH 4 DAYS Performed at [facility], [address] [place], [place] [address]    Report Status PENDING  Incomplete  Resp panel by RT-PCR (RSV, Flu A&B, Covid) Anterior Nasal Swab     Status: None   Collection Time: 06/19/23 12:10 PM   Specimen: Anterior Nasal Swab  Result Value Ref Range Status   SARS Coronavirus 2 by RT PCR NEGATIVE NEGATIVE Final   Influenza A by PCR NEGATIVE NEGATIVE Final   Influenza B by PCR NEGATIVE NEGATIVE Final    Comment: (NOTE) The Xpert Xpress SARS-CoV-2/FLU/RSV plus assay is intended as an aid in the diagnosis of influenza from Nasopharyngeal swab specimens and should not be used as a sole basis for treatment. Nasal washings and aspirates are unacceptable for Xpert Xpress SARS-CoV-2/FLU/RSV testing.  Fact Sheet for Patients: BloggerCourse.com  Fact Sheet for  Healthcare Providers: SeriousBroker.it  This test is not yet approved or cleared by the [place]  FDA and has been authorized for detection and/or diagnosis of SARS-CoV-2 by FDA under an Emergency Use Authorization (EUA). This EUA will remain in effect (meaning this test can be used) for the duration of the COVID-19 declaration under Section 564(b)(1) of the Act, 21 U.S.C. section 360bbb-3(b)(1), unless the authorization is terminated or revoked.     Resp Syncytial Virus by PCR  NEGATIVE NEGATIVE Final    Comment: (NOTE) Fact Sheet for Patients: BloggerCourse.com  Fact Sheet for Healthcare Providers: SeriousBroker.it  This test is not yet approved or cleared by the [place]  FDA and has been authorized for detection and/or diagnosis of SARS-CoV-2 by FDA under an Emergency Use Authorization (EUA). This EUA will remain in effect (meaning this test can be used) for the duration of the COVID-19 declaration under Section 564(b)(1) of the Act, 21 U.S.C. section 360bbb-3(b)(1), unless the authorization is terminated or revoked.  Performed at [facility], [address], [place], [place] [address]   Blood Culture (routine x 2)     Status: Abnormal   Collection Time: 06/19/23 12:14 PM   Specimen: BLOOD LEFT ARM  Result Value Ref Range Status   Specimen Description BLOOD LEFT ARM  Final   Special Requests   Final    BOTTLES DRAWN AEROBIC AND ANAEROBIC Blood Culture results may not be optimal due to an inadequate volume of blood received in culture bottles   Culture  Setup Time   Final    GRAM POSITIVE COCCI ANAEROBIC BOTTLE ONLY CRITICAL RESULT CALLED TO, READ BACK BY AND VERIFIED WITH: J [facility]  06/20/23 MK Performed at [facility], [address] [place], [place] [address]    Culture VIRIDANS STREPTOCOCCUS (A)  Final   Report Status 06/22/2023 FINAL  Final   Organism ID,  Bacteria VIRIDANS STREPTOCOCCUS  Final      Susceptibility   Viridans streptococcus - MIC*    PENICILLIN 0.25 INTERMEDIATE Intermediate     CEFTRIAXONE  0.25 SENSITIVE Sensitive     ERYTHROMYCIN <=0.12 SENSITIVE Sensitive     LEVOFLOXACIN 1 SENSITIVE Sensitive     VANCOMYCIN  0.5 SENSITIVE Sensitive     * VIRIDANS STREPTOCOCCUS  Blood Culture ID Panel (Reflexed)     Status: Abnormal   Collection Time: 06/19/23 12:14 PM  Result Value Ref Range Status   Enterococcus faecalis NOT DETECTED NOT DETECTED Final   Enterococcus Faecium NOT DETECTED NOT DETECTED Final   Listeria monocytogenes NOT DETECTED NOT DETECTED Final   Staphylococcus species NOT DETECTED NOT DETECTED Final   Staphylococcus aureus (BCID) NOT DETECTED NOT DETECTED Final   Staphylococcus epidermidis NOT DETECTED NOT DETECTED Final   Staphylococcus lugdunensis NOT DETECTED NOT DETECTED Final   Streptococcus species DETECTED (A) NOT DETECTED Final    Comment: Not Enterococcus species, Streptococcus agalactiae, Streptococcus pyogenes, or Streptococcus pneumoniae. CRITICAL RESULT CALLED TO, READ BACK BY AND VERIFIED WITH: J WYLAND,[email or web address]  06/20/23 MK    Streptococcus agalactiae NOT DETECTED NOT DETECTED Final   Streptococcus pneumoniae NOT DETECTED NOT DETECTED Final   Streptococcus pyogenes NOT DETECTED NOT DETECTED Final   A.calcoaceticus-baumannii NOT DETECTED NOT DETECTED Final   Bacteroides fragilis NOT DETECTED NOT DETECTED Final   Enterobacterales NOT DETECTED NOT DETECTED Final   Enterobacter cloacae complex NOT DETECTED NOT DETECTED Final   Escherichia coli NOT DETECTED NOT DETECTED Final   Klebsiella aerogenes NOT DETECTED NOT DETECTED Final   Klebsiella oxytoca NOT DETECTED NOT DETECTED Final   Klebsiella pneumoniae NOT DETECTED NOT DETECTED Final   Proteus species NOT DETECTED NOT DETECTED Final   Salmonella species NOT DETECTED NOT DETECTED Final   Serratia marcescens NOT DETECTED NOT DETECTED Final    Haemophilus influenzae NOT DETECTED NOT DETECTED Final   Neisseria meningitidis NOT DETECTED NOT DETECTED Final   Pseudomonas aeruginosa NOT DETECTED NOT DETECTED Final   Stenotrophomonas maltophilia NOT DETECTED NOT DETECTED Final   Candida albicans NOT  DETECTED NOT DETECTED Final   Candida auris NOT DETECTED NOT DETECTED Final   Candida glabrata NOT DETECTED NOT DETECTED Final   Candida krusei NOT DETECTED NOT DETECTED Final   Candida parapsilosis NOT DETECTED NOT DETECTED Final   Candida tropicalis NOT DETECTED NOT DETECTED Final   Cryptococcus neoformans/gattii NOT DETECTED NOT DETECTED Final    Comment: Performed at [facility], [address] [place], [place] [address]  Body fluid culture w Gram Stain     Status: None   Collection Time: 06/19/23  1:02 PM   Specimen: Body Fluid  Result Value Ref Range Status   Specimen Description FLUID  Final   Special Requests RIGHT KNEE  Final   Gram Stain   Final    RARE WBC PRESENT,BOTH PMN AND MONONUCLEAR NO ORGANISMS SEEN    Culture   Final    RARE STREPTOCOCCUS PYOGENES Beta hemolytic streptococci are predictably susceptible to penicillin and other beta lactams. Susceptibility testing not routinely performed. Performed at [facility], [address], [place], [place] [address]    Report Status 06/21/2023 FINAL  Final  MRSA Next Gen by PCR, Nasal     Status: None   Collection Time: 06/19/23  5:56 PM   Specimen: Nasal Mucosa; Nasal Swab  Result Value Ref Range Status   MRSA by PCR Next Gen NOT DETECTED NOT DETECTED Final    Comment: (NOTE) The GeneXpert MRSA Assay (FDA approved for NASAL specimens only), is one component of a comprehensive MRSA colonization surveillance program. It is not intended to diagnose MRSA infection nor to guide or monitor treatment for MRSA infections. Test performance is not FDA approved in patients less than 20 years old. Performed at [facility], [address], [place],  [place] [address]   Aerobic/Anaerobic Culture w Gram Stain (surgical/deep wound)     Status: None (Preliminary result)   Collection Time: 06/20/23  1:29 PM   Specimen: Wound  Result Value Ref Range Status   Specimen Description WOUND  Final   Special Requests RIGHT PREPATELLAR BURSA  Final   Gram Stain   Final    NO WBC SEEN GRAM POSITIVE COCCI IN PAIRS Performed at [facility], [address], [place], [place] [address]    Culture   Final    RARE GROUP A STREP (S.PYOGENES) ISOLATED Beta hemolytic streptococci are predictably susceptible to penicillin and other beta lactams. Susceptibility testing not routinely performed. NO ANAEROBES ISOLATED; CULTURE IN PROGRESS FOR 5 DAYS    Report Status PENDING  Incomplete  Aerobic/Anaerobic Culture w Gram Stain (surgical/deep wound)     Status: None (Preliminary result)   Collection Time: 06/20/23  1:34 PM   Specimen: Wound; Body Fluid  Result Value Ref Range Status   Specimen Description WOUND  Final   Special Requests RIGHT KNEE ASPIRATION  Final   Gram Stain NO WBC SEEN NO ORGANISMS SEEN   Final   Culture   Final    NO GROWTH 3 DAYS NO ANAEROBES ISOLATED; CULTURE IN PROGRESS FOR 5 DAYS Performed at [facility], [address] [place], [place] [address]    Report Status PENDING  Incomplete  Culture, blood (Routine X 2) w Reflex to ID Panel     Status: None (Preliminary result)   Collection Time: 06/21/23 10:22 AM   Specimen: BLOOD LEFT HAND  Result Value Ref Range Status   Specimen Description BLOOD LEFT HAND  Final   Special Requests  Final    BOTTLES DRAWN AEROBIC AND ANAEROBIC Blood Culture results may not be optimal due to an inadequate volume of blood received in culture bottles   Culture   Final    NO GROWTH 2 DAYS Performed at [facility], [address] [place], [place] [address]    Report Status PENDING  Incomplete  Culture, blood (Routine X 2) w Reflex to ID Panel     Status: None (Preliminary result)    Collection Time: 06/21/23 10:28 AM   Specimen: BLOOD RIGHT HAND  Result Value Ref Range Status   Specimen Description BLOOD RIGHT HAND  Final   Special Requests   Final    BOTTLES DRAWN AEROBIC AND ANAEROBIC Blood Culture results may not be optimal due to an inadequate volume of blood received in culture bottles   Culture   Final    NO GROWTH 2 DAYS Performed at [facility], [address], [place], [place] [address]    Report Status PENDING  Incomplete     Gibson Kurtz, MSN, NP-C [facility]  [email or web address] Pager: [phone] Office: [phone] [facility] Main Line: [phone] *Secure Chat Communication Welcome

## 2023-06-23 NOTE — Progress Notes (Signed)
 Rounding Note    Patient Name: Roberta Soto Date of Encounter: 06/23/2023  [facility] Cardiologist: None  Chief Complaint:  Chief Complaint  Patient presents with   Altered Mental Status  Reason of consult: New onset LV dysfunction   Subjective   Resting in bed. Denies anginal chest pain. Shortness of breath consistent with her underlying diagnosis of asthma  Inpatient Medications    Scheduled Meds:  acetaminophen   650 mg Oral Q6H   Chlorhexidine Gluconate Cloth  6 each Topical Daily   clonazePAM  1 mg Oral TID   vitamin B-12  1,000 mcg Oral Daily   enoxaparin  (LOVENOX ) injection  50 mg Subcutaneous Q24H   escitalopram  10 mg Oral Daily   fluticasone furoate-vilanterol  1 puff Inhalation Daily   furosemide  20 mg Intravenous Daily   gabapentin  400 mg Oral TID   insulin  aspart  0-5 Units Subcutaneous QHS   insulin  aspart  0-9 Units Subcutaneous TID WC   losartan  25 mg Oral Daily   metoprolol succinate  12.5 mg Oral Daily   Continuous Infusions:  cefTRIAXone  (ROCEPHIN )  IV 2 g (06/23/23 0603)   PRN Meds: cyclobenzaprine, docusate sodium , HYDROmorphone  (DILAUDID ) injection, ipratropium-albuterol , oxyCODONE, polyethylene glycol   Vital Signs    Vitals:   06/22/23 1632 06/22/23 2100 06/23/23 0430 06/23/23 0813  BP: 113/82 100/83 (!) 123/108 124/89  Pulse: (!) 110 (!) 111 (!) 116 (!) 112  Resp: 19 18 18 19   Temp: 99 F (37.2 C) 98.9 F (37.2 C) 98.6 F (37 C) 98.2 F (36.8 C)  TempSrc: Oral Oral Oral Oral  SpO2: 96% 96% 96% 99%  Weight:      Height:        Intake/Output Summary (Last 24 hours) at 06/23/2023 0947 Last data filed at 06/23/2023 4098 Gross per 24 hour  Intake --  Output 1000 ml  Net -1000 ml      06/21/2023    5:00 AM 06/20/2023    5:00 AM 06/19/2023   12:06 PM  Last 3 Weights  Weight (lbs) 282 lb 3 oz 297 lb 2.9 oz 253 lb 8.5 oz  Weight (kg) 128 kg 134.8 kg 115 kg      Telemetry    Not on telemetry- Personally  Reviewed  ECG     No new tracing - Personally Reviewed  Physical Exam   Physical Exam  Constitutional: No distress.  hemodynamically stable  Neck: No JVD present.  Cardiovascular: Normal rate, regular rhythm, S1 normal and S2 normal. Exam reveals no gallop, no S3 and no S4.  No murmur heard. Pulmonary/Chest: Effort normal and breath sounds normal. No stridor. She has no wheezes. She has no rales.  Musculoskeletal:        General: Edema (Right lower extremity swollen 2+ up to the thighs, Ace wraps present) present.     Cervical back: Neck supple.  Neurological: She is alert and oriented to person, place, and time.  Skin: Skin is warm and moist.  Tattoos noted    Labs    High Sensitivity Troponin:  No results for input(s): "TROPONINIHS" in the last 720 hours.   Chemistry Recent Labs  Lab  0000 06/19/23 1348 06/19/23 1349 06/19/23 1402 06/20/23 0230 06/22/23 1031 06/23/23 0508  NA  --   --  142   < > 141 138 139  K  --   --  3.3*   < > 3.6 3.2* 3.5  CL   < >  --  107  --  108 104 106  CO2   < >  --  24  --  20* 23 23  GLUCOSE   < >  --  131*  --  94 124* 94  BUN   < >  --  14  --  14 11 9   CREATININE  --   --  1.51*   < > 1.53* 1.16* 0.96  CALCIUM    < >  --  8.1*  --  7.7* 8.1* 8.2*  MG  --  1.8  --   --  1.5*  --  1.9  PROT  --   --  6.1*  --  5.4*  --   --   ALBUMIN  --   --  3.4*  --  2.8*  --   --   AST  --   --  47*  --  36  --   --   ALT  --   --  48*  --  40  --   --   ALKPHOS  --   --  70  --  63  --   --   BILITOT  --   --  1.1  --  1.2  --   --   GFRNONAA  --   --  44*   < > 44* >60 >60  ANIONGAP   < >  --  11  --  13 11 10    < > = values in this interval not displayed.    Lipids No results for input(s): "CHOL", "TRIG", "HDL", "LABVLDL", "LDLCALC", "CHOLHDL" in the last 168 hours.  Hematology Recent Labs  Lab 06/20/23 0230 06/22/23 1031 06/23/23 0508  WBC 41.7* 31.7* 21.3*  RBC 4.59 4.08 4.06  HGB 8.8* 7.9* 7.9*  HCT 31.9* 28.0* 27.7*  MCV  69.5* 68.6* 68.2*  MCH 19.2* 19.4* 19.5*  MCHC 27.6* 28.2* 28.5*  RDW 18.4* 18.5* 18.6*  PLT 364 386 402*   Thyroid No results for input(s): "TSH", "FREET4" in the last 168 hours.  BNPNo results for input(s): "BNP", "PROBNP" in the last 168 hours.  DDimer No results for input(s): "DDIMER" in the last 168 hours.   Radiology    NA  Cardiac Studies   Echo  06/20/2023  1. Left ventricular ejection fraction, by estimation, is 30 to 35%. The  left ventricle has moderately decreased function. The left ventricle  demonstrates global hypokinesis. The left ventricular internal cavity size  was moderately dilated. Left  ventricular diastolic parameters were normal.   2. Right ventricular systolic function is normal. The right ventricular  size is normal. There is normal pulmonary artery systolic pressure.   3. Left atrial size was moderately dilated.   4. The mitral valve is abnormal. Mild mitral valve regurgitation. No  evidence of mitral stenosis.   5. The aortic valve is tricuspid. Aortic valve regurgitation is not  visualized. No aortic stenosis is present.   6. The inferior vena cava is normal in size with greater than 50%  respiratory variability, suggesting right atrial pressure of 3 mmHg.   Patient Profile     44 y.o. female with PMH significant for asthma and HTN who presented to the ED with confusion and R knee pain.  Patient was noted to be quite agitated and tachycardic.  She was initially admitted to the ICU.  There was concern for right knee infection. Blood culture from 5/8 is growing strep viridans. Strep pyogenes also noted in right knee  aspirate as well as aspirate from the prepatellar bursa. Echo performed which noted reduced LVEF and cardiology consulted.   Assessment & Plan    LV Dysfunction:  EF noted to be 30 to 35%.  Likely due to severe infection / sepsis  Repeat echo as outpatient in 8-12 weeks once infection improves to re-evaluate LVEF.  Etiologies include  but not limited to stress-induced cardiomyopathy, hypertensive heart disease, etc. She is volume overloaded on physical examination component likely secondary to recent right knee interventions/third spacing Would favor GDMT as tolerated. Start Lasix 20 mg IV push daily Start losartan 25 mg p.o. daily.  Patient states that she has had tubal ligation therefore no obvious concerns for teratogenic complications. Start Toprol-XL 12.5 mg p.o. daily Uptitrated as tolerated Strict I's and O's and daily weights. If LVEF does not recover would benefit from ischemic evaluation given the degree of cardiomyopathy. Check A1c and fasting lipids for risk assessment.   Strep pyogenes bacteremia/right leg cellulitis/right knee bursitis/sepsis present on admission  Currently on antibiotics  ID following.   HTN: medications as discuss above.    AKI resolved.   Obesity due to excess calorie:  Body mass index is 44.2 kg/m. I reviewed with her importance of diet, regular physical activity/exercise, weight loss.   Patient is educated on the importance of increasing physical activity gradually as tolerated with a goal of moderate intensity exercise for 30 minutes a day 5 days a week.  Remainder of evaluation and management per primary team.  For questions or updates, please contact [facility] Please consult [email or web address] for contact info under     Signed, Olinda Bertrand, DO, [facility]   [facility]  Pager: [phone] Office: [phone] 06/23/2023, 9:47 AM

## 2023-06-23 NOTE — TOC Progression Note (Addendum)
 Transition of Care [facility]) - Progression Note    Patient Details  Name: Roberta Soto MRN: [id number] Date of Birth: 1981-01-09  Transition of Care [facility]) CM/SW Contact  Jannine Meo, RN Phone Number: 06/23/2023, 1:14 PM  Clinical Narrative:  Patient contnues to have elevated WBC.Awaiting ID decision of treatment. PT recommeded HHPT, unfortunately pt does not have payor source for HHPT. Spoke with patient, she is currently living in a hotel. Her address is [address], [place], [place] [address]. Patient confirmed has transportation to outpatient physical therapy facility once discharged. Awaiting ID recommendations on antibiotic route choice at discharge. DME RW and 3:1 recommendations noted, will need to order closer to discharge.         Expected Discharge Plan and Services                                               Social Determinants of Health (SDOH) Interventions SDOH Screenings   Tobacco Use: Medium Risk (06/20/2023)    Readmission Risk Interventions     No data to display

## 2023-06-23 NOTE — Discharge Instructions (Signed)
 WBAT RLE Continue with wet-to-dry dressings twice daily Take antibiotics as instructed

## 2023-06-23 NOTE — Progress Notes (Signed)
 Subjective: 3 Days Post-Op Procedure(s) (LRB): IRRIGATION AND DEBRIDEMENT RIGHT KNEE (Right) Patient reports pain as moderate.    Objective: Vital signs in last 24 hours: Temp:  [98.2 F (36.8 C)-99 F (37.2 C)] 98.2 F (36.8 C) (05/12 0813) Pulse Rate:  [108-116] 108 (05/12 1144) Resp:  [18-19] 18 (05/12 1144) BP: (100-124)/(67-108) 110/67 (05/12 1144) SpO2:  [96 %-99 %] 98 % (05/12 1144)  Intake/Output from previous day: 05/11 0701 - 05/12 0700 In: -  Out: 1000 [Urine:1000] Intake/Output this shift: No intake/output data recorded.  Recent Labs    06/22/23 1031 06/23/23 0508  HGB 7.9* 7.9*   Recent Labs    06/22/23 1031 06/23/23 0508  WBC 31.7* 21.3*  RBC 4.08 4.06  HCT 28.0* 27.7*  PLT 386 402*   Recent Labs    06/22/23 1031 06/23/23 0508  NA 138 139  K 3.2* 3.5  CL 104 106  CO2 23 23  BUN 11 9  CREATININE 1.16* 0.96  GLUCOSE 124* 94  CALCIUM  8.1* 8.2*   No results for input(s): "LABPT", "INR" in the last 72 hours.  Neurovascular intact Sensation intact distally Intact pulses distally Dorsiflexion/Plantar flexion intact No cellulitis present Compartment soft Moderate swelling to the RLE.  Right lateral thigh warm and erythematous.  Some induration noted as well. TTP to calf.  Negative homans    Assessment/Plan: 3 Days Post-Op Procedure(s) (LRB): IRRIGATION AND DEBRIDEMENT RIGHT KNEE (Right) Up with therapy WBAT RLE Iodoform packing removed by me today.  Dry dressing applied Patient will need wet-to-dry dressing changes twice daily.  Order placed Intra-op cs growing strep pyogenes.  Continue abx per ID Will order U/S RLE r/o dvt    Sandie Cross 06/23/2023, 2:06 PM

## 2023-06-24 ENCOUNTER — Inpatient Hospital Stay (HOSPITAL_COMMUNITY)

## 2023-06-24 DIAGNOSIS — A400 Sepsis due to streptococcus, group A: Principal | ICD-10-CM

## 2023-06-24 DIAGNOSIS — A419 Sepsis, unspecified organism: Secondary | ICD-10-CM | POA: Diagnosis not present

## 2023-06-24 DIAGNOSIS — E66813 Obesity, class 3: Secondary | ICD-10-CM | POA: Diagnosis not present

## 2023-06-24 DIAGNOSIS — M79661 Pain in right lower leg: Secondary | ICD-10-CM | POA: Diagnosis not present

## 2023-06-24 DIAGNOSIS — I10 Essential (primary) hypertension: Secondary | ICD-10-CM | POA: Diagnosis not present

## 2023-06-24 DIAGNOSIS — M71061 Abscess of bursa, right knee: Secondary | ICD-10-CM | POA: Diagnosis not present

## 2023-06-24 DIAGNOSIS — I519 Heart disease, unspecified: Secondary | ICD-10-CM | POA: Diagnosis not present

## 2023-06-24 DIAGNOSIS — R7881 Bacteremia: Secondary | ICD-10-CM | POA: Diagnosis not present

## 2023-06-24 LAB — CBC
HCT: 28.8 % — ABNORMAL LOW (ref 36.0–46.0)
Hemoglobin: 8 g/dL — ABNORMAL LOW (ref 12.0–15.0)
MCH: 19 pg — ABNORMAL LOW (ref 26.0–34.0)
MCHC: 27.8 g/dL — ABNORMAL LOW (ref 30.0–36.0)
MCV: 68.4 fL — ABNORMAL LOW (ref 80.0–100.0)
Platelets: 458 10*3/uL — ABNORMAL HIGH (ref 150–400)
RBC: 4.21 MIL/uL (ref 3.87–5.11)
RDW: 18.7 % — ABNORMAL HIGH (ref 11.5–15.5)
WBC: 18.2 10*3/uL — ABNORMAL HIGH (ref 4.0–10.5)
nRBC: 0.2 % (ref 0.0–0.2)

## 2023-06-24 LAB — LIPID PANEL
Cholesterol: 119 mg/dL (ref 0–200)
HDL: 18 mg/dL — ABNORMAL LOW (ref >40)
LDL Cholesterol: 76 mg/dL (ref 0–99)
Total CHOL/HDL Ratio: 6.6 ratio
Triglycerides: 125 mg/dL (ref <150)
VLDL: 25 mg/dL (ref 0–40)

## 2023-06-24 LAB — GLUCOSE, CAPILLARY
Glucose-Capillary: 79 mg/dL (ref 70–99)
Glucose-Capillary: 123 mg/dL — ABNORMAL HIGH (ref 70–99)
Glucose-Capillary: 74 mg/dL (ref 70–99)
Glucose-Capillary: 140 mg/dL — ABNORMAL HIGH (ref 70–99)

## 2023-06-24 LAB — BASIC METABOLIC PANEL WITH GFR
Anion gap: 8 (ref 5–15)
BUN: 8 mg/dL (ref 6–20)
CO2: 23 mmol/L (ref 22–32)
Calcium: 8.3 mg/dL — ABNORMAL LOW (ref 8.9–10.3)
Chloride: 106 mmol/L (ref 98–111)
Creatinine, Ser: 0.94 mg/dL (ref 0.44–1.00)
GFR, Estimated: >60 mL/min (ref >60)
Glucose, Bld: 89 mg/dL (ref 70–99)
Potassium: 3.7 mmol/L (ref 3.5–5.1)
Sodium: 137 mmol/L (ref 135–145)

## 2023-06-24 LAB — CULTURE, BLOOD (ROUTINE X 2): Culture: NO GROWTH

## 2023-06-24 LAB — C-REACTIVE PROTEIN: CRP: 15.8 mg/dL — ABNORMAL HIGH (ref <1.0)

## 2023-06-24 MED ORDER — METOPROLOL SUCCINATE ER 25 MG PO TB24
25.0000 mg | ORAL_TABLET | Freq: Every day | ORAL | Status: DC
  Administered 2023-06-25 – 2023-06-29 (×5): 25 mg via ORAL
  Filled 2023-06-24 (×5): qty 1

## 2023-06-24 MED ORDER — CARMEX CLASSIC LIP BALM EX OINT
TOPICAL_OINTMENT | CUTANEOUS | Status: DC | PRN
  Filled 2023-06-24: qty 10

## 2023-06-24 MED ORDER — SODIUM CHLORIDE 0.9% FLUSH: 10.0000 mL | INTRAVENOUS | Status: DC | PRN

## 2023-06-24 MED ORDER — SODIUM CHLORIDE 0.9% FLUSH
10.0000 mL | Freq: Two times a day (BID) | INTRAVENOUS | Status: DC
  Administered 2023-06-24 – 2023-06-29 (×10): 10 mL

## 2023-06-24 MED ORDER — IOHEXOL 350 MG/ML SOLN
100.0000 mL | Freq: Once | INTRAVENOUS | Status: AC | PRN
  Administered 2023-06-24: 100 mL via INTRAVENOUS

## 2023-06-24 NOTE — Progress Notes (Signed)
   Heart Failure Stewardship Pharmacist Progress Note   PCP: Patient, No Pcp Per PCP-Cardiologist: Sunit Tolia, DO    HPI:  43 yo F with PMH of asthma and HTN.  Presented to the ED on 5/8 with AMS and knee pain from a fall 1 month prior. She was febrile, lactic acid >2, tachycardic, and R knee looked infectious appearing. Admitted for sepsis due to cellulitis vs septic arthritis. Ortho consulted and taken to the OR on 5/9 for exploration. Blood cultures positive for strep pyogenes. ECHO 5/9 showed LVEF 30-35%, global hypokinesis, RV normal, mild MR, no vegetations. Cardiology was consulted. LV dysfunction thought to be more related to sepsis. Started on IV lasix (volume overloaded due to R knee interventions + third spacing) and GDMT with metoprolol and losartan.    Current HF Medications: Diuretic: furosemide 20 mg IV daily Beta Blocker: metoprolol XL 25 mg daily ACE/ARB/ARNI: losartan 25 mg nightly  Prior to admission HF Medications: None  Pertinent Lab Values: Serum creatinine 0.94, BUN 8, Potassium 3.7, Sodium 137, Magnesium  1.9, A1c 5.5   Vital Signs: Weight: 292 lbs (admission weight: 297 lbs) Blood pressure: 110/70s  Heart rate: 100s  I/O: incomplete  Medication Assistance / Insurance Benefits Check: Does the patient have prescription insurance?  Yes Type of insurance plan: [place] Medicaid  Outpatient Pharmacy:  Prior to admission outpatient pharmacy: [facility] Is the patient willing to use [facility] TOC pharmacy at discharge? Yes Is the patient willing to transition their outpatient pharmacy to utilize a [facility] outpatient pharmacy?   Pending    Assessment: 1. 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 NYHA class II symptoms. - Continue furosemide 20 mg IV daily. Strict I/Os and daily weights. Keep K>4 and Mg>2. - Agree with increasing to metoprolol XL 25 mg daily, remains tachycardic but this may be moreso due to pain/infection - Agree with  starting losartan 25 mg daily - Consider adding spironolactone tomorrow if BP stable   Plan: 1) Medication changes recommended at this time: - Agree with changes - Add spironolactone 12.5 mg daily tomorrow  2) Patient assistance: - None pending  3)  Education  - Initial education completed - Full education to be completed prior to discharge   Jerilyn Monte, PharmD, BCPS Heart Failure Engineer, building services Phone [phone]

## 2023-06-24 NOTE — Telephone Encounter (Signed)
 Done

## 2023-06-24 NOTE — Progress Notes (Signed)
   Subjective:  Patient reports pain as better.  She is a lot more alert and talkative.  CT femur and U/S performed earlier today.  Objective:   VITALS:   Vitals:   06/24/23 0738 06/24/23 1406 06/24/23 1804 06/24/23 1937  BP: 114/77 116/77 114/78 109/76  Pulse: (!) 103 (!) 103  (!) 106  Resp:    18  Temp: 98 F (36.7 C) 98.1 F (36.7 C)  99.2 F (37.3 C)  TempSrc: Oral Oral  Oral  SpO2: 100% 100%  99%  Weight:      Height:        Diffuse swelling and erythema of the thigh, calf and ankle. She can tolerate my touching and palpation much better.  I don't feel any focal fluid collections.  She is able to move her knee and ankle without any significant pain or difficulty.  Surgical site is open and able to drain adequately.  I cannot express any drainage by pushing down on the bursa.   Lab Results  Component Value Date   WBC 18.2 (H) 06/24/2023   HGB 8.0 (L) 06/24/2023   HCT 28.8 (L) 06/24/2023   MCV 68.4 (L) 06/24/2023   PLT 458 (H) 06/24/2023     Assessment/Plan:  4 Days Post-Op   CT femur does not show any abscesses.  Fluid collection in the prepatellar bursa is consistent with post surgical seroma or fluid as this area was aggressively debrided and irrigated. Doubt septic arthritis of the knee based on clinical presentation and that she has had 2 aspirations from the joint both with WBCs <100 and low neutrophils %.  Knee aspiration from 5/8 shows rare strep but this was performed by EDP directly throug cellulitis and septic bursitis.  Therefore, this is likely a contaminant.  The second aspiration performed in the OR has been negative for septic arthritis. CT ankle pending  Claria Crofts 06/24/2023, 7:47 PM

## 2023-06-24 NOTE — Plan of Care (Signed)

## 2023-06-24 NOTE — Telephone Encounter (Signed)
 yes

## 2023-06-24 NOTE — Progress Notes (Signed)
 Rounding Note    Patient Name: Roberta Soto Date of Encounter: 06/25/2023  [facility] Cardiologist: Olinda Bertrand, DO  Chief Complaint:  Chief Complaint  Patient presents with   Altered Mental Status  Reason of consult: New onset LV dysfunction   Subjective   More awake and alert. Denies anginal chest pain. Denies heart failure symptoms. Patient states that she has had a long history of hypertension-does not check her blood pressures at home.  Only takes hydrochlorothiazide at home. No family history of premature coronary artery disease, congestive heart failure or sudden death   Inpatient Medications    Scheduled Meds:  acetaminophen   650 mg Oral Q6H   Chlorhexidine Gluconate Cloth  6 each Topical Daily   clonazePAM  0.5 mg Oral TID   vitamin B-12  1,000 mcg Oral Daily   enoxaparin  (LOVENOX ) injection  60 mg Subcutaneous Q24H   escitalopram  10 mg Oral Daily   fluticasone furoate-vilanterol  1 puff Inhalation Daily   furosemide  20 mg Intravenous Daily   gabapentin  400 mg Oral TID   insulin  aspart  0-5 Units Subcutaneous QHS   insulin  aspart  0-9 Units Subcutaneous TID WC   linezolid  600 mg Oral Q12H   losartan  25 mg Oral QPM   metoprolol succinate  25 mg Oral Daily   polyethylene glycol  17 g Oral Daily   senna-docusate  2 tablet Oral BID   sodium chloride  flush  10-40 mL Intracatheter Q12H   Continuous Infusions:  cefTRIAXone  (ROCEPHIN )  IV 2 g (06/24/23 0554)   PRN Meds: cyclobenzaprine, HYDROmorphone  (DILAUDID ) injection, ipratropium-albuterol , lip balm, oxyCODONE, sodium chloride  flush   Vital Signs    Vitals:   06/24/23 0738 06/24/23 1406 06/24/23 1804 06/24/23 1937  BP: 114/77 116/77 114/78 109/76  Pulse: (!) 103 (!) 103  (!) 106  Resp:    18  Temp: 98 F (36.7 C) 98.1 F (36.7 C)  99.2 F (37.3 C)  TempSrc: Oral Oral  Oral  SpO2: 100% 100%  99%  Weight:      Height:        Intake/Output Summary (Last 24 hours) at 06/25/2023  0039 Last data filed at 06/24/2023 2011 Gross per 24 hour  Intake 357 ml  Output 300 ml  Net 57 ml       06/23/2023    7:14 AM 06/21/2023    5:00 AM 06/20/2023    5:00 AM  Last 3 Weights  Weight (lbs) 292 lb 8.8 oz 282 lb 3 oz 297 lb 2.9 oz  Weight (kg) 132.7 kg 128 kg 134.8 kg     Telemetry    Not on telemetry- Personally Reviewed  ECG     No new tracing - Personally Reviewed  Physical Exam   Physical Exam  Constitutional: No distress.  hemodynamically stable  Neck: No JVD present.  Cardiovascular: Normal rate, regular rhythm, S1 normal and S2 normal. Exam reveals no gallop, no S3 and no S4.  No murmur heard. Pulmonary/Chest: Effort normal and breath sounds normal. No stridor. She has no wheezes. She has no rales.  Musculoskeletal:        General: Edema (Right lower extremity swollen 2+ up to the thighs, Ace wraps present) present.     Cervical back: Neck supple.  Neurological: She is alert and oriented to person, place, and time.  Skin: Skin is warm and moist.  Tattoos noted    Labs    High Sensitivity Troponin:  No  results for input(s): "TROPONINIHS" in the last 720 hours.   Chemistry Recent Labs  Lab  0000 06/19/23 1348 06/19/23 1349 06/19/23 1402 06/20/23 0230 06/22/23 1031 06/23/23 0508 06/24/23 0658  NA  --   --  142   < > 141 138 139 137  K  --   --  3.3*   < > 3.6 3.2* 3.5 3.7  CL   < >  --  107  --  108 104 106 106  CO2   < >  --  24  --  20* 23 23 23   GLUCOSE   < >  --  131*  --  94 124* 94 89  BUN   < >  --  14  --  14 11 9 8   CREATININE  --   --  1.51*   < > 1.53* 1.16* 0.96 0.94  CALCIUM    < >  --  8.1*  --  7.7* 8.1* 8.2* 8.3*  MG  --  1.8  --   --  1.5*  --  1.9  --   PROT  --   --  6.1*  --  5.4*  --   --   --   ALBUMIN  --   --  3.4*  --  2.8*  --   --   --   AST  --   --  47*  --  36  --   --   --   ALT  --   --  48*  --  40  --   --   --   ALKPHOS  --   --  70  --  63  --   --   --   BILITOT  --   --  1.1  --  1.2  --   --   --    GFRNONAA  --   --  44*   < > 44* >60 >60 >60  ANIONGAP   < >  --  11  --  13 11 10 8    < > = values in this interval not displayed.    Lipids  Recent Labs  Lab 06/24/23 0658  CHOL 119  TRIG 125  HDL 18*  LDLCALC 76  CHOLHDL 6.6    Hematology Recent Labs  Lab 06/22/23 1031 06/23/23 0508 06/24/23 0658  WBC 31.7* 21.3* 18.2*  RBC 4.08 4.06 4.21  HGB 7.9* 7.9* 8.0*  HCT 28.0* 27.7* 28.8*  MCV 68.6* 68.2* 68.4*  MCH 19.4* 19.5* 19.0*  MCHC 28.2* 28.5* 27.8*  RDW 18.5* 18.6* 18.7*  PLT 386 402* 458*   Thyroid No results for input(s): "TSH", "FREET4" in the last 168 hours.  BNPNo results for input(s): "BNP", "PROBNP" in the last 168 hours.  DDimer No results for input(s): "DDIMER" in the last 168 hours.   Radiology    NA  Cardiac Studies   Echo  06/20/2023  1. Left ventricular ejection fraction, by estimation, is 30 to 35%. The  left ventricle has moderately decreased function. The left ventricle  demonstrates global hypokinesis. The left ventricular internal cavity size  was moderately dilated. Left  ventricular diastolic parameters were normal.   2. Right ventricular systolic function is normal. The right ventricular  size is normal. There is normal pulmonary artery systolic pressure.   3. Left atrial size was moderately dilated.   4. The mitral valve is abnormal. Mild mitral valve regurgitation. No  evidence of mitral stenosis.  5. The aortic valve is tricuspid. Aortic valve regurgitation is not  visualized. No aortic stenosis is present.   6. The inferior vena cava is normal in size with greater than 50%  respiratory variability, suggesting right atrial pressure of 3 mmHg.   Patient Profile     43 y.o. female with PMH significant for asthma and HTN who presented to the ED with confusion and R knee pain.  Patient was noted to be quite agitated and tachycardic.  She was initially admitted to the ICU.  There was concern for right knee infection. Blood culture  from 5/8 is growing strep viridans. Strep pyogenes also noted in right knee aspirate as well as aspirate from the prepatellar bursa. Echo performed which noted reduced LVEF and cardiology consulted.   Assessment & Plan    LV Dysfunction:  EF noted to be 30 to 35%.  Repeat echo as outpatient in 8-12 weeks once infection improves to re-evaluate EF.  Etiologies include but not limited to stress-induced cardiomyopathy (severe infection/sepsis), hypertensive heart disease, etc. Would favor GDMT as tolerated. Swelling and volume size improved with diuretic therapy. Continue Lasix 20 mg IV push daily Continue losartan 25 mg p.o. daily.  Patient states that she has had tubal ligation therefore no obvious concerns for teratogenic complications. Has had urinary tract infections in the past, will hold off on SGLT2 inhibitors at this time. Tolerated Toprol-XL 12.5 mg p.o. daily will increase it to 25 mg po qday.  Uptitrated as tolerated Strict I's and O's and daily weights. If LVEF does not recover would benefit from ischemic evaluation given the degree of cardiomyopathy. A1c and fasting lipids reviewed No strict I&O - will place order again.   Right Leg Swelling : Duplex results pending.   Strep pyogenes bacteremia/right leg cellulitis/right knee bursitis/sepsis present on admission  Currently on antibiotics  ID following.   HTN: medications as discuss above.    AKI resolved.   Obesity due to excess calorie:  Body mass index is 45.82 kg/m. I reviewed with her importance of diet, regular physical activity/exercise, weight loss.   Patient is educated on the importance of increasing physical activity gradually as tolerated with a goal of moderate intensity exercise for 30 minutes a day 5 days a week.  Remainder of evaluation and management per primary team.  For questions or updates, please contact [facility] Please consult [email or web address] for contact info under     Signed, Olinda Bertrand, DO, [facility]  [facility]  Pager: [phone] Office: [phone] 06/25/2023,

## 2023-06-24 NOTE — Progress Notes (Signed)
 PT Cancellation Note  Patient Details Name: Roberta Soto MRN: [id number] DOB: 1980-10-01   Cancelled Treatment:    Reason Eval/Treat Not Completed: At procedure or test/unavailable;  Not in room;   Will follow up later today as time allows;  Otherwise, will follow up for PT tomorrow;   Thank you,  Darcus Eastern, PT  Acute Rehabilitation Services Office [phone]    Marcial Setting 06/24/2023, 8:39 AM

## 2023-06-24 NOTE — Progress Notes (Signed)
 Physical Therapy Treatment Patient Details Name: Roberta Soto MRN: [id number] DOB: February 20, 1980 Today's Date: 06/24/2023   History of Present Illness Pt is a 43 y/o female admitted with R knee septic bursitis; S/p I&D R knee, WBAT, no ROM restrictions; recovery complicated by new, but asymptomatic LV dysfunction;  has a past medical history of Asthma and Hypertension.    PT Comments  Continuing work on functional mobility and activity tolerance;  Session focused on therex for increasing ROM, and gait; with very notable improvements in activity tolerance and weight bearing [place]rance RLE; and very nice abiltiy to lift RLE into bed without adaptive equipment; nice progress today    If plan is discharge home, recommend the following: A little help with walking and/or transfers;A little help with bathing/dressing/bathroom;Assistance with cooking/housework   Can travel by Training and development officer (2 wheels);BSC/3in1    Recommendations for Other Services       Precautions / Restrictions Precautions Precautions: Fall Recall of Precautions/Restrictions: Intact Restrictions Weight Bearing Restrictions Per Provider Order: Yes RLE Weight Bearing Per Provider Order: Weight bearing as tolerated     Mobility  Bed Mobility Overal bed mobility: Needs Assistance Bed Mobility: Supine to Sit, Sit to Supine     Supine to sit: Supervision     General bed mobility comments: increased time and use of gait belt as leg lifter for R LE, no physical assist required    Transfers Overall transfer level: Needs assistance Equipment used: None Transfers: Sit to/from Stand Sit to Stand: Contact guard assist           General transfer comment: increased time, min guard for safety at times but no LOB    Ambulation/Gait Ambulation/Gait assistance: Contact guard assist Gait Distance (Feet): 15 Feet (around the bed) Assistive device: None (furniture  walking) Gait Pattern/deviations: Wide base of support       General Gait Details: better tolerance of weight bearing on RLE   Stairs             Wheelchair Mobility     Tilt Bed    Modified Rankin (Stroke Patients Only)       Balance Overall balance assessment: Needs assistance   Sitting balance-Leahy Scale: Normal     Standing balance support: No upper extremity supported, During functional activity, Single extremity supported Standing balance-Leahy Scale: Poor Standing balance comment: preference to at least 1 UE support                            Communication Communication Communication: No apparent difficulties  Cognition Arousal: Alert Behavior During Therapy: WFL for tasks assessed/performed                             Following commands: Intact      Cueing Cueing Techniques: Verbal cues  Exercises Other Exercises Other Exercises: Seated knee flexion with other LE for overpressure x5    General Comments General comments (skin integrity, edema, etc.): applied ace wrap to RLE from toes to dressign at knee; assisted in elevating LE      Pertinent Vitals/Pain Pain Assessment Pain Assessment: Faces Faces Pain Scale: Hurts little more Pain Location: R LE Pain Descriptors / Indicators: Grimacing, Guarding Pain Intervention(s): Monitored during session    Home Living Family/patient expects to be discharged to:: Private residence Living Arrangements: Children (96 yo son) Available  Help at Discharge: Family;Available PRN/intermittently (reports sister "maybe" helping during the day) Type of Home: Other(Comment) (hotel first floor) Home Access: Stairs to enter   Entrance Stairs-Number of Steps: 1 (curb like step)   Home Layout: One level Home Equipment: None      Prior Function            PT Goals (current goals can now be found in the care plan section) Acute Rehab PT Goals PT Goal Formulation: With patient Time  For Goal Achievement: 07/06/23 Potential to Achieve Goals: Good Progress towards PT goals: Progressing toward goals    Frequency    Min 4X/week      PT Plan      Co-evaluation              AM-PAC PT "6 Clicks" Mobility   Outcome Measure  Help needed turning from your back to your side while in a flat bed without using bedrails?: A Little Help needed moving from lying on your back to sitting on the side of a flat bed without using bedrails?: A Little Help needed moving to and from a bed to a chair (including a wheelchair)?: A Little Help needed standing up from a chair using your arms (e.g., wheelchair or bedside chair)?: A Little Help needed to walk in hospital room?: A Lot Help needed climbing 3-5 steps with a railing? : A Lot 6 Click Score: 16    End of Session Equipment Utilized During Treatment: Gait belt Activity Tolerance: Patient tolerated treatment well Patient left: in chair;with call bell/phone within reach;with family/visitor present Nurse Communication: Mobility status PT Visit Diagnosis: Unsteadiness on feet (R26.81);Other abnormalities of gait and mobility (R26.89);Pain Pain - Right/Left: Right Pain - part of body: Leg     Time: 1208-1230 PT Time Calculation (min) (ACUTE ONLY): 22 min  Charges:    $Gait Training: 8-22 mins PT General Charges $$ ACUTE PT VISIT: 1 Visit                     Darcus Eastern, PT  Acute Rehabilitation Services Office [phone] Secure Chat welcomed    Marcial Setting 06/24/2023, 3:12 PM

## 2023-06-24 NOTE — Progress Notes (Signed)
 [facility] PROGRESS NOTE   Roberta Soto ZOX:[id number] DOB: [birth year] DOA: 06/19/2023  PCP: Patient, No Pcp Per  Brief History: 44 y.o. F with PMH significant for asthma and HTN who presented to the ED with confusion and R knee pain.  Patient was noted to be quite agitated and tachycardic.  She was initially admitted to the ICU.  There was concern for right knee infection.  Orthopedics was consulted.    Consultants: Orthopedics.  Critical care medicine.  Infectious disease  Procedures:   Incision and drainage of the right knee prepatellar bursa and packing with iodoform.  Aspiration of the knee joint.    Subjective/Interval History: Complains of pain in the right lower extremity.  Rationale for decreasing the dose of Klonopin was explained to patient.     Assessment/Plan:  Strep pyogenes bacteremia/right leg cellulitis/right knee bursitis/sepsis present on admission Patient had sepsis physiology at admission.   Blood culture from 5/8 is growing strep viridans.  Strep pyogenes also noted in right knee aspirate as well as aspirate from the prepatellar bursa. Repeat blood culture sent on 5/10 without any growth so far. Infectious diseases following.  Orthopedics is following. Patient is currently on ceftriaxone .  Linezolid was added by ID. WBC was elevated at 41.7 on 5/9.  Improvement noted in WBC.  Came down to 21.3 yesterday.  Will recheck labs tomorrow. CT of the right lower extremity was ordered by orthopedics.  Ultrasound of the right lower extremity was ordered to rule out DVT. Continue with narcotics and gabapentin.  Microcytic anemia/B12 deficiency Hemoglobin was 8.8 on 5/9.  Hemoglobin noted to be 7.9.  Drop in hemoglobin is likely dilutional. Anemia panel shows ferritin of 24, iron 10, TIBC 398, percent saturation 3.  Folic acid 9.9.  Vitamin B12 269. Will benefit from iron supplementation at discharge. Started on B12 supplementation  LV dysfunction EF noted  to be 30 to 35%.  Seen by cardiology who think this is likely due to severe infection and sepsis.   Management per cardiology.  Patient was started on losartan and metoprolol by cardiology.  Acute kidney injury/hypokalemia Came in with creatinine of 1.51.  Creatinine was 0.86 in September 2024.  Monitor urine output.  Creatinine is now back to baseline.  Monitor urine output. Supplement potassium.  Magnesium  noted to be 1.9.  History of anxiety Noted to be on Klonopin 1 mg 3 times a day.  This is a chronic home medication for her.  Verified on PDMP database.  Dose decreased due to excessive somnolence.  The rationale for this was explained to the patient and she understands.  Obesity Estimated body mass index is 45.82 kg/m as calculated from the following:   Height as of this encounter: 5\' 7"  (1.702 m).   Weight as of this encounter: 132.7 kg.   DVT Prophylaxis: Lovenox  Code Status: Full code Family Communication: Discussed with patient Disposition Plan: To be determined      Medications: Scheduled:  acetaminophen   650 mg Oral Q6H   Chlorhexidine Gluconate Cloth  6 each Topical Daily   clonazePAM  0.5 mg Oral TID   vitamin B-12  1,000 mcg Oral Daily   enoxaparin  (LOVENOX ) injection  60 mg Subcutaneous Q24H   escitalopram  10 mg Oral Daily   fluticasone furoate-vilanterol  1 puff Inhalation Daily   furosemide  20 mg Intravenous Daily   gabapentin  400 mg Oral TID   insulin  aspart  0-5 Units Subcutaneous QHS   insulin  aspart  0-9 Units  Subcutaneous TID WC   linezolid  600 mg Oral Q12H   losartan  25 mg Oral QPM   [START ON 06/25/2023] metoprolol succinate  25 mg Oral Daily   polyethylene glycol  17 g Oral Daily   senna-docusate  2 tablet Oral BID   Continuous:  cefTRIAXone  (ROCEPHIN )  IV 2 g (06/24/23 0554)   PRN:cyclobenzaprine, HYDROmorphone  (DILAUDID ) injection, ipratropium-albuterol , oxyCODONE  Antibiotics: Anti-infectives (From admission, onward)    Start      Dose/Rate Route Frequency Ordered Stop   06/23/23 2200  linezolid (ZYVOX) tablet 600 mg  Status:  Discontinued        600 mg Oral Every 12 hours 06/23/23 1535 06/23/23 1536   06/23/23 1600  linezolid (ZYVOX) tablet 600 mg        600 mg Oral Every 12 hours 06/23/23 1535 06/26/23 2159   06/21/23 0600  ceFAZolin (ANCEF) IVPB 3g/150 mL premix        3 g 300 mL/hr over 30 Minutes Intravenous On call to O.R. 06/20/23 1237 06/20/23 1301   06/21/23 0600  cefTRIAXone  (ROCEPHIN ) 2 g in sodium chloride  0.9 % 100 mL IVPB        2 g 200 mL/hr over 30 Minutes Intravenous Every 24 hours 06/21/23 0142     06/20/23 2000  penicillin G potassium 12 Million Units in dextrose 5 % 500 mL CONTINUOUS infusion  Status:  Discontinued        12 Million Units 41.7 mL/hr over 12 Hours Intravenous Every 12 hours 06/20/23 1551 06/21/23 0142   06/20/23 0900  vancomycin  (VANCOCIN ) IVPB 1000 mg/200 mL premix  Status:  Discontinued        1,000 mg 200 mL/hr over 60 Minutes Intravenous Every 12 hours 06/20/23 0759 06/20/23 1551   06/19/23 2200  cefTRIAXone  (ROCEPHIN ) 2 g in sodium chloride  0.9 % 100 mL IVPB  Status:  Discontinued        2 g 200 mL/hr over 30 Minutes Intravenous Every 24 hours 06/19/23 1845 06/20/23 1551   06/19/23 1849  vancomycin  variable dose per unstable renal function (pharmacist dosing)  Status:  Discontinued         Does not apply See admin instructions 06/19/23 1851 06/20/23 0759   06/19/23 1400  vancomycin  (VANCOREADY) IVPB 2000 mg/400 mL        2,000 mg 200 mL/hr over 120 Minutes Intravenous  Once 06/19/23 1350 06/19/23 1702   06/19/23 1400  piperacillin -tazobactam (ZOSYN ) IVPB 3.375 g        3.375 g 100 mL/hr over 30 Minutes Intravenous  Once 06/19/23 1350 06/19/23 1425       Objective:  Vital Signs  Vitals:   06/23/23 1144 06/23/23 1641 06/23/23 1933 06/24/23 0738  BP: 110/67 124/76 102/81 114/77  Pulse: (!) 108 (!) 112 (!) 104 (!) 103  Resp: 18 19 18    Temp:  98.5 F (36.9 C) 98.6  F (37 C) 98 F (36.7 C)  TempSrc:  Oral  Oral  SpO2: 98% 100% 100% 100%  Weight:      Height:       No intake or output data in the 24 hours ending 06/24/23 1057  Filed Weights   06/20/23 0500 06/21/23 0500 06/23/23 0714  Weight: 134.8 kg 128 kg 132.7 kg    General appearance: Awake alert.  In no distress Resp: Clear to auscultation bilaterally.  Normal effort Cardio: S1-S2 is normal regular.  No S3-S4.  No rubs murmurs or bruit GI: Abdomen is soft.  Nontender nondistended.  Bowel sounds are present normal.  No masses organomegaly Extremities: Swollen right lower extremity noted.  Dressing over the right knee.  Lab Results:  Data Reviewed: I have personally reviewed following labs and reports of the imaging studies  CBC: Recent Labs  Lab 06/19/23 1349 06/19/23 1402 06/19/23 2055 06/20/23 0230 06/22/23 1031 06/23/23 0508  WBC 25.8*  --  30.7* 41.7* 31.7* 21.3*  NEUTROABS 22.9*  --   --   --  27.6*  --   HGB 9.4* 10.9* 9.3* 8.8* 7.9* 7.9*  HCT 33.8* 32.0* 33.4* 31.9* 28.0* 27.7*  MCV 70.1*  --  69.9* 69.5* 68.6* 68.2*  PLT 462*  --  376 364 386 402*    Basic Metabolic Panel: Recent Labs  Lab 06/19/23 1348 06/19/23 1349 06/19/23 1349 06/19/23 1402 06/19/23 2055 06/20/23 0230 06/22/23 1031 06/23/23 0508 06/24/23 0658  NA  --  142   < > 141  --  141 138 139 137  K  --  3.3*   < > 3.2*  --  3.6 3.2* 3.5 3.7  CL  --  107  --   --   --  108 104 106 106  CO2  --  24  --   --   --  20* 23 23 23   GLUCOSE  --  131*  --   --   --  94 124* 94 89  BUN  --  14  --   --   --  14 11 9 8   CREATININE  --  1.51*   < >  --  1.58* 1.53* 1.16* 0.96 0.94  CALCIUM   --  8.1*  --   --   --  7.7* 8.1* 8.2* 8.3*  MG 1.8  --   --   --   --  1.5*  --  1.9  --   PHOS  --   --   --   --   --  5.2*  --   --   --    < > = values in this interval not displayed.    GFR: Estimated Creatinine Clearance: 111.9 mL/min (by C-G formula based on SCr of 0.94 mg/dL).  Liver Function  Tests: Recent Labs  Lab 06/19/23 1349 06/20/23 0230  AST 47* 36  ALT 48* 40  ALKPHOS 70 63  BILITOT 1.1 1.2  PROT 6.1* 5.4*  ALBUMIN 3.4* 2.8*     Coagulation Profile: Recent Labs  Lab 06/19/23 1349  INR 1.2    CBG: Recent Labs  Lab 06/23/23 0638 06/23/23 1147 06/23/23 1549 06/23/23 1933 06/24/23 0707  GLUCAP 94 114* 104* 104* 79     Recent Results (from the past 240 hours)  Blood Culture (routine x 2)     Status: None   Collection Time: 06/19/23 12:09 PM   Specimen: BLOOD LEFT ARM  Result Value Ref Range Status   Specimen Description BLOOD LEFT ARM  Final   Special Requests   Final    AEROBIC BOTTLE ONLY Blood Culture results may not be optimal due to an inadequate volume of blood received in culture bottles   Culture   Final    NO GROWTH 5 DAYS Performed at [facility], [address], [place], [place] [address]    Report Status 06/24/2023 FINAL  Final  Resp panel by RT-PCR (RSV, Flu A&B, Covid) Anterior Nasal Swab     Status: None   Collection Time: 06/19/23 12:10 PM   Specimen:  Anterior Nasal Swab  Result Value Ref Range Status   SARS Coronavirus 2 by RT PCR NEGATIVE NEGATIVE Final   Influenza A by PCR NEGATIVE NEGATIVE Final   Influenza B by PCR NEGATIVE NEGATIVE Final    Comment: (NOTE) The Xpert Xpress SARS-CoV-2/FLU/RSV plus assay is intended as an aid in the diagnosis of influenza from Nasopharyngeal swab specimens and should not be used as a sole basis for treatment. Nasal washings and aspirates are unacceptable for Xpert Xpress SARS-CoV-2/FLU/RSV testing.  Fact Sheet for Patients: BloggerCourse.com  Fact Sheet for Healthcare Providers: SeriousBroker.it  This test is not yet approved or cleared by the [place]  FDA and has been authorized for detection and/or diagnosis of SARS-CoV-2 by FDA under an Emergency Use Authorization (EUA). This EUA will remain in effect (meaning this  test can be used) for the duration of the COVID-19 declaration under Section 564(b)(1) of the Act, 21 U.S.C. section 360bbb-3(b)(1), unless the authorization is terminated or revoked.     Resp Syncytial Virus by PCR NEGATIVE NEGATIVE Final    Comment: (NOTE) Fact Sheet for Patients: BloggerCourse.com  Fact Sheet for Healthcare Providers: SeriousBroker.it  This test is not yet approved or cleared by the [place]  FDA and has been authorized for detection and/or diagnosis of SARS-CoV-2 by FDA under an Emergency Use Authorization (EUA). This EUA will remain in effect (meaning this test can be used) for the duration of the COVID-19 declaration under Section 564(b)(1) of the Act, 21 U.S.C. section 360bbb-3(b)(1), unless the authorization is terminated or revoked.  Performed at [facility], [address] [place], [place] [address]   Blood Culture (routine x 2)     Status: Abnormal   Collection Time: 06/19/23 12:14 PM   Specimen: BLOOD LEFT ARM  Result Value Ref Range Status   Specimen Description BLOOD LEFT ARM  Final   Special Requests   Final    BOTTLES DRAWN AEROBIC AND ANAEROBIC Blood Culture results may not be optimal due to an inadequate volume of blood received in culture bottles   Culture  Setup Time   Final    GRAM POSITIVE COCCI ANAEROBIC BOTTLE ONLY CRITICAL RESULT CALLED TO, READ BACK BY AND VERIFIED WITH: J WYLAND,[email or web address]  06/20/23 MK Performed at [facility], [address], [place], [place] [address]    Culture VIRIDANS STREPTOCOCCUS (A)  Final   Report Status 06/22/2023 FINAL  Final   Organism ID, Bacteria VIRIDANS STREPTOCOCCUS  Final      Susceptibility   Viridans streptococcus - MIC*    PENICILLIN 0.25 INTERMEDIATE Intermediate     CEFTRIAXONE  0.25 SENSITIVE Sensitive     ERYTHROMYCIN <=0.12 SENSITIVE Sensitive     LEVOFLOXACIN 1 SENSITIVE Sensitive     VANCOMYCIN  0.5 SENSITIVE  Sensitive     * VIRIDANS STREPTOCOCCUS  Blood Culture ID Panel (Reflexed)     Status: Abnormal   Collection Time: 06/19/23 12:14 PM  Result Value Ref Range Status   Enterococcus faecalis NOT DETECTED NOT DETECTED Final   Enterococcus Faecium NOT DETECTED NOT DETECTED Final   Listeria monocytogenes NOT DETECTED NOT DETECTED Final   Staphylococcus species NOT DETECTED NOT DETECTED Final   Staphylococcus aureus (BCID) NOT DETECTED NOT DETECTED Final   Staphylococcus epidermidis NOT DETECTED NOT DETECTED Final   Staphylococcus lugdunensis NOT DETECTED NOT DETECTED Final   Streptococcus species DETECTED (A) NOT DETECTED Final    Comment: Not Enterococcus species, Streptococcus agalactiae, Streptococcus pyogenes, or Streptococcus pneumoniae. CRITICAL RESULT CALLED TO, READ BACK  BY AND VERIFIED WITH: J WYLAND,[email or web address]  06/20/23 MK    Streptococcus agalactiae NOT DETECTED NOT DETECTED Final   Streptococcus pneumoniae NOT DETECTED NOT DETECTED Final   Streptococcus pyogenes NOT DETECTED NOT DETECTED Final   A.calcoaceticus-baumannii NOT DETECTED NOT DETECTED Final   Bacteroides fragilis NOT DETECTED NOT DETECTED Final   Enterobacterales NOT DETECTED NOT DETECTED Final   Enterobacter cloacae complex NOT DETECTED NOT DETECTED Final   Escherichia coli NOT DETECTED NOT DETECTED Final   Klebsiella aerogenes NOT DETECTED NOT DETECTED Final   Klebsiella oxytoca NOT DETECTED NOT DETECTED Final   Klebsiella pneumoniae NOT DETECTED NOT DETECTED Final   Proteus species NOT DETECTED NOT DETECTED Final   Salmonella species NOT DETECTED NOT DETECTED Final   Serratia marcescens NOT DETECTED NOT DETECTED Final   Haemophilus influenzae NOT DETECTED NOT DETECTED Final   Neisseria meningitidis NOT DETECTED NOT DETECTED Final   Pseudomonas aeruginosa NOT DETECTED NOT DETECTED Final   Stenotrophomonas maltophilia NOT DETECTED NOT DETECTED Final   Candida albicans NOT DETECTED NOT DETECTED Final   Candida  auris NOT DETECTED NOT DETECTED Final   Candida glabrata NOT DETECTED NOT DETECTED Final   Candida krusei NOT DETECTED NOT DETECTED Final   Candida parapsilosis NOT DETECTED NOT DETECTED Final   Candida tropicalis NOT DETECTED NOT DETECTED Final   Cryptococcus neoformans/gattii NOT DETECTED NOT DETECTED Final    Comment: Performed at [facility], [address] [place], [place] [address]  Body fluid culture w Gram Stain     Status: None   Collection Time: 06/19/23  1:02 PM   Specimen: Body Fluid  Result Value Ref Range Status   Specimen Description FLUID  Final   Special Requests RIGHT KNEE  Final   Gram Stain   Final    RARE WBC PRESENT,BOTH PMN AND MONONUCLEAR NO ORGANISMS SEEN    Culture   Final    RARE STREPTOCOCCUS PYOGENES Beta hemolytic streptococci are predictably susceptible to penicillin and other beta lactams. Susceptibility testing not routinely performed. Performed at [facility], [address] [place], [place] [address]    Report Status 06/21/2023 FINAL  Final  MRSA Next Gen by PCR, Nasal     Status: None   Collection Time: 06/19/23  5:56 PM   Specimen: Nasal Mucosa; Nasal Swab  Result Value Ref Range Status   MRSA by PCR Next Gen NOT DETECTED NOT DETECTED Final    Comment: (NOTE) The GeneXpert MRSA Assay (FDA approved for NASAL specimens only), is one component of a comprehensive MRSA colonization surveillance program. It is not intended to diagnose MRSA infection nor to guide or monitor treatment for MRSA infections. Test performance is not FDA approved in patients less than 89 years old. Performed at [facility], [address] [place], [place] [address]   Aerobic/Anaerobic Culture w Gram Stain (surgical/deep wound)     Status: None (Preliminary result)   Collection Time: 06/20/23  1:29 PM   Specimen: Wound  Result Value Ref Range Status   Specimen Description WOUND  Final   Special Requests RIGHT PREPATELLAR BURSA  Final   Gram  Stain   Final    NO WBC SEEN GRAM POSITIVE COCCI IN PAIRS Performed at [facility], [address] [place], [place] [address]    Culture   Final    RARE GROUP A STREP (S.PYOGENES) ISOLATED Beta hemolytic streptococci are predictably susceptible to penicillin and other beta lactams. Susceptibility testing not routinely performed. NO  ANAEROBES ISOLATED; CULTURE IN PROGRESS FOR 5 DAYS    Report Status PENDING  Incomplete  Aerobic/Anaerobic Culture w Gram Stain (surgical/deep wound)     Status: None (Preliminary result)   Collection Time: 06/20/23  1:34 PM   Specimen: Wound; Body Fluid  Result Value Ref Range Status   Specimen Description WOUND  Final   Special Requests RIGHT KNEE ASPIRATION  Final   Gram Stain NO WBC SEEN NO ORGANISMS SEEN   Final   Culture   Final    NO GROWTH 4 DAYS NO ANAEROBES ISOLATED; CULTURE IN PROGRESS FOR 5 DAYS Performed at [facility], [address] [place], [place] [address]    Report Status PENDING  Incomplete  Culture, blood (Routine X 2) w Reflex to ID Panel     Status: None (Preliminary result)   Collection Time: 06/21/23 10:22 AM   Specimen: BLOOD LEFT HAND  Result Value Ref Range Status   Specimen Description BLOOD LEFT HAND  Final   Special Requests   Final    BOTTLES DRAWN AEROBIC AND ANAEROBIC Blood Culture results may not be optimal due to an inadequate volume of blood received in culture bottles   Culture   Final    NO GROWTH 3 DAYS Performed at [facility], [address] [place], [place] [address]    Report Status PENDING  Incomplete  Culture, blood (Routine X 2) w Reflex to ID Panel     Status: None (Preliminary result)   Collection Time: 06/21/23 10:28 AM   Specimen: BLOOD RIGHT HAND  Result Value Ref Range Status   Specimen Description BLOOD RIGHT HAND  Final   Special Requests   Final    BOTTLES DRAWN AEROBIC AND ANAEROBIC Blood Culture results may not be optimal due to an inadequate volume of blood  received in culture bottles   Culture   Final    NO GROWTH 3 DAYS Performed at [facility], [address], [place], [place] [address]    Report Status PENDING  Incomplete      Radiology Studies: No results found.      LOS: 5 days   Larinda Herter [employer] on [email or web address]  06/24/2023, 10:57 AM

## 2023-06-24 NOTE — Progress Notes (Signed)
 [facility]    Date of Admission:  06/19/2023   Total days of antibiotics 6   ID: Roberta Soto is a 43 y.o. female with group A strep right knee septic arthritis, and prepatellar bursitis. Principal Problem:   Abscess of bursa of right knee Active Problems:   Sepsis  AMS  Hemodynamic instability   Chronic health problem   AMS (altered mental status)   Asymptomatic LV dysfunction   Benign hypertension   Class 3 severe obesity due to excess calories with serious comorbidity and body mass index (BMI) of 40.0 to 44.9 in adult    Subjective: Afebrile, but still having significant right leg pain involving thigh, knee, and ankle  Wbc slowly trending down to 18K  She has only undergone I x D of prepatellar bursitis.   Medications:   acetaminophen   650 mg Oral Q6H   Chlorhexidine Gluconate Cloth  6 each Topical Daily   clonazePAM  0.5 mg Oral TID   vitamin B-12  1,000 mcg Oral Daily   enoxaparin  (LOVENOX ) injection  60 mg Subcutaneous Q24H   escitalopram  10 mg Oral Daily   fluticasone furoate-vilanterol  1 puff Inhalation Daily   furosemide  20 mg Intravenous Daily   gabapentin  400 mg Oral TID   insulin  aspart  0-5 Units Subcutaneous QHS   insulin  aspart  0-9 Units Subcutaneous TID WC   linezolid  600 mg Oral Q12H   losartan  25 mg Oral QPM   [START ON 06/25/2023] metoprolol succinate  25 mg Oral Daily   polyethylene glycol  17 g Oral Daily   senna-docusate  2 tablet Oral BID    Objective: Vital signs in last 24 hours: Temp:  [98 F (36.7 C)-98.6 F (37 C)] 98.1 F (36.7 C) (05/13 1406) Pulse Rate:  [103-112] 103 (05/13 1406) Resp:  [18-19] 18 (05/12 1933) BP: (102-124)/(76-81) 116/77 (05/13 1406) SpO2:  [100 %] 100 % (05/13 1406) Physical Exam  Constitutional:  oriented to person, place, and time. appears well-developed and well-nourished. No distress.  HENT: [place]/AT, PERRLA, no scleral icterus Mouth/Throat: Oropharynx is clear and  moist. No oropharyngeal exudate.  Cardiovascular: Normal rate, regular rhythm and normal heart sounds. Exam reveals no gallop and no friction rub.  No murmur heard.  Pulmonary/Chest: Effort normal and breath sounds normal. No respiratory distress.  has no wheezes.  Neck = supple, no nuchal rigidity Abdominal: Soft. Bowel sounds are normal.  exhibits no distension. There is no tenderness.  Ext: right leg swollen indurated thigh, knee wrapped, still erythematous and ankle swelling and dorsum of foot swelling. Neurological: alert and oriented to person, place, and time.  Skin: Skin is warm and dry. No rash noted. No erythema.  Psychiatric: a normal mood and affect.  behavior is normal.    Lab Results Recent Labs    06/23/23 0508 06/24/23 0658  WBC 21.3* 18.2*  HGB 7.9* 8.0*  HCT 27.7* 28.8*  NA 139 137  K 3.5 3.7  CL 106 106  CO2 23 23  BUN 9 8  CREATININE 0.96 0.94   Lab Results  Component Value Date   ESRSEDRATE 3 06/19/2023    C-Reactive Protein Recent Labs    06/24/23 0658  CRP 15.8*    Microbiology: 5/8 knee aspirate + rare strep pyogenes 5/9 prepatellar bursa + strep pyogenes 5/8 blood cx 1/4 viridans strep Studies/Results: CT FEMUR RIGHT W CONTRAST Result Date: 06/24/2023 CLINICAL DATA:  Status post incision and drainage of  the right knee prepatellar bursa 06/20/2023. Presenting with pain bacteremia. EXAM: CT OF THE LOWER RIGHT EXTREMITY WITH CONTRAST TECHNIQUE: Multidetector CT imaging of the lower right extremity was performed according to the standard protocol following intravenous contrast administration. RADIATION DOSE REDUCTION: This exam was performed according to the departmental dose-optimization program which includes automated exposure control, adjustment of the mA and/or kV according to patient size and/or use of iterative reconstruction technique. CONTRAST:  OMNIPAQUE IOHEXOL 350 MG/ML SOLN COMPARISON:  06/19/2023. FINDINGS: Bones/Joint/Cartilage No  acute fracture or dislocation. Small knee joint effusion. Mild medial femorotibial compartment joint space narrowing. Mild degenerative changes of the right hip. Ligaments Ligaments are suboptimally evaluated by CT. Muscles and Tendons Muscles are normal. No muscle atrophy. No intramuscular fluid collection. Soft tissue Status post incision and drainage of the right knee prepatellar bursa with ill-defined region of hypodense fluid in the prepatellar soft tissues measuring approximately 3.3 x 1.7 cm in axial dimension and 4.1 cm in craniocaudal dimension (series 3, image 255 and series 8, image 66). Foci of soft tissue air along the superficial margin of the collection, just below the level of the skin surface. There is surrounding stranding and subcutaneous edema with overlying cutaneous thickening. Nonspecific generalized subcutaneous edema and cutaneous thickening of the visualized right lower extremity. IMPRESSION: 1. Status post incision and drainage of the right knee prepatellar bursa with ill-defined region of hypodense fluid of indeterminate sterility in the prepatellar soft tissues measuring approximately 3.3 x 1.7 x 4.1 cm with foci of soft tissue air along the superficial margin of the collection, just below the level of the skin surface. Organizing phlegmon/early abscess formation can not be excluded. Surrounding subcutaneous edema and cutaneous thickening is concerning for cellulitis. 2. Small right knee joint effusion. Electronically Signed   By: Mannie Seek M.D.   On: 06/24/2023 12:17   VAS US  LOWER EXTREMITY VENOUS (DVT) Result Date: 06/24/2023  Lower Venous DVT Study Patient Name:  Roberta Soto  Date of Exam:   06/24/2023 Medical Rec #: [id number]      Accession #:    [id number] Date of Birth: [birth year]      Patient Gender: F Patient Age:   39 years Exam Location:  [facility] Procedure:      VAS US  LOWER EXTREMITY VENOUS (DVT) Referring Phys: Dorthy Gavia  --------------------------------------------------------------------------------  Indications: Pain.  Limitations: Body habitus and poor ultrasound/tissue interface. Performing Technologist: Aloma Arrant RVS  Examination Guidelines: A complete evaluation includes B-mode imaging, spectral Doppler, color Doppler, and power Doppler as needed of all accessible portions of each vessel. Bilateral testing is considered an integral part of a complete examination. Limited examinations for reoccurring indications may be performed as noted. The reflux portion of the exam is performed with the patient in reverse Trendelenburg.  +--------+---------------+---------+-----------+----------+--------------------+ RIGHT   CompressibilityPhasicitySpontaneityPropertiesThrombus Aging       +--------+---------------+---------+-----------+----------+--------------------+ CFV     Full           Yes      Yes                                       +--------+---------------+---------+-----------+----------+--------------------+ SFJ     Full                                                              +--------+---------------+---------+-----------+----------+--------------------+  FV Prox Full                                                              +--------+---------------+---------+-----------+----------+--------------------+ FV Mid  Full                                                              +--------+---------------+---------+-----------+----------+--------------------+ FV                     Yes      Yes                  patent by color      Distal                                               doppler              +--------+---------------+---------+-----------+----------+--------------------+ PFV     Full                                                              +--------+---------------+---------+-----------+----------+--------------------+ POP     Full            Yes      Yes                                       +--------+---------------+---------+-----------+----------+--------------------+ PTV     Full                                                              +--------+---------------+---------+-----------+----------+--------------------+ PERO    Full                                                              +--------+---------------+---------+-----------+----------+--------------------+   +----+---------------+---------+-----------+----------+--------------+ LEFTCompressibilityPhasicitySpontaneityPropertiesThrombus Aging +----+---------------+---------+-----------+----------+--------------+ CFV Full           Yes      Yes                                 +----+---------------+---------+-----------+----------+--------------+     Summary: RIGHT: - There is no evidence of deep vein thrombosis in the lower extremity.  - No cystic structure found in the popliteal fossa.  LEFT: - No evidence of common femoral vein obstruction.   *See table(s) above for measurements and observations.    Preliminary      Assessment/Plan: Group a strep Septic arthritis of right knee and right prepatellar bursa = it looks like only bursa has had I xD. Please have orthopedics re-evaluate for wash out of knee joint given still significantly symptomatic. Please see if they think imaging is concerning for soft tissue infection that was done on 5/12. Overall I am concern that we don't have source control  Right ankle swelling = concern for polyarticular involvement with group a strep infection. Recommend for imaging of right ankle  Continue on dual therapy of ceftriaxone  2gm iv daily plus linezolid.  Leukocytosis = improving  [facility] Pager: [phone]  06/24/2023, 3:42 PM

## 2023-06-24 NOTE — Evaluation (Signed)
 Occupational Therapy Evaluation Patient Details Name: Roberta Soto MRN: [id number] DOB: 10-May-1980 Today's Date: 06/24/2023   History of Present Illness   Pt is a 43 y/o female admitted with R knee septic bursitis; S/p I&D R knee, WBAT, no ROM restrictions; recovery complicated by new, but asymptomatic LV dysfunction;  has a past medical history of Asthma and Hypertension.     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  Admitted for above and limited by problem list below.  Pt mobilizing better today, remains limited by pain in R knee and R LE edema.  Able to manage LB dressing with min guard support given increased time, completing transfers and mobility in room with min guard but relies on 1 UE (although denies RW use).  Anticipate she will progress well acutely with no further needs after dc home.       If plan is discharge home, recommend the following:   A little help with walking and/or transfers;A little help with bathing/dressing/bathroom;Assistance with cooking/housework     Functional Status Assessment   Patient has had a recent decline in their functional status and demonstrates the ability to make significant improvements in function in a reasonable and predictable amount of time.     Equipment Recommendations   Tub/shower seat;BSC/3in1     Recommendations for Other Services         Precautions/Restrictions   Precautions Precautions: Fall Recall of Precautions/Restrictions: Intact Restrictions Weight Bearing Restrictions Per Provider Order: Yes RLE Weight Bearing Per Provider Order: Weight bearing as tolerated     Mobility Bed Mobility Overal bed mobility: Needs Assistance Bed Mobility: Supine to Sit, Sit to Supine     Supine to sit: Supervision     General bed mobility comments: increased time and use of gait belt as leg lifter for R LE, no physical assist required     Transfers Overall transfer level: Needs assistance Equipment used: None Transfers: Sit to/from Stand Sit to Stand: Contact guard assist           General transfer comment: increased time, min guard for safety at times but no LOB      Balance Overall balance assessment: Needs assistance   Sitting balance-Leahy Scale: Normal     Standing balance support: No upper extremity supported, During functional activity, Single extremity supported Standing balance-Leahy Scale: Poor Standing balance comment: preference to at least 1 UE support                           ADL either performed or assessed with clinical judgement   ADL Overall ADL's : Needs assistance/impaired     Grooming: Set up;Sitting           Upper Body Dressing : Set up;Sitting   Lower Body Dressing: Contact guard assist;Sit to/from stand;Sitting/lateral leans Lower Body Dressing Details (indicate cue type and reason): greatly increased time for doffing and donning socks (esp R sock) but able to complete, min guard in standing without AD Toilet Transfer: Contact guard assist;Ambulation Toilet Transfer Details (indicate cue type and reason): declines RW but prefrence for 1 UE support         Functional mobility during ADLs: Contact guard assist       Vision         Perception         Praxis         Pertinent Vitals/Pain  Pain Assessment Pain Assessment: Faces Faces Pain Scale: Hurts little more Pain Location: R LE Pain Descriptors / Indicators: Grimacing, Guarding Pain Intervention(s): Limited activity within patient's tolerance, Monitored during session, Repositioned     Extremity/Trunk Assessment Upper Extremity Assessment Upper Extremity Assessment: Overall WFL for tasks assessed   Lower Extremity Assessment Lower Extremity Assessment: Defer to PT evaluation       Communication Communication Communication: No apparent difficulties   Cognition Arousal:  Alert Behavior During Therapy: WFL for tasks assessed/performed Cognition: No apparent impairments                               Following commands: Intact       Cueing  General Comments   Cueing Techniques: Verbal cues      Exercises     Shoulder Instructions      Home Living Family/patient expects to be discharged to:: Private residence Living Arrangements: Children (61 yo son) Available Help at Discharge: Family;Available PRN/intermittently (reports sister "maybe" helping during the day) Type of Home: Other(Comment) (hotel first floor) Home Access: Stairs to enter Entrance Stairs-Number of Steps: 1 (curb like step)   Home Layout: One level     Bathroom Shower/Tub: Chief Strategy Officer: Standard     Home Equipment: None          Prior Functioning/Environment Prior Level of Function : Independent/Modified Independent;Driving               ADLs Comments: 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    OT Problem List: Decreased activity tolerance;Decreased knowledge of use of DME or AE;Decreased knowledge of precautions;Obesity;Increased edema;Pain   OT Treatment/Interventions: Self-care/ADL training;Therapeutic exercise;DME and/or AE instruction;Energy conservation;Therapeutic activities;Patient/family education;Balance training      OT Goals(Current goals can be found in the care plan section)   Acute Rehab OT Goals Patient Stated Goal: home OT Goal Formulation: With patient Time For Goal Achievement: 07/08/23 Potential to Achieve Goals: Good   OT Frequency:  Min 2X/week    Co-evaluation              AM-PAC OT "6 Clicks" Daily Activity     Outcome Measure Help from another person eating meals?: None Help from another person taking care of personal grooming?: A Little Help from another person toileting, which includes using toliet, bedpan, or urinal?: A Little Help from another person bathing (including  washing, rinsing, drying)?: A Little Help from another person to put on and taking off regular upper body clothing?: A Little Help from another person to put on and taking off regular lower body clothing?: A Little 6 Click Score: 19   End of Session Nurse Communication: Mobility status (asking about IV getting removed)  Activity Tolerance: Patient tolerated treatment well Patient left: with call bell/phone within reach;Other (comment) (PT present)  OT Visit Diagnosis: Other abnormalities of gait and mobility (R26.89);Pain Pain - Right/Left: Right Pain - part of body: Knee;Leg                Time: 2130-8657 OT Time Calculation (min): 23 min Charges:  OT General Charges $OT Visit: 1 Visit OT Evaluation $OT Eval Moderate Complexity: 1 Mod  Bary Boss, OT Acute Rehabilitation Services Office [phone] Secure Chat Preferred    Fredrich Jefferson 06/24/2023, 1:36 PM

## 2023-06-25 ENCOUNTER — Other Ambulatory Visit (HOSPITAL_COMMUNITY): Payer: Self-pay

## 2023-06-25 ENCOUNTER — Telehealth (HOSPITAL_COMMUNITY): Payer: Self-pay | Admitting: Pharmacy Technician

## 2023-06-25 ENCOUNTER — Inpatient Hospital Stay (HOSPITAL_COMMUNITY)

## 2023-06-25 DIAGNOSIS — E66813 Obesity, class 3: Secondary | ICD-10-CM | POA: Diagnosis not present

## 2023-06-25 DIAGNOSIS — A400 Sepsis due to streptococcus, group A: Secondary | ICD-10-CM

## 2023-06-25 DIAGNOSIS — R7881 Bacteremia: Secondary | ICD-10-CM | POA: Diagnosis not present

## 2023-06-25 DIAGNOSIS — I429 Cardiomyopathy, unspecified: Secondary | ICD-10-CM

## 2023-06-25 DIAGNOSIS — M71061 Abscess of bursa, right knee: Secondary | ICD-10-CM | POA: Diagnosis not present

## 2023-06-25 DIAGNOSIS — I10 Essential (primary) hypertension: Secondary | ICD-10-CM | POA: Diagnosis not present

## 2023-06-25 LAB — CBC
HCT: 27.7 % — ABNORMAL LOW (ref 36.0–46.0)
Hemoglobin: 7.6 g/dL — ABNORMAL LOW (ref 12.0–15.0)
MCH: 19.3 pg — ABNORMAL LOW (ref 26.0–34.0)
MCHC: 27.4 g/dL — ABNORMAL LOW (ref 30.0–36.0)
MCV: 70.3 fL — ABNORMAL LOW (ref 80.0–100.0)
Platelets: 472 10*3/uL — ABNORMAL HIGH (ref 150–400)
RBC: 3.94 MIL/uL (ref 3.87–5.11)
RDW: 18.6 % — ABNORMAL HIGH (ref 11.5–15.5)
WBC: 12.6 10*3/uL — ABNORMAL HIGH (ref 4.0–10.5)
nRBC: 0.6 % — ABNORMAL HIGH (ref 0.0–0.2)

## 2023-06-25 LAB — C-REACTIVE PROTEIN: CRP: 13.5 mg/dL — ABNORMAL HIGH (ref <1.0)

## 2023-06-25 LAB — BASIC METABOLIC PANEL WITH GFR
Anion gap: 8 (ref 5–15)
BUN: 9 mg/dL (ref 6–20)
CO2: 24 mmol/L (ref 22–32)
Calcium: 8.2 mg/dL — ABNORMAL LOW (ref 8.9–10.3)
Chloride: 106 mmol/L (ref 98–111)
Creatinine, Ser: 0.98 mg/dL (ref 0.44–1.00)
GFR, Estimated: >60 mL/min (ref >60)
Glucose, Bld: 104 mg/dL — ABNORMAL HIGH (ref 70–99)
Potassium: 3.4 mmol/L — ABNORMAL LOW (ref 3.5–5.1)
Sodium: 138 mmol/L (ref 135–145)

## 2023-06-25 LAB — AEROBIC/ANAEROBIC CULTURE W GRAM STAIN (SURGICAL/DEEP WOUND)
Gram Stain: NONE SEEN
Gram Stain: NONE SEEN

## 2023-06-25 LAB — GLUCOSE, CAPILLARY
Glucose-Capillary: 113 mg/dL — ABNORMAL HIGH (ref 70–99)
Glucose-Capillary: 120 mg/dL — ABNORMAL HIGH (ref 70–99)
Glucose-Capillary: 91 mg/dL (ref 70–99)
Glucose-Capillary: 77 mg/dL (ref 70–99)

## 2023-06-25 MED ORDER — POTASSIUM CHLORIDE CRYS ER 20 MEQ PO TBCR
40.0000 meq | EXTENDED_RELEASE_TABLET | Freq: Once | ORAL | Status: AC
  Administered 2023-06-25: 40 meq via ORAL
  Filled 2023-06-25: qty 2

## 2023-06-25 MED ORDER — IOHEXOL 350 MG/ML SOLN
75.0000 mL | Freq: Once | INTRAVENOUS | Status: AC | PRN
  Administered 2023-06-25: 75 mL via INTRAVENOUS

## 2023-06-25 NOTE — Progress Notes (Signed)
 PROGRESS NOTE    Roberta Soto  HYQ:[id number] DOB: 05/24/1980 DOA: 06/19/2023 PCP: Patient, No Pcp Per   Brief Narrative: 43 y.o. F with PMH significant for asthma and HTN who presented to the ED with confusion and R knee pain. Patient was noted to be quite agitated and tachycardic. She was initially admitted to the ICU. There was concern for right knee infection. Orthopedics was consulted.  Patient has been seen in consultation by PCCM Ortho infectious disease and cardiology.  Assessment & Plan:   Principal Problem:   Abscess of bursa of right knee Active Problems:   Sepsis  AMS  Hemodynamic instability   Chronic health problem   AMS (altered mental status)   Asymptomatic LV dysfunction   Benign hypertension   Class 3 severe obesity due to excess calories with serious comorbidity and body mass index (BMI) of 40.0 to 44.9 in adult   Sepsis due to Streptococcus pyogenes (HCC)   Cardiomyopathy (HCC)   Strep pyogenes bacteremia/right leg cellulitis/right knee bursitis/sepsis present on admission status post irrigation and debridement of the right knee Patient had sepsis physiology at admission.   Blood culture from 5/8 is growing strep viridans.  Strep pyogenes also noted in right knee aspirate as well as aspirate from the prepatellar bursa. Repeat blood culture sent on 5/10 without any growth so far. Infectious diseases following.  Orthopedics is following. Patient is currently on ceftriaxone .  Linezolid was added by ID. WBC was elevated at 41.7 on 5/9.  W BC trended down to 12.6.   CT of the right lower extremity - Status post incision and drainage of the right knee prepatellar bursa with ill-defined region of hypodense fluid of indeterminate sterility in the prepatellar soft tissues measuring approximately 3.3 x 1.7 x 4.1 cm with foci of soft tissue air along the superficial margin of the collection, just below the level of the skin surface. Organizing phlegmon/early abscess  formation can not be excluded. Surrounding subcutaneous edema and cutaneous thickening is concerning for cellulitis. Small right knee joint effusion Venous Doppler showed no evidence of DVT Continue with narcotics and gabapentin.   Microcytic anemia/B12 deficiency Hemoglobin was 8.8 on 5/9.  Hemoglobin noted to be 7.9.  Drop in hemoglobin is likely dilutional. Anemia panel shows ferritin of 24, iron 10, TIBC 398, percent saturation 3.  Folic acid 9.9.  Vitamin B12 269. Will benefit from iron supplementation at discharge. Started on B12 supplementation   LV dysfunction EF noted to be 30 to 35%.  Seen by cardiology who think this is likely due to severe infection and sepsis.   Management per cardiology.  Patient was started on losartan and metoprolol by cardiology.   Acute kidney injury/hypokalemia Came in with creatinine of 1.51.  Creatinine was 0.86 in September 2024.  Monitor urine output.  Creatinine is now back to baseline.  Monitor urine output. Supplement potassium.  Magnesium  noted to be 1.9.   History of anxiety Noted to be on Klonopin 1 mg 3 times a day.  This is a chronic home medication for her.  Verified on PDMP database.  Dose decreased due to excessive somnolence.  The rationale for this was explained to the patient and she understands.   Obesity Estimated body mass index is 45.82 kg/m as calculated from the following:   Height as of this encounter: 5\' 7"  (1.702 m).   Weight as of this encounter: 132.7 kg.   Estimated body mass index is 45.82 kg/m as calculated from the following:   Height  as of this encounter: 5\' 7"  (1.702 m).   Weight as of this encounter: 132.7 kg.  DVT prophylaxis: lovenox  Code Status: full Family Communication:none) Disposition Plan:  Status is: Inpatient Remains inpatient appropriate because: acute illness   Consultants: Infectious disease, cardiology, Ortho, PCCM  Procedures:  Antimicrobials: Rocephin  and Zyvox Subjective: She feels  improved compared to yesterday right lower extremity still swollen she feels a tight it is swollen not on diuretics prior to admission CT of the ankle right is pending  Objective: Vitals:   06/24/23 1937 06/25/23 0422 06/25/23 0749 06/25/23 0830  BP: 109/76 105/67 104/73   Pulse: (!) 106 96 89 89  Resp: 18  18 18   Temp: 99.2 F (37.3 C) 98.1 F (36.7 C) 97.6 F (36.4 C)   TempSrc: Oral Oral Oral   SpO2: 99% 100% 100% 100%  Weight:      Height:        Intake/Output Summary (Last 24 hours) at 06/25/2023 1201 Last data filed at 06/25/2023 0830 Gross per 24 hour  Intake 777 ml  Output 200 ml  Net 577 ml   Filed Weights   06/20/23 0500 06/21/23 0500 06/23/23 0714  Weight: 134.8 kg 128 kg 132.7 kg    Examination:  General exam: Appears in no acute distress  respiratory system: Clear to auscultation. Respiratory effort normal. Cardiovascular system: S1 & S2 heard, RRR. No JVD, murmurs, rubs, gallops or clicks. No pedal edema. Gastrointestinal system: Abdomen is nondistended, soft and nontender. No organomegaly or masses felt. Normal bowel sounds heard. Central nervous system: Alert and oriented. No focal neurological deficits. Extremities: Right leg edema erythema from right foot to right upper thighs   Data Reviewed: I have personally reviewed following labs and imaging studies  CBC: Recent Labs  Lab 06/19/23 1349 06/19/23 1402 06/20/23 0230 06/22/23 1031 06/23/23 0508 06/24/23 0658 06/25/23 0312  WBC 25.8*   < > 41.7* 31.7* 21.3* 18.2* 12.6*  NEUTROABS 22.9*  --   --  27.6*  --   --   --   HGB 9.4*   < > 8.8* 7.9* 7.9* 8.0* 7.6*  HCT 33.8*   < > 31.9* 28.0* 27.7* 28.8* 27.7*  MCV 70.1*   < > 69.5* 68.6* 68.2* 68.4* 70.3*  PLT 462*   < > 364 386 402* 458* 472*   < > = values in this interval not displayed.   Basic Metabolic Panel: Recent Labs  Lab 06/19/23 1348 06/19/23 1349 06/20/23 0230 06/22/23 1031 06/23/23 0508 06/24/23 0658 06/25/23 0312  NA  --     < > 141 138 139 137 138  K  --    < > 3.6 3.2* 3.5 3.7 3.4*  CL  --    < > 108 104 106 106 106  CO2  --    < > 20* 23 23 23 24   GLUCOSE  --    < > 94 124* 94 89 104*  BUN  --    < > 14 11 9 8 9   CREATININE  --    < > 1.53* 1.16* 0.96 0.94 0.98  CALCIUM   --    < > 7.7* 8.1* 8.2* 8.3* 8.2*  MG 1.8  --  1.5*  --  1.9  --   --   PHOS  --   --  5.2*  --   --   --   --    < > = values in this interval not displayed.   GFR: Estimated Creatinine  Clearance: 107.3 mL/min (by C-G formula based on SCr of 0.98 mg/dL). Liver Function Tests: Recent Labs  Lab 06/19/23 1349 06/20/23 0230  AST 47* 36  ALT 48* 40  ALKPHOS 70 63  BILITOT 1.1 1.2  PROT 6.1* 5.4*  ALBUMIN 3.4* 2.8*   No results for input(s): "LIPASE", "AMYLASE" in the last 168 hours. No results for input(s): "AMMONIA" in the last 168 hours. Coagulation Profile: Recent Labs  Lab 06/19/23 1349  INR 1.2   Cardiac Enzymes: No results for input(s): "CKTOTAL", "CKMB", "CKMBINDEX", "TROPONINI" in the last 168 hours. BNP (last 3 results) No results for input(s): "PROBNP" in the last 8760 hours. HbA1C: No results for input(s): "HGBA1C" in the last 72 hours. CBG: Recent Labs  Lab 06/24/23 1111 06/24/23 1620 06/24/23 2112 06/25/23 0628 06/25/23 1145  GLUCAP 123* 74 140* 113* 120*   Lipid Profile: Recent Labs    06/24/23 0658  CHOL 119  HDL 18*  LDLCALC 76  TRIG 401  CHOLHDL 6.6   Thyroid Function Tests: No results for input(s): "TSH", "T4TOTAL", "FREET4", "T3FREE", "THYROIDAB" in the last 72 hours. Anemia Panel: No results for input(s): "VITAMINB12", "FOLATE", "FERRITIN", "TIBC", "IRON", "RETICCTPCT" in the last 72 hours. Sepsis Labs: Recent Labs  Lab 06/19/23 1402 06/19/23 1728 06/19/23 2055 06/20/23 1918  PROCALCITON  --   --  6.37  --   LATICACIDVEN 2.3* 2.5* 2.2* 0.9    Recent Results (from the past 240 hours)  Blood Culture (routine x 2)     Status: None   Collection Time: 06/19/23 12:09 PM    Specimen: BLOOD LEFT ARM  Result Value Ref Range Status   Specimen Description BLOOD LEFT ARM  Final   Special Requests   Final    AEROBIC BOTTLE ONLY Blood Culture results may not be optimal due to an inadequate volume of blood received in culture bottles   Culture   Final    NO GROWTH 5 DAYS Performed at [facility], [address] [place], [place] [address]    Report Status 06/24/2023 FINAL  Final  Resp panel by RT-PCR (RSV, Flu A&B, Covid) Anterior Nasal Swab     Status: None   Collection Time: 06/19/23 12:10 PM   Specimen: Anterior Nasal Swab  Result Value Ref Range Status   SARS Coronavirus 2 by RT PCR NEGATIVE NEGATIVE Final   Influenza A by PCR NEGATIVE NEGATIVE Final   Influenza B by PCR NEGATIVE NEGATIVE Final    Comment: (NOTE) The Xpert Xpress SARS-CoV-2/FLU/RSV plus assay is intended as an aid in the diagnosis of influenza from Nasopharyngeal swab specimens and should not be used as a sole basis for treatment. Nasal washings and aspirates are unacceptable for Xpert Xpress SARS-CoV-2/FLU/RSV testing.  Fact Sheet for Patients: BloggerCourse.com  Fact Sheet for Healthcare Providers: SeriousBroker.it  This test is not yet approved or cleared by the [place]  FDA and has been authorized for detection and/or diagnosis of SARS-CoV-2 by FDA under an Emergency Use Authorization (EUA). This EUA will remain in effect (meaning this test can be used) for the duration of the COVID-19 declaration under Section 564(b)(1) of the Act, 21 U.S.C. section 360bbb-3(b)(1), unless the authorization is terminated or revoked.     Resp Syncytial Virus by PCR NEGATIVE NEGATIVE Final    Comment: (NOTE) Fact Sheet for Patients: BloggerCourse.com  Fact Sheet for Healthcare Providers: SeriousBroker.it  This test is not yet approved or cleared by the [place]  FDA and has been  authorized for detection  and/or diagnosis of SARS-CoV-2 by FDA under an Emergency Use Authorization (EUA). This EUA will remain in effect (meaning this test can be used) for the duration of the COVID-19 declaration under Section 564(b)(1) of the Act, 21 U.S.C. section 360bbb-3(b)(1), unless the authorization is terminated or revoked.  Performed at [facility], [address] [place], [place] [address]   Blood Culture (routine x 2)     Status: Abnormal   Collection Time: 06/19/23 12:14 PM   Specimen: BLOOD LEFT ARM  Result Value Ref Range Status   Specimen Description BLOOD LEFT ARM  Final   Special Requests   Final    BOTTLES DRAWN AEROBIC AND ANAEROBIC Blood Culture results may not be optimal due to an inadequate volume of blood received in culture bottles   Culture  Setup Time   Final    GRAM POSITIVE COCCI ANAEROBIC BOTTLE ONLY CRITICAL RESULT CALLED TO, READ BACK BY AND VERIFIED WITH: J [facility]  06/20/23 MK Performed at [facility], [address], [place], [place] [address]    Culture VIRIDANS STREPTOCOCCUS (A)  Final   Report Status 06/22/2023 FINAL  Final   Organism ID, Bacteria VIRIDANS STREPTOCOCCUS  Final      Susceptibility   Viridans streptococcus - MIC*    PENICILLIN 0.25 INTERMEDIATE Intermediate     CEFTRIAXONE  0.25 SENSITIVE Sensitive     ERYTHROMYCIN <=0.12 SENSITIVE Sensitive     LEVOFLOXACIN 1 SENSITIVE Sensitive     VANCOMYCIN  0.5 SENSITIVE Sensitive     * VIRIDANS STREPTOCOCCUS  Blood Culture ID Panel (Reflexed)     Status: Abnormal   Collection Time: 06/19/23 12:14 PM  Result Value Ref Range Status   Enterococcus faecalis NOT DETECTED NOT DETECTED Final   Enterococcus Faecium NOT DETECTED NOT DETECTED Final   Listeria monocytogenes NOT DETECTED NOT DETECTED Final   Staphylococcus species NOT DETECTED NOT DETECTED Final   Staphylococcus aureus (BCID) NOT DETECTED NOT DETECTED Final   Staphylococcus epidermidis NOT DETECTED NOT  DETECTED Final   Staphylococcus lugdunensis NOT DETECTED NOT DETECTED Final   Streptococcus species DETECTED (A) NOT DETECTED Final    Comment: Not Enterococcus species, Streptococcus agalactiae, Streptococcus pyogenes, or Streptococcus pneumoniae. CRITICAL RESULT CALLED TO, READ BACK BY AND VERIFIED WITH: J WYLAND,[email or web address]  06/20/23 MK    Streptococcus agalactiae NOT DETECTED NOT DETECTED Final   Streptococcus pneumoniae NOT DETECTED NOT DETECTED Final   Streptococcus pyogenes NOT DETECTED NOT DETECTED Final   A.calcoaceticus-baumannii NOT DETECTED NOT DETECTED Final   Bacteroides fragilis NOT DETECTED NOT DETECTED Final   Enterobacterales NOT DETECTED NOT DETECTED Final   Enterobacter cloacae complex NOT DETECTED NOT DETECTED Final   Escherichia coli NOT DETECTED NOT DETECTED Final   Klebsiella aerogenes NOT DETECTED NOT DETECTED Final   Klebsiella oxytoca NOT DETECTED NOT DETECTED Final   Klebsiella pneumoniae NOT DETECTED NOT DETECTED Final   Proteus species NOT DETECTED NOT DETECTED Final   Salmonella species NOT DETECTED NOT DETECTED Final   Serratia marcescens NOT DETECTED NOT DETECTED Final   Haemophilus influenzae NOT DETECTED NOT DETECTED Final   Neisseria meningitidis NOT DETECTED NOT DETECTED Final   Pseudomonas aeruginosa NOT DETECTED NOT DETECTED Final   Stenotrophomonas maltophilia NOT DETECTED NOT DETECTED Final   Candida albicans NOT DETECTED NOT DETECTED Final   Candida auris NOT DETECTED NOT DETECTED Final   Candida glabrata NOT DETECTED NOT DETECTED Final   Candida krusei NOT DETECTED NOT DETECTED Final   Candida parapsilosis NOT DETECTED NOT DETECTED Final  Candida tropicalis NOT DETECTED NOT DETECTED Final   Cryptococcus neoformans/gattii NOT DETECTED NOT DETECTED Final    Comment: Performed at [facility], [address], [place], [place] [address]  Body fluid culture w Gram Stain     Status: None   Collection Time: 06/19/23  1:02 PM   Specimen:  Body Fluid  Result Value Ref Range Status   Specimen Description FLUID  Final   Special Requests RIGHT KNEE  Final   Gram Stain   Final    RARE WBC PRESENT,BOTH PMN AND MONONUCLEAR NO ORGANISMS SEEN    Culture   Final    RARE STREPTOCOCCUS PYOGENES Beta hemolytic streptococci are predictably susceptible to penicillin and other beta lactams. Susceptibility testing not routinely performed. Performed at [facility], [address], [place], [place] [address]    Report Status 06/21/2023 FINAL  Final  MRSA Next Gen by PCR, Nasal     Status: None   Collection Time: 06/19/23  5:56 PM   Specimen: Nasal Mucosa; Nasal Swab  Result Value Ref Range Status   MRSA by PCR Next Gen NOT DETECTED NOT DETECTED Final    Comment: (NOTE) The GeneXpert MRSA Assay (FDA approved for NASAL specimens only), is one component of a comprehensive MRSA colonization surveillance program. It is not intended to diagnose MRSA infection nor to guide or monitor treatment for MRSA infections. Test performance is not FDA approved in patients less than 40 years old. Performed at [facility], [address] [place], [place] [address]   Aerobic/Anaerobic Culture w Gram Stain (surgical/deep wound)     Status: None (Preliminary result)   Collection Time: 06/20/23  1:29 PM   Specimen: Wound  Result Value Ref Range Status   Specimen Description WOUND  Final   Special Requests RIGHT PREPATELLAR BURSA  Final   Gram Stain   Final    NO WBC SEEN GRAM POSITIVE COCCI IN PAIRS Performed at [facility], [address] [place], [place] [address]    Culture   Final    RARE GROUP A STREP (S.PYOGENES) ISOLATED Beta hemolytic streptococci are predictably susceptible to penicillin and other beta lactams. Susceptibility testing not routinely performed. NO ANAEROBES ISOLATED; CULTURE IN PROGRESS FOR 5 DAYS    Report Status PENDING  Incomplete  Aerobic/Anaerobic Culture w Gram Stain (surgical/deep wound)      Status: None (Preliminary result)   Collection Time: 06/20/23  1:34 PM   Specimen: Wound; Body Fluid  Result Value Ref Range Status   Specimen Description WOUND  Final   Special Requests RIGHT KNEE ASPIRATION  Final   Gram Stain NO WBC SEEN NO ORGANISMS SEEN   Final   Culture   Final    NO GROWTH 4 DAYS NO ANAEROBES ISOLATED; CULTURE IN PROGRESS FOR 5 DAYS Performed at [facility], [address] [place], [place] [address]    Report Status PENDING  Incomplete  Culture, blood (Routine X 2) w Reflex to ID Panel     Status: None (Preliminary result)   Collection Time: 06/21/23 10:22 AM   Specimen: BLOOD LEFT HAND  Result Value Ref Range Status   Specimen Description BLOOD LEFT HAND  Final   Special Requests   Final    BOTTLES DRAWN AEROBIC AND ANAEROBIC Blood Culture results may not be optimal due to an inadequate volume of blood received in culture bottles   Culture   Final    NO GROWTH 4 DAYS  Performed at [facility], [address], [place], [place] [address]    Report Status PENDING  Incomplete  Culture, blood (Routine X 2) w Reflex to ID Panel     Status: None (Preliminary result)   Collection Time: 06/21/23 10:28 AM   Specimen: BLOOD RIGHT HAND  Result Value Ref Range Status   Specimen Description BLOOD RIGHT HAND  Final   Special Requests   Final    BOTTLES DRAWN AEROBIC AND ANAEROBIC Blood Culture results may not be optimal due to an inadequate volume of blood received in culture bottles   Culture   Final    NO GROWTH 4 DAYS Performed at [facility], [address] [place], [place] [address]    Report Status PENDING  Incomplete         Radiology Studies: VAS US  LOWER EXTREMITY VENOUS (DVT) Result Date: 06/24/2023  Lower Venous DVT Study Patient Name:  ILAN GUYSE  Date of Exam:   06/24/2023 Medical Rec #: [id number]      Accession #:    [id number] Date of Birth: [birth year]      Patient Gender: F Patient Age:   27 years Exam Location:  [facility] Procedure:      VAS US  LOWER EXTREMITY VENOUS (DVT) Referring Phys: Dorthy Gavia --------------------------------------------------------------------------------  Indications: Pain.  Limitations: Body habitus and poor ultrasound/tissue interface. Performing Technologist: Aloma Arrant RVS  Examination Guidelines: A complete evaluation includes B-mode imaging, spectral Doppler, color Doppler, and power Doppler as needed of all accessible portions of each vessel. Bilateral testing is considered an integral part of a complete examination. Limited examinations for reoccurring indications may be performed as noted. The reflux portion of the exam is performed with the patient in reverse Trendelenburg.  +--------+---------------+---------+-----------+----------+--------------------+ RIGHT   CompressibilityPhasicitySpontaneityPropertiesThrombus Aging       +--------+---------------+---------+-----------+----------+--------------------+ CFV     Full           Yes      Yes                                       +--------+---------------+---------+-----------+----------+--------------------+ SFJ     Full                                                              +--------+---------------+---------+-----------+----------+--------------------+ FV Prox Full                                                              +--------+---------------+---------+-----------+----------+--------------------+ FV Mid  Full                                                              +--------+---------------+---------+-----------+----------+--------------------+ FV  Yes      Yes                  patent by color      Distal                                               doppler              +--------+---------------+---------+-----------+----------+--------------------+ PFV     Full                                                               +--------+---------------+---------+-----------+----------+--------------------+ POP     Full           Yes      Yes                                       +--------+---------------+---------+-----------+----------+--------------------+ PTV     Full                                                              +--------+---------------+---------+-----------+----------+--------------------+ PERO    Full                                                              +--------+---------------+---------+-----------+----------+--------------------+   +----+---------------+---------+-----------+----------+--------------+ LEFTCompressibilityPhasicitySpontaneityPropertiesThrombus Aging +----+---------------+---------+-----------+----------+--------------+ CFV Full           Yes      Yes                                 +----+---------------+---------+-----------+----------+--------------+     Summary: RIGHT: - There is no evidence of deep vein thrombosis in the lower extremity.  - No cystic structure found in the popliteal fossa.  LEFT: - No evidence of common femoral vein obstruction.   *See table(s) above for measurements and observations. Electronically signed by Delaney Fearing on 06/24/2023 at 4:07:21 PM.    Final    CT FEMUR RIGHT W CONTRAST Result Date: 06/24/2023 CLINICAL DATA:  Status post incision and drainage of the right knee prepatellar bursa 06/20/2023. Presenting with pain bacteremia. EXAM: CT OF THE LOWER RIGHT EXTREMITY WITH CONTRAST TECHNIQUE: Multidetector CT imaging of the lower right extremity was performed according to the standard protocol following intravenous contrast administration. RADIATION DOSE REDUCTION: This exam was performed according to the departmental dose-optimization program which includes automated exposure control, adjustment of the mA and/or kV according to patient size and/or use of iterative reconstruction technique. CONTRAST:  OMNIPAQUE IOHEXOL  350 MG/ML SOLN COMPARISON:  06/19/2023. FINDINGS: Bones/Joint/Cartilage No acute fracture or dislocation. Small knee joint effusion. Mild medial femorotibial compartment  joint space narrowing. Mild degenerative changes of the right hip. Ligaments Ligaments are suboptimally evaluated by CT. Muscles and Tendons Muscles are normal. No muscle atrophy. No intramuscular fluid collection. Soft tissue Status post incision and drainage of the right knee prepatellar bursa with ill-defined region of hypodense fluid in the prepatellar soft tissues measuring approximately 3.3 x 1.7 cm in axial dimension and 4.1 cm in craniocaudal dimension (series 3, image 255 and series 8, image 66). Foci of soft tissue air along the superficial margin of the collection, just below the level of the skin surface. There is surrounding stranding and subcutaneous edema with overlying cutaneous thickening. Nonspecific generalized subcutaneous edema and cutaneous thickening of the visualized right lower extremity. IMPRESSION: 1. Status post incision and drainage of the right knee prepatellar bursa with ill-defined region of hypodense fluid of indeterminate sterility in the prepatellar soft tissues measuring approximately 3.3 x 1.7 x 4.1 cm with foci of soft tissue air along the superficial margin of the collection, just below the level of the skin surface. Organizing phlegmon/early abscess formation can not be excluded. Surrounding subcutaneous edema and cutaneous thickening is concerning for cellulitis. 2. Small right knee joint effusion. Electronically Signed   By: Mannie Seek M.D.   On: 06/24/2023 12:17        Scheduled Meds:  acetaminophen   650 mg Oral Q6H   clonazePAM  0.5 mg Oral TID   vitamin B-12  1,000 mcg Oral Daily   enoxaparin  (LOVENOX ) injection  60 mg Subcutaneous Q24H   escitalopram  10 mg Oral Daily   fluticasone furoate-vilanterol  1 puff Inhalation Daily   furosemide  20 mg Intravenous Daily   gabapentin  400 mg  Oral TID   insulin  aspart  0-5 Units Subcutaneous QHS   insulin  aspart  0-9 Units Subcutaneous TID WC   linezolid  600 mg Oral Q12H   losartan  25 mg Oral QPM   metoprolol succinate  25 mg Oral Daily   polyethylene glycol  17 g Oral Daily   senna-docusate  2 tablet Oral BID   sodium chloride  flush  10-40 mL Intracatheter Q12H   Continuous Infusions:  cefTRIAXone  (ROCEPHIN )  IV Stopped (06/25/23 9562)     LOS: 6 days    Time spent: 50 minutes  Barbee Lew, MD 06/25/2023, 12:01 PM

## 2023-06-25 NOTE — Progress Notes (Signed)
 Patient's blood glucose is 77, she is alert and oriented, currently having her night snack and drinking her cranberry juice.

## 2023-06-25 NOTE — Progress Notes (Signed)
 Physical Therapy Treatment Patient Details Name: Roberta Soto MRN: [id number] DOB: Feb 22, 1980 Today's Date: 06/25/2023   History of Present Illness Pt is a 43 y/o female admitted with R knee septic bursitis; S/p I&D R knee, WBAT, no ROM restrictions; recovery complicated by new, but asymptomatic LV dysfunction;  has a past medical history of Asthma and Hypertension.    PT Comments  Pt is progressing very well, with greatly improved ambulation tolerance. Pt demonstrates continued improvement in quality of bed mobility and transfers as well. Pt with a complaint of continued edema at R foot/leg, PT provides education on use of ankle pumps, elevation and ice to aide in reducing edema. PT will continue to follow this admission.    If plan is discharge home, recommend the following: A little help with walking and/or transfers;A little help with bathing/dressing/bathroom;Assistance with cooking/housework   Can travel by Training and development officer (2 wheels);BSC/3in1    Recommendations for Other Services       Precautions / Restrictions Precautions Precautions: Fall Recall of Precautions/Restrictions: Intact Restrictions Weight Bearing Restrictions Per Provider Order: Yes RLE Weight Bearing Per Provider Order: Weight bearing as tolerated     Mobility  Bed Mobility Overal bed mobility: Modified Independent Bed Mobility: Supine to Sit, Sit to Supine     Supine to sit: Modified independent (Device/Increase time), HOB elevated Sit to supine: Modified independent (Device/Increase time), HOB elevated   General bed mobility comments: use of gait belt as leg lifter    Transfers Overall transfer level: Needs assistance Equipment used: Rolling walker (2 wheels) Transfers: Sit to/from Stand Sit to Stand: Supervision                Ambulation/Gait Ambulation/Gait assistance: Supervision Gait Distance (Feet): 150 Feet Assistive device:  Rolling walker (2 wheels) Gait Pattern/deviations: Step-through pattern, Trunk flexed Gait velocity: reduced Gait velocity interpretation: <1.8 ft/sec, indicate of risk for recurrent falls   General Gait Details: pt with steady step-through gait, increased RLE flexion during swing phase with possible mild circumduction noted. One period of brief standing rest break with pt leaning on RW   Stairs             Wheelchair Mobility     Tilt Bed    Modified Rankin (Stroke Patients Only)       Balance Overall balance assessment: Needs assistance Sitting-balance support: Feet supported, No upper extremity supported Sitting balance-Leahy Scale: Normal     Standing balance support: No upper extremity supported, During functional activity Standing balance-Leahy Scale: Fair                              Hotel manager: No apparent difficulties  Cognition Arousal: Alert Behavior During Therapy: WFL for tasks assessed/performed   PT - Cognitive impairments: No apparent impairments                         Following commands: Intact      Cueing Cueing Techniques: Verbal cues  Exercises Other Exercises Other Exercises: encouraged ankle pumps to aide in reducing edema at foot, ice also provided    General Comments General comments (skin integrity, edema, etc.): VSS on RA      Pertinent Vitals/Pain Pain Assessment Pain Assessment: Faces Faces Pain Scale: Hurts little more Pain Location: R knee Pain Descriptors / Indicators: Grimacing Pain Intervention(s): Monitored during session  Home Living                          Prior Function            PT Goals (current goals can now be found in the care plan section) Acute Rehab PT Goals Patient Stated Goal: Hopes to get home soon Progress towards PT goals: Progressing toward goals    Frequency    Min 4X/week      PT Plan      Co-evaluation               AM-PAC PT "6 Clicks" Mobility   Outcome Measure  Help needed turning from your back to your side while in a flat bed without using bedrails?: None Help needed moving from lying on your back to sitting on the side of a flat bed without using bedrails?: None Help needed moving to and from a bed to a chair (including a wheelchair)?: A Little Help needed standing up from a chair using your arms (e.g., wheelchair or bedside chair)?: A Little Help needed to walk in hospital room?: A Little Help needed climbing 3-5 steps with a railing? : A Little 6 Click Score: 20    End of Session Equipment Utilized During Treatment: Gait belt Activity Tolerance: Patient tolerated treatment well Patient left: in bed;with call bell/phone within reach Nurse Communication: Mobility status PT Visit Diagnosis: Unsteadiness on feet (R26.81);Other abnormalities of gait and mobility (R26.89);Pain Pain - Right/Left: Right Pain - part of body: Leg     Time: 1610-9604 PT Time Calculation (min) (ACUTE ONLY): 32 min  Charges:    $Gait Training: 8-22 mins $Therapeutic Activity: 8-22 mins PT General Charges $$ ACUTE PT VISIT: 1 Visit                     Rexie Catena, PT, DPT Acute Rehabilitation Office [phone]    Rexie Catena 06/25/2023, 5:55 PM

## 2023-06-25 NOTE — Plan of Care (Signed)
  Problem: Metabolic: Goal: Ability to maintain appropriate glucose levels will improve Outcome: Not Progressing   Problem: Skin Integrity: Goal: Risk for impaired skin integrity will decrease Outcome: Not Progressing   Problem: Tissue Perfusion: Goal: Adequacy of tissue perfusion will improve Outcome: Not Progressing   Problem: Clinical Measurements: Goal: Will remain free from infection Outcome: Not Progressing   Problem: Activity: Goal: Risk for activity intolerance will decrease Outcome: Not Progressing   Problem: Pain Managment: Goal: General experience of comfort will improve and/or be controlled Outcome: Not Progressing

## 2023-06-25 NOTE — Plan of Care (Signed)

## 2023-06-25 NOTE — Progress Notes (Addendum)
 [facility]    Date of Admission:  06/19/2023   Total days of antibiotics 7          ID: Roberta Soto is a 43 y.o. female with  group a strep SSTI to right leg and right knee prepatellar septic arthritis  Principal Problem:   Abscess of bursa of right knee Active Problems:   Sepsis  AMS  Hemodynamic instability   Chronic health problem   AMS (altered mental status)   Asymptomatic LV dysfunction   Benign hypertension   Class 3 severe obesity due to excess calories with serious comorbidity and body mass index (BMI) of 40.0 to 44.9 in adult   Sepsis due to Streptococcus pyogenes (HCC)   Cardiomyopathy (HCC)    Subjective: Afebrile. Less tenderness to left leg  Underwent right ankle CT that did not show septic arthritis  Medications:   acetaminophen   650 mg Oral Q6H   clonazePAM  0.5 mg Oral TID   vitamin B-12  1,000 mcg Oral Daily   enoxaparin  (LOVENOX ) injection  60 mg Subcutaneous Q24H   escitalopram  10 mg Oral Daily   fluticasone furoate-vilanterol  1 puff Inhalation Daily   furosemide  20 mg Intravenous Daily   gabapentin  400 mg Oral TID   insulin  aspart  0-5 Units Subcutaneous QHS   insulin  aspart  0-9 Units Subcutaneous TID WC   linezolid  600 mg Oral Q12H   losartan  25 mg Oral QPM   metoprolol succinate  25 mg Oral Daily   polyethylene glycol  17 g Oral Daily   senna-docusate  2 tablet Oral BID   sodium chloride  flush  10-40 mL Intracatheter Q12H    Objective: Vital signs in last 24 hours: Temp:  [97.6 F (36.4 C)-99.2 F (37.3 C)] 97.6 F (36.4 C) (05/14 0749) Pulse Rate:  [89-106] 89 (05/14 0830) Resp:  [18] 18 (05/14 0830) BP: (104-116)/(67-78) 104/73 (05/14 0749) SpO2:  [99 %-100 %] 100 % (05/14 0830) Physical Exam  Constitutional:  oriented to person, place, and time. appears well-developed and well-nourished. No distress.  HENT: [place]/AT, PERRLA, no scleral icterus Mouth/Throat: Oropharynx is clear and moist. No  oropharyngeal exudate.  Cardiovascular: Normal rate, regular rhythm and normal heart sounds. Exam reveals no gallop and no friction rub.  No murmur heard.  Pulmonary/Chest: Effort normal and breath sounds normal. No respiratory distress.  has no wheezes.  Neck = supple, no nuchal rigidity Abdominal: Soft. Bowel sounds are normal.  exhibits no distension. There is no tenderness.  ZOX:WRUEA knee moving without pain. Still swelling to dorsum of foot Psychiatric: a normal mood and affect.  behavior is normal.    Lab Results Recent Labs    06/24/23 0658 06/25/23 0312  WBC 18.2* 12.6*  HGB 8.0* 7.6*  HCT 28.8* 27.7*  NA 137 138  K 3.7 3.4*  CL 106 106  CO2 23 24  BUN 8 9  CREATININE 0.94 0.98   Liver Panel No results for input(s): "PROT", "ALBUMIN", "AST", "ALT", "ALKPHOS", "BILITOT", "BILIDIR", "IBILI" in the last 72 hours. Sedimentation Rate No results for input(s): "ESRSEDRATE" in the last 72 hours. C-Reactive Protein Recent Labs    06/24/23 0658 06/25/23 0312  CRP 15.8* 13.5*    Microbiology: reviewed Studies/Results: CT ANKLE RIGHT W CONTRAST Result Date: 06/25/2023 CLINICAL DATA:  Right ankle swelling and tenderness. History of bacteremia. EXAM: CT OF THE RIGHT ANKLE WITH CONTRAST TECHNIQUE: Multidetector CT imaging of the right ankle was performed  following the standard protocol during bolus administration of intravenous contrast. RADIATION DOSE REDUCTION: This exam was performed according to the departmental dose-optimization program which includes automated exposure control, adjustment of the mA and/or kV according to patient size and/or use of iterative reconstruction technique. CONTRAST:  75mL OMNIPAQUE IOHEXOL 350 MG/ML SOLN COMPARISON:  None Available. FINDINGS: Bones/Joint/Cartilage No fracture or dislocation. Normal alignment. Mild joint space narrowing with dorsal spurring at the talonavicular articulation. Ankle mortise is congruent. Calcaneal enthesopathy at the  insertion of the Achilles tendon. Tiny plantar calcaneal spur. Ligaments Ligaments are suboptimally evaluated by CT. Muscles and Tendons Muscles are normal. No muscle atrophy. No intramuscular fluid collection or hematoma. Soft tissue Generalized nonspecific circumferential subcutaneous edema of the visualized right lower extremity extending through the ankle and into the dorsal foot. No loculated fluid collection. No soft tissue gas. IMPRESSION: 1. Generalized nonspecific circumferential subcutaneous edema of the visualized right lower extremity extending through the ankle and into the dorsal foot, concerning for cellulitis in the appropriate setting. No loculated fluid collection. No soft tissue gas. 2. No acute osseous abnormality. 3. Mild degenerative changes at the talonavicular joint. Electronically Signed   By: Mannie Seek M.D.   On: 06/25/2023 13:27   VAS US  LOWER EXTREMITY VENOUS (DVT) Result Date: 06/24/2023  Lower Venous DVT Study Patient Name:  Roberta Soto  Date of Exam:   06/24/2023 Medical Rec #: [id number]      Accession #:    [id number] Date of Birth: 1980-10-19      Patient Gender: F Patient Age:   83 years Exam Location:  [facility] Procedure:      VAS US  LOWER EXTREMITY VENOUS (DVT) Referring Phys: Dorthy Gavia --------------------------------------------------------------------------------  Indications: Pain.  Limitations: Body habitus and poor ultrasound/tissue interface. Performing Technologist: Aloma Arrant RVS  Examination Guidelines: A complete evaluation includes B-mode imaging, spectral Doppler, color Doppler, and power Doppler as needed of all accessible portions of each vessel. Bilateral testing is considered an integral part of a complete examination. Limited examinations for reoccurring indications may be performed as noted. The reflux portion of the exam is performed with the patient in reverse Trendelenburg.   +--------+---------------+---------+-----------+----------+--------------------+ RIGHT   CompressibilityPhasicitySpontaneityPropertiesThrombus Aging       +--------+---------------+---------+-----------+----------+--------------------+ CFV     Full           Yes      Yes                                       +--------+---------------+---------+-----------+----------+--------------------+ SFJ     Full                                                              +--------+---------------+---------+-----------+----------+--------------------+ FV Prox Full                                                              +--------+---------------+---------+-----------+----------+--------------------+ FV Mid  Full                                                              +--------+---------------+---------+-----------+----------+--------------------+  FV                     Yes      Yes                  patent by color      Distal                                               doppler              +--------+---------------+---------+-----------+----------+--------------------+ PFV     Full                                                              +--------+---------------+---------+-----------+----------+--------------------+ POP     Full           Yes      Yes                                       +--------+---------------+---------+-----------+----------+--------------------+ PTV     Full                                                              +--------+---------------+---------+-----------+----------+--------------------+ PERO    Full                                                              +--------+---------------+---------+-----------+----------+--------------------+   +----+---------------+---------+-----------+----------+--------------+ LEFTCompressibilityPhasicitySpontaneityPropertiesThrombus Aging  +----+---------------+---------+-----------+----------+--------------+ CFV Full           Yes      Yes                                 +----+---------------+---------+-----------+----------+--------------+     Summary: RIGHT: - There is no evidence of deep vein thrombosis in the lower extremity.  - No cystic structure found in the popliteal fossa.  LEFT: - No evidence of common femoral vein obstruction.   *See table(s) above for measurements and observations. Electronically signed by Delaney Fearing on 06/24/2023 at 4:07:21 PM.    Final    CT FEMUR RIGHT W CONTRAST Result Date: 06/24/2023 CLINICAL DATA:  Status post incision and drainage of the right knee prepatellar bursa 06/20/2023. Presenting with pain bacteremia. EXAM: CT OF THE LOWER RIGHT EXTREMITY WITH CONTRAST TECHNIQUE: Multidetector CT imaging of the lower right extremity was performed according to the standard protocol following intravenous contrast administration. RADIATION DOSE REDUCTION: This exam was performed according to the departmental dose-optimization program which includes automated exposure control, adjustment of the mA and/or kV according to patient size and/or use of iterative reconstruction technique. CONTRAST:  OMNIPAQUE IOHEXOL 350  MG/ML SOLN COMPARISON:  06/19/2023. FINDINGS: Bones/Joint/Cartilage No acute fracture or dislocation. Small knee joint effusion. Mild medial femorotibial compartment joint space narrowing. Mild degenerative changes of the right hip. Ligaments Ligaments are suboptimally evaluated by CT. Muscles and Tendons Muscles are normal. No muscle atrophy. No intramuscular fluid collection. Soft tissue Status post incision and drainage of the right knee prepatellar bursa with ill-defined region of hypodense fluid in the prepatellar soft tissues measuring approximately 3.3 x 1.7 cm in axial dimension and 4.1 cm in craniocaudal dimension (series 3, image 255 and series 8, image 66). Foci of soft tissue air along  the superficial margin of the collection, just below the level of the skin surface. There is surrounding stranding and subcutaneous edema with overlying cutaneous thickening. Nonspecific generalized subcutaneous edema and cutaneous thickening of the visualized right lower extremity. IMPRESSION: 1. Status post incision and drainage of the right knee prepatellar bursa with ill-defined region of hypodense fluid of indeterminate sterility in the prepatellar soft tissues measuring approximately 3.3 x 1.7 x 4.1 cm with foci of soft tissue air along the superficial margin of the collection, just below the level of the skin surface. Organizing phlegmon/early abscess formation can not be excluded. Surrounding subcutaneous edema and cutaneous thickening is concerning for cellulitis. 2. Small right knee joint effusion. Electronically Signed   By: Mannie Seek M.D.   On: 06/24/2023 12:17    Assessment/Plan: Group a strep septic prepatellar bursitis +/- septic arthritis, with soft tissue involvement to thigh and possible lower leg  = currently on day 7 of iv abtx and linezolid. Starting to finally improve. Less pain. Leukocytosis improved. Plan to treat for 3 additional weeks with linezolid 600mg  po bid  Abtx side effect - recommend for follow up cbc at conclusion of abtx course  Right knee septic prepatellar bursitis = to follow up with dr Christiane Cowing  Will sign off  I have personally spent 50 minutes involved in face-to-face and non-face-to-face activities for this patient on the day of the visit. Professional time spent includes the following activities: Preparing to see the patient (review of tests),  communicating with hospitalist and orthopedic surgeion Documenting clinical information in the EMR, Independently interpreting results (not separately reported), Communicating results to the patient.   [facility] Pager: [phone]  06/25/2023, 1:53 PM

## 2023-06-25 NOTE — Progress Notes (Signed)
 Rounding Note    Patient Name: Roberta Soto Date of Encounter: 06/25/2023  [facility] Cardiologist: Olinda Bertrand, DO  Chief Complaint:  Chief Complaint  Patient presents with   Altered Mental Status  Reason of consult: New onset LV dysfunction   Subjective   Sitting up in chair. Denies anginal chest pain. Lower extremity swelling improving, right ankle though appears to be more swollen   Inpatient Medications    Scheduled Meds:  acetaminophen   650 mg Oral Q6H   clonazePAM  0.5 mg Oral TID   vitamin B-12  1,000 mcg Oral Daily   enoxaparin  (LOVENOX ) injection  60 mg Subcutaneous Q24H   escitalopram  10 mg Oral Daily   fluticasone furoate-vilanterol  1 puff Inhalation Daily   furosemide  20 mg Intravenous Daily   gabapentin  400 mg Oral TID   insulin  aspart  0-5 Units Subcutaneous QHS   insulin  aspart  0-9 Units Subcutaneous TID WC   linezolid  600 mg Oral Q12H   losartan  25 mg Oral QPM   metoprolol succinate  25 mg Oral Daily   polyethylene glycol  17 g Oral Daily   senna-docusate  2 tablet Oral BID   sodium chloride  flush  10-40 mL Intracatheter Q12H   Continuous Infusions:  cefTRIAXone  (ROCEPHIN )  IV Stopped (06/25/23 0614)   PRN Meds: cyclobenzaprine, HYDROmorphone  (DILAUDID ) injection, ipratropium-albuterol , lip balm, oxyCODONE, sodium chloride  flush   Vital Signs    Vitals:   06/24/23 1937 06/25/23 0422 06/25/23 0749 06/25/23 0830  BP: 109/76 105/67 104/73   Pulse: (!) 106 96 89 89  Resp: 18  18 18   Temp: 99.2 F (37.3 C) 98.1 F (36.7 C) 97.6 F (36.4 C)   TempSrc: Oral Oral Oral   SpO2: 99% 100% 100% 100%  Weight:      Height:        Intake/Output Summary (Last 24 hours) at 06/25/2023 1021 Last data filed at 06/25/2023 0830 Gross per 24 hour  Intake 897 ml  Output 500 ml  Net 397 ml       06/23/2023    7:14 AM 06/21/2023    5:00 AM 06/20/2023    5:00 AM  Last 3 Weights  Weight (lbs) 292 lb 8.8 oz 282 lb 3 oz 297 lb 2.9 oz   Weight (kg) 132.7 kg 128 kg 134.8 kg     Telemetry    Not on telemetry- Personally Reviewed  ECG     No new tracing - Personally Reviewed  Physical Exam   Physical Exam  Constitutional: No distress.  hemodynamically stable  Neck: No JVD present.  Cardiovascular: Normal rate, regular rhythm, S1 normal and S2 normal. Exam reveals no gallop, no S3 and no S4.  No murmur heard. Pulmonary/Chest: Effort normal and breath sounds normal. No stridor. She has no wheezes. She has no rales.  Musculoskeletal:        General: Edema (Right lower extremity swollen 1+ up to the thighs, Ace wraps present) present.     Cervical back: Neck supple.  Neurological: She is alert and oriented to person, place, and time.  Skin: Skin is warm and moist.  Tattoos noted    Labs    High Sensitivity Troponin:  No results for input(s): "TROPONINIHS" in the last 720 hours.   Chemistry Recent Labs  Lab  0000 06/19/23 1348 06/19/23 1349 06/19/23 1402 06/20/23 0230 06/22/23 1031 06/23/23 0508 06/24/23 0658 06/25/23 0312  NA  --   --  142   < >  141   < > 139 137 138  K  --   --  3.3*   < > 3.6   < > 3.5 3.7 3.4*  CL   < >  --  107  --  108   < > 106 106 106  CO2   < >  --  24  --  20*   < > 23 23 24   GLUCOSE   < >  --  131*  --  94   < > 94 89 104*  BUN   < >  --  14  --  14   < > 9 8 9   CREATININE  --   --  1.51*   < > 1.53*   < > 0.96 0.94 0.98  CALCIUM    < >  --  8.1*  --  7.7*   < > 8.2* 8.3* 8.2*  MG  --  1.8  --   --  1.5*  --  1.9  --   --   PROT  --   --  6.1*  --  5.4*  --   --   --   --   ALBUMIN  --   --  3.4*  --  2.8*  --   --   --   --   AST  --   --  47*  --  36  --   --   --   --   ALT  --   --  48*  --  40  --   --   --   --   ALKPHOS  --   --  70  --  63  --   --   --   --   BILITOT  --   --  1.1  --  1.2  --   --   --   --   GFRNONAA  --   --  44*   < > 44*   < > >60 >60 >60  ANIONGAP   < >  --  11  --  13   < > 10 8 8    < > = values in this interval not displayed.     Lipids  Recent Labs  Lab 06/24/23 0658  CHOL 119  TRIG 125  HDL 18*  LDLCALC 76  CHOLHDL 6.6    Hematology Recent Labs  Lab 06/23/23 0508 06/24/23 0658 06/25/23 0312  WBC 21.3* 18.2* 12.6*  RBC 4.06 4.21 3.94  HGB 7.9* 8.0* 7.6*  HCT 27.7* 28.8* 27.7*  MCV 68.2* 68.4* 70.3*  MCH 19.5* 19.0* 19.3*  MCHC 28.5* 27.8* 27.4*  RDW 18.6* 18.7* 18.6*  PLT 402* 458* 472*   Thyroid No results for input(s): "TSH", "FREET4" in the last 168 hours.  BNPNo results for input(s): "BNP", "PROBNP" in the last 168 hours.  DDimer No results for input(s): "DDIMER" in the last 168 hours.   Radiology    NA  Cardiac Studies   Echo  06/20/2023  1. Left ventricular ejection fraction, by estimation, is 30 to 35%. The  left ventricle has moderately decreased function. The left ventricle  demonstrates global hypokinesis. The left ventricular internal cavity size  was moderately dilated. Left  ventricular diastolic parameters were normal.   2. Right ventricular systolic function is normal. The right ventricular  size is normal. There is normal pulmonary artery systolic pressure.   3. Left atrial size was moderately dilated.   4. The  mitral valve is abnormal. Mild mitral valve regurgitation. No  evidence of mitral stenosis.   5. The aortic valve is tricuspid. Aortic valve regurgitation is not  visualized. No aortic stenosis is present.   6. The inferior vena cava is normal in size with greater than 50%  respiratory variability, suggesting right atrial pressure of 3 mmHg.   Patient Profile     43 y.o. female with PMH significant for asthma and HTN who presented to the ED with confusion and R knee pain.  Patient was noted to be quite agitated and tachycardic.  She was initially admitted to the ICU.  There was concern for right knee infection. Blood culture from 5/8 is growing strep viridans. Strep pyogenes also noted in right knee aspirate as well as aspirate from the prepatellar bursa. Echo  performed which noted reduced LVEF and cardiology consulted.   Assessment & Plan    Cardiomyopathy, etiology unspecified EF noted to be 30 to 35%.  Repeat echo as outpatient in 3months to re-evaluate EF in the interim continue to uptitrate GDMT as hemodynamics and laboratory values allow. Etiologies include but not limited to stress-induced cardiomyopathy (severe infection/sepsis), hypertensive heart disease, etc. Swelling and volume size improved with diuretic therapy. Continue Lasix 20 mg IV push daily, transition to p.o. Lasix at time of discharge Continue losartan 25 mg p.o. daily.   BP is soft and therefore we will hold off on up titration of GDMT at this time.  If blood pressure improves consider addition of spironolactone 12.5 mg p.o. every morning.  Will need follow-up labs in 1 week to evaluate renal function and potassium upon initiation of spironolactone. Has had urinary tract infections in the past, will hold off on SGLT2 inhibitors at this time. Continue Toprol-XL 25 mg p.o. daily  Strict I's and O's and daily weights -still not accurate If LVEF does not recover would benefit from ischemic evaluation given the degree of cardiomyopathy. A1c and fasting lipids reviewed  Right Leg Swelling : Duplex results reviewed.   Strep pyogenes bacteremia/right leg cellulitis/right knee bursitis/sepsis present on admission  Currently on antibiotics  ID following.   HTN: medications as discuss above.    AKI resolved.   Obesity due to excess calorie:  Body mass index is 45.82 kg/m. I reviewed with her importance of diet, regular physical activity/exercise, weight loss.   Patient is educated on the importance of increasing physical activity gradually as tolerated with a goal of moderate intensity exercise for 30 minutes a day 5 days a week.  Remainder of evaluation and management per primary team.  Cardiology will sign off please reach out if any questions or concerns arise in the  interim. Transition IV Lasix to p.o. at closer to discharge. Continue losartan 25 mg p.o. every afternoon. Continue Toprol-XL 25 mg p.o. every morning Would recommend following up with Pharm.D. to uptitrate GDMT as outpatient. Repeat echo in 3 months once optimized on GDMT and follow-up appointment with myself..  For questions or updates, please contact [facility] Please consult [email or web address] for contact info under     Signed, Olinda Bertrand, DO, [facility]  [facility]  Pager: [phone] Office: [phone] 06/25/2023,

## 2023-06-25 NOTE — Progress Notes (Signed)
    Durable Medical Equipment  (From admission, onward)           Start     Ordered   06/25/23 1056  For home use only DME Walker rolling  Once       Question Answer Comment  Walker: With 5 Inch Wheels   Patient needs a walker to treat with the following condition Gait instability      06/25/23 1056   06/25/23 1056  For home use only DME Bedside commode  Once       Comments: Confine to one room  Question:  Patient needs a bedside commode to treat with the following condition  Answer:  Gait instability   06/25/23 1056

## 2023-06-25 NOTE — Telephone Encounter (Signed)
 Patient Product/process development scientist completed.    The patient is insured through [facility] [place].     Ran test claim for linezolid 600 mg and the current 21 day co-pay is $4.00.   This test claim was processed through [facility]- copay amounts may vary at other pharmacies due to pharmacy/plan contracts, or as the patient moves through the different stages of their insurance plan.     Morgan Arab, CPHT Pharmacy Technician III Certified Patient Advocate [facility] Patient Advocate Team Direct Number: [phone]  Fax: [phone]

## 2023-06-25 NOTE — Progress Notes (Signed)
 Subjective: 5 Days Post-Op Procedure(s) (LRB): IRRIGATION AND DEBRIDEMENT RIGHT KNEE (Right) Patient reports pain as mild.  Feeling so much better today.  Only complaining of heaviness to the right leg. Still awaiting ct scan right ankle   Objective: Vital signs in last 24 hours: Temp:  [97.6 F (36.4 C)-99.2 F (37.3 C)] 97.6 F (36.4 C) (05/14 0749) Pulse Rate:  [89-106] 89 (05/14 0830) Resp:  [18] 18 (05/14 0830) BP: (104-116)/(67-78) 104/73 (05/14 0749) SpO2:  [99 %-100 %] 100 % (05/14 0830)  Intake/Output from previous day: 05/13 0701 - 05/14 0700 In: 357 [P.O.:357] Out: 300 [Urine:300] Intake/Output this shift: No intake/output data recorded.  Recent Labs    06/22/23 1031 06/23/23 0508 06/24/23 0658 06/25/23 0312  HGB 7.9* 7.9* 8.0* 7.6*   Recent Labs    06/24/23 0658 06/25/23 0312  WBC 18.2* 12.6*  RBC 4.21 3.94  HCT 28.8* 27.7*  PLT 458* 472*   Recent Labs    06/24/23 0658 06/25/23 0312  NA 137 138  K 3.7 3.4*  CL 106 106  CO2 23 24  BUN 8 9  CREATININE 0.94 0.98  GLUCOSE 89 104*  CALCIUM  8.3* 8.2*   No results for input(s): "LABPT", "INR" in the last 72 hours.  Physical Exam RLE- entire leg from thigh to ankle is still moderately swollen and erythematous.  Some induration to right lateral thigh.  Little to no pain with knee and ankle ROM.  Unable to express any fluid from the right knee wound.     Assessment/Plan: 5 Days Post-Op Procedure(s) (LRB): IRRIGATION AND DEBRIDEMENT RIGHT KNEE (Right) Up with therapy WBAT RLE Continue abx per ID Awaiting CT scan right ankle WBC has significantly trended down Clinically she has significantly improved.  I do not feel that she has a septic right knee or ankle joint.        Sandie Cross 06/25/2023, 8:37 AM

## 2023-06-25 NOTE — Progress Notes (Signed)
   Heart Failure Stewardship Pharmacist Progress Note   PCP: Patient, No Pcp Per PCP-Cardiologist: Sunit Tolia, DO    HPI:  43 yo F with PMH of asthma and HTN.  Presented to the ED on 5/8 with AMS and knee pain from a fall 1 month prior. She was febrile, lactic acid >2, tachycardic, and R knee looked infectious appearing. Admitted for sepsis due to cellulitis vs septic arthritis. Ortho consulted and taken to the OR on 5/9 for exploration. Blood cultures positive for strep pyogenes. ECHO 5/9 showed LVEF 30-35%, global hypokinesis, RV normal, mild MR, no vegetations. Cardiology was consulted. LV dysfunction thought to be more related to sepsis. Started on IV lasix (volume overloaded due to R knee interventions + third spacing) and GDMT with metoprolol and losartan.    Denies shortness of breath. States she is feeling a little better than yesterday. Still with significant LE edema on R leg due to injury. Her left leg does not have any swelling. Denies lightheadedness or dizziness. Reviewed changes to GDMT and goal of therapy. Agreeable to using [facility] TOC pharmacy on discharge but prefers to stay with [facility] for refills.   Current HF Medications: Diuretic: furosemide 20 mg IV daily Beta Blocker: metoprolol XL 25 mg daily ACE/ARB/ARNI: losartan 25 mg nightly  Prior to admission HF Medications: None  Pertinent Lab Values: Serum creatinine 0.98, BUN 9, Potassium 3.4, Sodium 138, Magnesium  1.9, A1c 5.5   Vital Signs: Weight: 292 lbs (admission weight: 297 lbs) Blood pressure: 100/70s  Heart rate: 80-90s  I/O: incomplete  Medication Assistance / Insurance Benefits Check: Does the patient have prescription insurance?  Yes Type of insurance plan: [place] Medicaid  Outpatient Pharmacy:  [facility] Is the patient willing to use [facility] TOC pharmacy at discharge? Yes Is the patient willing to transition their outpatient pharmacy to utilize a [facility] outpatient pharmacy?    No    Assessment: 1. 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 NYHA class II symptoms. - Continue furosemide 20 mg IV daily. Strict I/Os and daily weights. Keep K>4 and Mg>2. - Continue metoprolol XL 25 mg daily, HRs have improved from yesterday - Continue losartan 25 mg daily - Consider adding spironolactone tomorrow if BP stable   Plan: 1) Medication changes recommended at this time: - Add spironolactone 12.5 mg daily  2) Patient assistance: - None pending  3)  Education  - Patient has been educated on current HF medications and potential additions to HF medication regimen - Patient verbalizes understanding that over the next few months, these medication doses may change and more medications may be added to optimize HF regimen - Patient has been educated on basic disease state pathophysiology and goals of therapy   Jerilyn Monte, PharmD, BCPS Heart Failure Stewardship Pharmacist Phone [phone]

## 2023-06-26 DIAGNOSIS — M71061 Abscess of bursa, right knee: Secondary | ICD-10-CM | POA: Diagnosis not present

## 2023-06-26 LAB — GLUCOSE, CAPILLARY
Glucose-Capillary: 100 mg/dL — ABNORMAL HIGH (ref 70–99)
Glucose-Capillary: 107 mg/dL — ABNORMAL HIGH (ref 70–99)
Glucose-Capillary: 132 mg/dL — ABNORMAL HIGH (ref 70–99)
Glucose-Capillary: 103 mg/dL — ABNORMAL HIGH (ref 70–99)

## 2023-06-26 LAB — BASIC METABOLIC PANEL WITH GFR
Anion gap: 10 (ref 5–15)
BUN: 6 mg/dL (ref 6–20)
CO2: 23 mmol/L (ref 22–32)
Calcium: 8.2 mg/dL — ABNORMAL LOW (ref 8.9–10.3)
Chloride: 106 mmol/L (ref 98–111)
Creatinine, Ser: 0.93 mg/dL (ref 0.44–1.00)
GFR, Estimated: >60 mL/min (ref >60)
Glucose, Bld: 112 mg/dL — ABNORMAL HIGH (ref 70–99)
Potassium: 3.6 mmol/L (ref 3.5–5.1)
Sodium: 139 mmol/L (ref 135–145)

## 2023-06-26 LAB — CULTURE, BLOOD (ROUTINE X 2)
Culture: NO GROWTH
Culture: NO GROWTH

## 2023-06-26 LAB — CBC
HCT: 28.4 % — ABNORMAL LOW (ref 36.0–46.0)
Hemoglobin: 7.9 g/dL — ABNORMAL LOW (ref 12.0–15.0)
MCH: 19.5 pg — ABNORMAL LOW (ref 26.0–34.0)
MCHC: 27.8 g/dL — ABNORMAL LOW (ref 30.0–36.0)
MCV: 70 fL — ABNORMAL LOW (ref 80.0–100.0)
Platelets: 523 10*3/uL — ABNORMAL HIGH (ref 150–400)
RBC: 4.06 MIL/uL (ref 3.87–5.11)
RDW: 18.7 % — ABNORMAL HIGH (ref 11.5–15.5)
WBC: 9.1 10*3/uL (ref 4.0–10.5)
nRBC: 0.4 % — ABNORMAL HIGH (ref 0.0–0.2)

## 2023-06-26 MED ORDER — POTASSIUM CHLORIDE CRYS ER 20 MEQ PO TBCR
40.0000 meq | EXTENDED_RELEASE_TABLET | Freq: Once | ORAL | Status: AC
  Administered 2023-06-26: 40 meq via ORAL
  Filled 2023-06-26: qty 2

## 2023-06-26 MED ORDER — SPIRONOLACTONE 12.5 MG HALF TABLET
12.5000 mg | ORAL_TABLET | Freq: Every day | ORAL | Status: DC
  Administered 2023-06-26 – 2023-06-29 (×4): 12.5 mg via ORAL
  Filled 2023-06-26 (×4): qty 1

## 2023-06-26 NOTE — Progress Notes (Signed)
 Mobility Specialist Progress Note:    06/26/23 1000  Mobility  Activity Ambulated with assistance in hallway  Level of Assistance Standby assist, set-up cues, supervision of patient - no hands on  Assistive Device Front wheel walker  Distance Ambulated (ft) 200 ft  RLE Weight Bearing Per Provider Order WBAT  Activity Response Tolerated well  Mobility Referral Yes  Mobility visit 1 Mobility  Mobility Specialist Start Time (ACUTE ONLY) [id number]  Mobility Specialist Stop Time (ACUTE ONLY) 0946  Mobility Specialist Time Calculation (min) (ACUTE ONLY) 41 min   Received pt in bed having no complaints and agreeable to mobility. Pt was asymptomatic throughout ambulation and returned to room w/o fault. Left on EOB w/ call bell in reach and all needs met.   D'Vante Nolon Baxter Mobility Specialist Please contact via Special educational needs teacher or Rehab office at [phone]

## 2023-06-26 NOTE — Progress Notes (Signed)
   Heart Failure Stewardship Pharmacist Progress Note   PCP: Patient, No Pcp Per PCP-Cardiologist: Sunit Tolia, DO    HPI:  43 yo F with PMH of asthma and HTN.  Presented to the ED on 5/8 with AMS and knee pain from a fall 1 month prior. She was febrile, lactic acid >2, tachycardic, and R knee looked infectious appearing. Admitted for sepsis due to cellulitis vs septic arthritis. Ortho consulted and taken to the OR on 5/9 for exploration. Blood cultures positive for strep pyogenes. ECHO 5/9 showed LVEF 30-35%, global hypokinesis, RV normal, mild MR, no vegetations. Cardiology was consulted. LV dysfunction thought to be more related to sepsis. Started on IV lasix (volume overloaded due to R knee interventions + third spacing) and GDMT with metoprolol and losartan.    Denies shortness of breath. Still with LE edema on R leg due to injury. Her left leg does not have any swelling. Denies lightheadedness or dizziness.   Current HF Medications: Diuretic: furosemide 20 mg IV daily Beta Blocker: metoprolol XL 25 mg daily ACE/ARB/ARNI: losartan 25 mg nightly  Prior to admission HF Medications: None  Pertinent Lab Values: Serum creatinine 0.93, BUN 6, Potassium 3.6, Sodium 139, Magnesium  1.9, A1c 5.5   Vital Signs: Weight: 293 lbs (admission weight: 297 lbs) Blood pressure: 110/70s  Heart rate: 90s  I/O: incomplete  Medication Assistance / Insurance Benefits Check: Does the patient have prescription insurance?  Yes Type of insurance plan: [place] Medicaid  Outpatient Pharmacy:  [facility] Is the patient willing to use [facility] TOC pharmacy at discharge? Yes Is the patient willing to transition their outpatient pharmacy to utilize a [facility] outpatient pharmacy?   No    Assessment: 1. 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 NYHA class II symptoms. - Continue furosemide 20 mg IV daily. Strict I/Os and daily weights. Keep K>4  and Mg>2. - Continue metoprolol XL 25 mg daily - Continue losartan 25 mg daily - Consider adding spironolactone 12.5 mg daily and monitor BP   Plan: 1) Medication changes recommended at this time: - Add spironolactone 12.5 mg daily  2) Patient assistance: - None pending  3)  Education  - Patient has been educated on current HF medications and potential additions to HF medication regimen - Patient verbalizes understanding that over the next few months, these medication doses may change and more medications may be added to optimize HF regimen - Patient has been educated on basic disease state pathophysiology and goals of therapy   Jerilyn Monte, PharmD, BCPS Heart Failure Stewardship Pharmacist Phone [phone]

## 2023-06-26 NOTE — Progress Notes (Signed)
 Peripherally reviewed the patient's chart:  Vital signs are stable Temp:  [97.5 F (36.4 C)-99.1 F (37.3 C)] 98.1 F (36.7 C) (05/15 0717) Pulse Rate:  [94-100] 94 (05/15 0717) Resp:  [16-18] 17 (05/15 0717) BP: (109-125)/(71-102) 109/71 (05/15 0717) SpO2:  [98 %-100 %] 98 % (05/15 0901) Weight:  [133.3 kg] 133.3 kg (05/15 0257)  Labs 06/26/2023 reviewed-renal function and potassium levels are within normal limits.  Follow-up appointment scheduled.  Please reach out if any questions or concerns arise in the interim  No charge  Olinda Bertrand, DO, [facility]  [facility]

## 2023-06-26 NOTE — TOC Progression Note (Addendum)
 Transition of Care [facility]) - Progression Note    Patient Details  Name: Roberta Soto MRN: [id number] Date of Birth: 1980-07-18  Transition of Care [facility]) CM/SW Contact  Alisa App, RN Phone Number: 06/26/2023, 9:36 AM  Clinical Narrative:    NCM spoke with pt @ bedside regarding PT's recommendation for HHPT and DME needs. Pt agreeable to both. Referral made with [facility] and accepted for HHPT only , pt with Medicaid. HHPT order will be needed from MD. Pt lives in a hotel - [address] [place], [place] [address]. Referral made with [facility] for DME , RW. Equipment will be delivered to bedside prior to d/c.   06/26/2023 101:45 am Housing resources provided to pt by CSW.  TOC team  following and will continue assisting with needs        Expected Discharge Plan and Services   Discharge Planning Services: CM Consult                     DME Arranged: Bedside commode, Walker rolling DME Agency: [facility] Date DME Agency Contacted: 06/25/23 Time DME Agency Contacted: 1100 Representative spoke with at DME Agency: Gladys Lamp HH Arranged: PT HH Agency: [facility] Date HH Agency Contacted: 06/25/23 Time HH Agency Contacted: 1059 Representative spoke with at [facility] Agency: Renetta Carter   Social Determinants of Health (SDOH) Interventions SDOH Screenings   Food Insecurity: No Food Insecurity (06/24/2023)  Housing: Unknown (06/25/2023)  Transportation Needs: No Transportation Needs (06/25/2023)  Utilities: Not At Risk (06/25/2023)  Tobacco Use: Medium Risk (06/20/2023)    Readmission Risk Interventions     No data to display

## 2023-06-26 NOTE — Plan of Care (Signed)
  Problem: Skin Integrity: Goal: Risk for impaired skin integrity will decrease Outcome: Not Progressing   Problem: Metabolic: Goal: Ability to maintain appropriate glucose levels will improve Outcome: Not Progressing   Problem: Activity: Goal: Risk for activity intolerance will decrease Outcome: Not Progressing    Problem: Pain Managment: Goal: General experience of comfort will improve and/or be controlled Outcome: Not Progressing

## 2023-06-26 NOTE — Progress Notes (Signed)
 Subjective: 6 Days Post-Op Procedure(s) (LRB): IRRIGATION AND DEBRIDEMENT RIGHT KNEE (Right) Patient reports pain as mild.  Continuing to feel much better  Objective: Vital signs in last 24 hours: Temp:  [97.5 F (36.4 C)-99.1 F (37.3 C)] 98.1 F (36.7 C) (05/15 0717) Pulse Rate:  [89-100] 94 (05/15 0717) Resp:  [16-18] 17 (05/15 0717) BP: (104-125)/(71-102) 109/71 (05/15 0717) SpO2:  [99 %-100 %] 100 % (05/15 0717) Weight:  [133.3 kg] 133.3 kg (05/15 0257)  Intake/Output from previous day: 05/14 0701 - 05/15 0700 In: 780 [P.O.:480; IV Piggyback:300] Out: 600 [Urine:600] Intake/Output this shift: No intake/output data recorded.  Recent Labs    06/24/23 0658 06/25/23 0312 06/26/23 0500  HGB 8.0* 7.6* 7.9*   Recent Labs    06/25/23 0312 06/26/23 0500  WBC 12.6* 9.1  RBC 3.94 4.06  HCT 27.7* 28.4*  PLT 472* 523*   Recent Labs    06/25/23 0312 06/26/23 0500  NA 138 139  K 3.4* 3.6  CL 106 106  CO2 24 23  BUN 9 6  CREATININE 0.98 0.93  GLUCOSE 104* 112*  CALCIUM  8.2* 8.2*   No results for input(s): "LABPT", "INR" in the last 72 hours.  PE Persistent soft tissue swelling/erythema/warmth and induration from right thigh to right ankle.  Not able to express any drainage from the right knee wound.  Painless range of motion right knee and ankle   Assessment/Plan: 6 Days Post-Op Procedure(s) (LRB): IRRIGATION AND DEBRIDEMENT RIGHT KNEE (Right) Up with therapy WBAT RLE Continue wet-to-dry dressing changes bid to right knee Continue abx per ID I believe patient has cellulitis to the RLE consistent with the aggressive nature of group A strep, but I do no feel she has a septic right knee/ankle       Sandie Cross 06/26/2023, 7:43 AM

## 2023-06-26 NOTE — Progress Notes (Signed)
 Occupational Therapy Treatment Patient Details Name: Roberta Soto MRN: [id number] DOB: Jul 20, 1980 Today's Date: 06/26/2023   History of present illness Pt is a 43 y/o female admitted with R knee septic bursitis; S/p I&D R knee, WBAT, no ROM restrictions; recovery complicated by new, but asymptomatic LV dysfunction;  has a past medical history of Asthma and Hypertension.   OT comments   Pt progressing well towards goals. Pt reporting no concerns with self-care tasks upon d/c home. Session focused on education for home safety and edema reduction. While mobilizing, pt reporting preference for no AD, OT educated pt on safety with RW, and safety in home if not using AD. Shower transfer was demo'd, pt verbalizing understanding. Encouraged pt to maintain leg elevation and positioning to reduce edema. Pt progressing well, still recommend no follow up OT. Will continue to follow acutely.       If plan is discharge home, recommend the following:  A little help with walking and/or transfers;A little help with bathing/dressing/bathroom;Assistance with cooking/housework   Equipment Recommendations  Tub/shower seat;BSC/3in1       Precautions / Restrictions Precautions Precautions: Fall Recall of Precautions/Restrictions: Intact Restrictions Weight Bearing Restrictions Per Provider Order: Yes RLE Weight Bearing Per Provider Order: Weight bearing as tolerated       Mobility Bed Mobility Overal bed mobility: Modified Independent     General bed mobility comments: use of gait belt as leg lifter    Transfers Overall transfer level: Needs assistance Equipment used: Rolling walker (2 wheels) Transfers: Sit to/from Stand, Bed to chair/wheelchair/BSC Sit to Stand: Supervision     Step pivot transfers: Supervision     General transfer comment: S for safety with and without RW     Balance Overall balance assessment: Needs assistance Sitting-balance support: Feet supported, No upper  extremity supported Sitting balance-Leahy Scale: Normal     Standing balance support: No upper extremity supported, During functional activity Standing balance-Leahy Scale: Fair     ADL either performed or assessed with clinical judgement   ADL Overall ADL's : Needs assistance/impaired       Lower Body Dressing: Set up;Sitting/lateral leans Lower Body Dressing Details (indicate cue type and reason): Able to don socks EOB Toilet Transfer: Supervision/safety;Ambulation;Rolling walker (2 wheels)       Functional mobility during ADLs: Supervision/safety;Rolling walker (2 wheels) General ADL Comments: Education provided on shower transfer    Extremity/Trunk Assessment Upper Extremity Assessment Upper Extremity Assessment: Overall WFL for tasks assessed   Lower Extremity Assessment Lower Extremity Assessment: Defer to PT evaluation                 Communication Communication Communication: No apparent difficulties   Cognition Arousal: Alert Behavior During Therapy: WFL for tasks assessed/performed Cognition: No apparent impairments       Following commands: Intact        Cueing   Cueing Techniques: Verbal cues        General Comments Noted edema in R foot, educated pt on importance of elevation    Pertinent Vitals/ Pain       Pain Assessment Pain Assessment: Faces Faces Pain Scale: Hurts little more Pain Location: R knee Pain Descriptors / Indicators: Grimacing Pain Intervention(s): Monitored during session   Frequency  Min 2X/week        Progress Toward Goals  OT Goals(current goals can now be found in the care plan section)  Progress towards OT goals: Progressing toward goals  Acute Rehab OT Goals Patient Stated Goal: To go home  OT Goal Formulation: With patient Time For Goal Achievement: 07/08/23 Potential to Achieve Goals: Good ADL Goals Pt Will Perform Grooming: with modified independence;standing Pt Will Perform Lower Body Dressing:  with modified independence;sit to/from stand;with adaptive equipment Pt Will Transfer to Toilet: with modified independence;ambulating Additional ADL Goal #1: Pt will complete ADL item retrieval and ADL task completion with independence. Additional ADL Goal #2: Pt will verbalize 3 fall prevention techniques to optimzie safety upon dc home.  Plan         AM-PAC OT "6 Clicks" Daily Activity     Outcome Measure   Help from another person eating meals?: None Help from another person taking care of personal grooming?: A Little Help from another person toileting, which includes using toliet, bedpan, or urinal?: A Little Help from another person bathing (including washing, rinsing, drying)?: A Little Help from another person to put on and taking off regular upper body clothing?: A Little Help from another person to put on and taking off regular lower body clothing?: A Little 6 Click Score: 19    End of Session Equipment Utilized During Treatment: Rolling walker (2 wheels);Gait belt  OT Visit Diagnosis: Other abnormalities of gait and mobility (R26.89);Pain Pain - Right/Left: Right Pain - part of body: Knee;Leg   Activity Tolerance Patient tolerated treatment well   Patient Left in bed;with call bell/phone within reach   Nurse Communication Mobility status        Time: 1610-9604 OT Time Calculation (min): 18 min  Charges: OT General Charges $OT Visit: 1 Visit OT Treatments $Self Care/Home Management : 8-22 mins  Delmer Ferraris, OT  Acute Rehabilitation Services Office [phone] Secure chat preferred   Mickael Alamo 06/26/2023, 4:52 PM

## 2023-06-26 NOTE — Progress Notes (Signed)
 PROGRESS NOTE    Roberta Soto  ZOX:[id number] DOB: 05/14/1980 DOA: 06/19/2023 PCP: Patient, No Pcp Per   Brief Narrative: 43 y.o. F with PMH significant for asthma and HTN who presented to the ED with confusion and R knee pain. Patient was noted to be quite agitated and tachycardic. She was initially admitted to the ICU. There was concern for right knee infection. Orthopedics was consulted.  Patient has been seen in consultation by PCCM Ortho infectious disease and cardiology.  Assessment & Plan:   Principal Problem:   Abscess of bursa of right knee Active Problems:   Sepsis  AMS  Hemodynamic instability   Chronic health problem   AMS (altered mental status)   Asymptomatic LV dysfunction   Benign hypertension   Class 3 severe obesity due to excess calories with serious comorbidity and body mass index (BMI) of 40.0 to 44.9 in adult   Sepsis due to Streptococcus pyogenes (HCC)   Cardiomyopathy (HCC)   Strep pyogenes bacteremia/right leg cellulitis/right knee bursitis/sepsis present on admission status post irrigation and debridement of the right knee Patient had sepsis physiology at admission.   Blood culture from 5/8 is growing strep viridans.  Strep pyogenes also noted in right knee aspirate as well as aspirate from the prepatellar bursa. Repeat blood culture sent on 5/10 without any growth so far. Infectious diseases following.  Orthopedics is following.  Patient initially was also on Rocephin  and Zyvox was added by ID with improvement in leukocytosis and lower extremity erythema and tenderness and swelling.  Rocephin  stopped on May 15.  ID recommends to continue Zyvox for an additional 3 weeks of Zyvox 600 mg p.o. twice daily. CT Right ankle- Generalized nonspecific circumferential subcutaneous edema of the visualized right lower extremity extending through the ankle and into the dorsal foot, concerning for cellulitis in the appropriate setting. No loculated fluid collection. No  soft tissue gas.No acute osseous abnormality. Mild degenerative changes at the talonavicular joint.  Hopefully discharge home tomorrow on p.o. antibiotics if okay with Ortho.  CT of the right lower extremity - Status post incision and drainage of the right knee prepatellar bursa with ill-defined region of hypodense fluid of indeterminate sterility in the prepatellar soft tissues measuring approximately 3.3 x 1.7 x 4.1 cm with foci of soft tissue air along the superficial margin of the collection, just below the level of the skin surface. Organizing phlegmon/early abscess formation can not be excluded. Surrounding subcutaneous edema and cutaneous thickening is concerning for cellulitis. Small right knee joint effusion Venous Doppler showed no evidence of DVT Continue with narcotics and gabapentin.   Microcytic anemia/B12 deficiency Hemoglobin was 8.8 on 5/9.  Hemoglobin noted to be 7.9.  Drop in hemoglobin is likely dilutional. Anemia panel shows ferritin of 24, iron 10, TIBC 398, percent saturation 3.  Folic acid 9.9.  Vitamin B12 269. Will benefit from iron supplementation at discharge. Started on B12 supplementation   LV dysfunction EF noted to be 30 to 35%.  Seen by cardiology who think this is likely due to severe infection and sepsis.   Management per cardiology.  Patient was started on losartan and metoprolol by cardiology.   Acute kidney injury/hypokalemia Came in with creatinine of 1.51.  Creatinine was 0.86 in September 2024.  Monitor urine output.  Creatinine is now back to baseline.  Monitor urine output. Supplement potassium.  Magnesium  noted to be 1.9.   History of anxiety Noted to be on Klonopin 1 mg 3 times a day.  This is  a chronic home medication for her.  Verified on PDMP database.  Dose decreased due to excessive somnolence.  The rationale for this was explained to the patient and she understands.   Obesity Estimated body mass index is 45.82 kg/m as calculated from  the following:   Height as of this encounter: 5\' 7"  (1.702 m).   Weight as of this encounter: 132.7 kg.   Estimated body mass index is 46.03 kg/m as calculated from the following:   Height as of this encounter: 5\' 7"  (1.702 m).   Weight as of this encounter: 133.3 kg.  DVT prophylaxis: lovenox  Code Status: full Family Communication:none) Disposition Plan:  Status is: Inpatient Remains inpatient appropriate because: acute illness   Consultants: Infectious disease, cardiology, Ortho, PCCM  Procedures:  Antimicrobials: Zyvox Subjective:  Better than yesterday able to move the right ankle better than yesterday walked with mobility about 200 feet Objective: Vitals:   06/26/23 0253 06/26/23 0257 06/26/23 0717 06/26/23 0901  BP: (!) 125/102  109/71   Pulse: 98  94   Resp: 18  17   Temp: (!) 97.5 F (36.4 C)  98.1 F (36.7 C)   TempSrc: Oral     SpO2: 100%  100% 98%  Weight:  133.3 kg    Height:       No intake or output data in the 24 hours ending 06/26/23 1345  Filed Weights   06/21/23 0500 06/23/23 0714 06/26/23 0257  Weight: 128 kg 132.7 kg 133.3 kg    Examination:  General exam: Appears in no acute distress  respiratory system: Clear to auscultation. Respiratory effort normal. Cardiovascular system: S1 & S2 heard, RRR. No JVD, murmurs, rubs, gallops or clicks. No pedal edema. Gastrointestinal system: Abdomen is nondistended, soft and nontender. No organomegaly or masses felt. Normal bowel sounds heard. Central nervous system: Alert and oriented. No focal neurological deficits. Extremities: Right leg edema erythema from right foot to right upper thighs   Data Reviewed: I have personally reviewed following labs and imaging studies  CBC: Recent Labs  Lab 06/19/23 1349 06/19/23 1402 06/22/23 1031 06/23/23 0508 06/24/23 0658 06/25/23 0312 06/26/23 0500  WBC 25.8*   < > 31.7* 21.3* 18.2* 12.6* 9.1  NEUTROABS 22.9*  --  27.6*  --   --   --   --   HGB 9.4*   <  > 7.9* 7.9* 8.0* 7.6* 7.9*  HCT 33.8*   < > 28.0* 27.7* 28.8* 27.7* 28.4*  MCV 70.1*   < > 68.6* 68.2* 68.4* 70.3* 70.0*  PLT 462*   < > 386 402* 458* 472* 523*   < > = values in this interval not displayed.   Basic Metabolic Panel: Recent Labs  Lab 06/19/23 1348 06/19/23 1349 06/20/23 0230 06/22/23 1031 06/23/23 0508 06/24/23 0658 06/25/23 0312 06/26/23 0500  NA  --    < > 141 138 139 137 138 139  K  --    < > 3.6 3.2* 3.5 3.7 3.4* 3.6  CL  --    < > 108 104 106 106 106 106  CO2  --    < > 20* 23 23 23 24 23   GLUCOSE  --    < > 94 124* 94 89 104* 112*  BUN  --    < > 14 11 9 8 9 6   CREATININE  --    < > 1.53* 1.16* 0.96 0.94 0.98 0.93  CALCIUM   --    < > 7.7* 8.1* 8.2*  8.3* 8.2* 8.2*  MG 1.8  --  1.5*  --  1.9  --   --   --   PHOS  --   --  5.2*  --   --   --   --   --    < > = values in this interval not displayed.   GFR: Estimated Creatinine Clearance: 113.5 mL/min (by C-G formula based on SCr of 0.93 mg/dL). Liver Function Tests: Recent Labs  Lab 06/19/23 1349 06/20/23 0230  AST 47* 36  ALT 48* 40  ALKPHOS 70 63  BILITOT 1.1 1.2  PROT 6.1* 5.4*  ALBUMIN 3.4* 2.8*   No results for input(s): "LIPASE", "AMYLASE" in the last 168 hours. No results for input(s): "AMMONIA" in the last 168 hours. Coagulation Profile: Recent Labs  Lab 06/19/23 1349  INR 1.2   Cardiac Enzymes: No results for input(s): "CKTOTAL", "CKMB", "CKMBINDEX", "TROPONINI" in the last 168 hours. BNP (last 3 results) No results for input(s): "PROBNP" in the last 8760 hours. HbA1C: No results for input(s): "HGBA1C" in the last 72 hours. CBG: Recent Labs  Lab 06/25/23 1145 06/25/23 1610 06/25/23 2124 06/26/23 0604 06/26/23 1132  GLUCAP 120* 91 77 100* 107*   Lipid Profile: Recent Labs    06/24/23 0658  CHOL 119  HDL 18*  LDLCALC 76  TRIG 540  CHOLHDL 6.6   Thyroid Function Tests: No results for input(s): "TSH", "T4TOTAL", "FREET4", "T3FREE", "THYROIDAB" in the last 72  hours. Anemia Panel: No results for input(s): "VITAMINB12", "FOLATE", "FERRITIN", "TIBC", "IRON", "RETICCTPCT" in the last 72 hours. Sepsis Labs: Recent Labs  Lab 06/19/23 1402 06/19/23 1728 06/19/23 2055 06/20/23 1918  PROCALCITON  --   --  6.37  --   LATICACIDVEN 2.3* 2.5* 2.2* 0.9    Recent Results (from the past 240 hours)  Blood Culture (routine x 2)     Status: None   Collection Time: 06/19/23 12:09 PM   Specimen: BLOOD LEFT ARM  Result Value Ref Range Status   Specimen Description BLOOD LEFT ARM  Final   Special Requests   Final    AEROBIC BOTTLE ONLY Blood Culture results may not be optimal due to an inadequate volume of blood received in culture bottles   Culture   Final    NO GROWTH 5 DAYS Performed at [facility], [address] [place], [place] [address]    Report Status 06/24/2023 FINAL  Final  Resp panel by RT-PCR (RSV, Flu A&B, Covid) Anterior Nasal Swab     Status: None   Collection Time: 06/19/23 12:10 PM   Specimen: Anterior Nasal Swab  Result Value Ref Range Status   SARS Coronavirus 2 by RT PCR NEGATIVE NEGATIVE Final   Influenza A by PCR NEGATIVE NEGATIVE Final   Influenza B by PCR NEGATIVE NEGATIVE Final    Comment: (NOTE) The Xpert Xpress SARS-CoV-2/FLU/RSV plus assay is intended as an aid in the diagnosis of influenza from Nasopharyngeal swab specimens and should not be used as a sole basis for treatment. Nasal washings and aspirates are unacceptable for Xpert Xpress SARS-CoV-2/FLU/RSV testing.  Fact Sheet for Patients: BloggerCourse.com  Fact Sheet for Healthcare Providers: SeriousBroker.it  This test is not yet approved or cleared by the [place]  FDA and has been authorized for detection and/or diagnosis of SARS-CoV-2 by FDA under an Emergency Use Authorization (EUA). This EUA will remain in effect (meaning this test can be used) for the duration of the COVID-19 declaration under  Section 564(b)(1)  of the Act, 21 U.S.C. section 360bbb-3(b)(1), unless the authorization is terminated or revoked.     Resp Syncytial Virus by PCR NEGATIVE NEGATIVE Final    Comment: (NOTE) Fact Sheet for Patients: BloggerCourse.com  Fact Sheet for Healthcare Providers: SeriousBroker.it  This test is not yet approved or cleared by the [place]  FDA and has been authorized for detection and/or diagnosis of SARS-CoV-2 by FDA under an Emergency Use Authorization (EUA). This EUA will remain in effect (meaning this test can be used) for the duration of the COVID-19 declaration under Section 564(b)(1) of the Act, 21 U.S.C. section 360bbb-3(b)(1), unless the authorization is terminated or revoked.  Performed at [facility], [address] [place], [place] [address]   Blood Culture (routine x 2)     Status: Abnormal   Collection Time: 06/19/23 12:14 PM   Specimen: BLOOD LEFT ARM  Result Value Ref Range Status   Specimen Description BLOOD LEFT ARM  Final   Special Requests   Final    BOTTLES DRAWN AEROBIC AND ANAEROBIC Blood Culture results may not be optimal due to an inadequate volume of blood received in culture bottles   Culture  Setup Time   Final    GRAM POSITIVE COCCI ANAEROBIC BOTTLE ONLY CRITICAL RESULT CALLED TO, READ BACK BY AND VERIFIED WITH: [facility]  06/20/23 MK Performed at [facility], [address] [place], [place] [address]    Culture VIRIDANS STREPTOCOCCUS (A)  Final   Report Status 06/22/2023 FINAL  Final   Organism ID, Bacteria VIRIDANS STREPTOCOCCUS  Final      Susceptibility   Viridans streptococcus - MIC*    PENICILLIN 0.25 INTERMEDIATE Intermediate     CEFTRIAXONE  0.25 SENSITIVE Sensitive     ERYTHROMYCIN <=0.12 SENSITIVE Sensitive     LEVOFLOXACIN 1 SENSITIVE Sensitive     VANCOMYCIN  0.5 SENSITIVE Sensitive     * VIRIDANS STREPTOCOCCUS  Blood Culture ID Panel (Reflexed)      Status: Abnormal   Collection Time: 06/19/23 12:14 PM  Result Value Ref Range Status   Enterococcus faecalis NOT DETECTED NOT DETECTED Final   Enterococcus Faecium NOT DETECTED NOT DETECTED Final   Listeria monocytogenes NOT DETECTED NOT DETECTED Final   Staphylococcus species NOT DETECTED NOT DETECTED Final   Staphylococcus aureus (BCID) NOT DETECTED NOT DETECTED Final   Staphylococcus epidermidis NOT DETECTED NOT DETECTED Final   Staphylococcus lugdunensis NOT DETECTED NOT DETECTED Final   Streptococcus species DETECTED (A) NOT DETECTED Final    Comment: Not Enterococcus species, Streptococcus agalactiae, Streptococcus pyogenes, or Streptococcus pneumoniae. CRITICAL RESULT CALLED TO, READ BACK BY AND VERIFIED WITH: J WYLAND,[email or web address]  06/20/23 MK    Streptococcus agalactiae NOT DETECTED NOT DETECTED Final   Streptococcus pneumoniae NOT DETECTED NOT DETECTED Final   Streptococcus pyogenes NOT DETECTED NOT DETECTED Final   A.calcoaceticus-baumannii NOT DETECTED NOT DETECTED Final   Bacteroides fragilis NOT DETECTED NOT DETECTED Final   Enterobacterales NOT DETECTED NOT DETECTED Final   Enterobacter cloacae complex NOT DETECTED NOT DETECTED Final   Escherichia coli NOT DETECTED NOT DETECTED Final   Klebsiella aerogenes NOT DETECTED NOT DETECTED Final   Klebsiella oxytoca NOT DETECTED NOT DETECTED Final   Klebsiella pneumoniae NOT DETECTED NOT DETECTED Final   Proteus species NOT DETECTED NOT DETECTED Final   Salmonella species NOT DETECTED NOT DETECTED Final   Serratia marcescens NOT DETECTED NOT DETECTED Final   Haemophilus influenzae NOT DETECTED NOT DETECTED Final   Neisseria meningitidis NOT DETECTED NOT DETECTED Final  Pseudomonas aeruginosa NOT DETECTED NOT DETECTED Final   Stenotrophomonas maltophilia NOT DETECTED NOT DETECTED Final   Candida albicans NOT DETECTED NOT DETECTED Final   Candida auris NOT DETECTED NOT DETECTED Final   Candida glabrata NOT DETECTED NOT  DETECTED Final   Candida krusei NOT DETECTED NOT DETECTED Final   Candida parapsilosis NOT DETECTED NOT DETECTED Final   Candida tropicalis NOT DETECTED NOT DETECTED Final   Cryptococcus neoformans/gattii NOT DETECTED NOT DETECTED Final    Comment: Performed at [facility], [address], [place], [place] [address]  Body fluid culture w Gram Stain     Status: None   Collection Time: 06/19/23  1:02 PM   Specimen: Body Fluid  Result Value Ref Range Status   Specimen Description FLUID  Final   Special Requests RIGHT KNEE  Final   Gram Stain   Final    RARE WBC PRESENT,BOTH PMN AND MONONUCLEAR NO ORGANISMS SEEN    Culture   Final    RARE STREPTOCOCCUS PYOGENES Beta hemolytic streptococci are predictably susceptible to penicillin and other beta lactams. Susceptibility testing not routinely performed. Performed at [facility], [address] [place], [place] [address]    Report Status 06/21/2023 FINAL  Final  MRSA Next Gen by PCR, Nasal     Status: None   Collection Time: 06/19/23  5:56 PM   Specimen: Nasal Mucosa; Nasal Swab  Result Value Ref Range Status   MRSA by PCR Next Gen NOT DETECTED NOT DETECTED Final    Comment: (NOTE) The GeneXpert MRSA Assay (FDA approved for NASAL specimens only), is one component of a comprehensive MRSA colonization surveillance program. It is not intended to diagnose MRSA infection nor to guide or monitor treatment for MRSA infections. Test performance is not FDA approved in patients less than 12 years old. Performed at [facility], [address], [place], [place] [address]   Aerobic/Anaerobic Culture w Gram Stain (surgical/deep wound)     Status: None   Collection Time: 06/20/23  1:29 PM   Specimen: Wound  Result Value Ref Range Status   Specimen Description WOUND  Final   Special Requests RIGHT PREPATELLAR BURSA  Final   Gram Stain NO WBC SEEN GRAM POSITIVE COCCI IN PAIRS   Final   Culture   Final    RARE GROUP A STREP  (S.PYOGENES) ISOLATED Beta hemolytic streptococci are predictably susceptible to penicillin and other beta lactams. Susceptibility testing not routinely performed. NO ANAEROBES ISOLATED Performed at [facility], [address], [place], [place] [address]    Report Status 06/25/2023 FINAL  Final  Aerobic/Anaerobic Culture w Gram Stain (surgical/deep wound)     Status: None   Collection Time: 06/20/23  1:34 PM   Specimen: Wound; Body Fluid  Result Value Ref Range Status   Specimen Description WOUND  Final   Special Requests RIGHT KNEE ASPIRATION  Final   Gram Stain NO WBC SEEN NO ORGANISMS SEEN   Final   Culture   Final    No growth aerobically or anaerobically. Performed at [facility], [address], [place], [place] [address]    Report Status 06/25/2023 FINAL  Final  Culture, blood (Routine X 2) w Reflex to ID Panel     Status: None   Collection Time: 06/21/23 10:22 AM   Specimen: BLOOD LEFT HAND  Result Value Ref Range Status   Specimen Description BLOOD LEFT HAND  Final   Special Requests  Final    BOTTLES DRAWN AEROBIC AND ANAEROBIC Blood Culture results may not be optimal due to an inadequate volume of blood received in culture bottles   Culture   Final    NO GROWTH 5 DAYS Performed at [facility], [address], [place], [place] [address]    Report Status 06/26/2023 FINAL  Final  Culture, blood (Routine X 2) w Reflex to ID Panel     Status: None   Collection Time: 06/21/23 10:28 AM   Specimen: BLOOD RIGHT HAND  Result Value Ref Range Status   Specimen Description BLOOD RIGHT HAND  Final   Special Requests   Final    BOTTLES DRAWN AEROBIC AND ANAEROBIC Blood Culture results may not be optimal due to an inadequate volume of blood received in culture bottles   Culture   Final    NO GROWTH 5 DAYS Performed at [facility], [address], [place], [place] [address]    Report Status 06/26/2023 FINAL  Final         Radiology Studies: CT  ANKLE RIGHT W CONTRAST Result Date: 06/25/2023 CLINICAL DATA:  Right ankle swelling and tenderness. History of bacteremia. EXAM: CT OF THE RIGHT ANKLE WITH CONTRAST TECHNIQUE: Multidetector CT imaging of the right ankle was performed following the standard protocol during bolus administration of intravenous contrast. RADIATION DOSE REDUCTION: This exam was performed according to the departmental dose-optimization program which includes automated exposure control, adjustment of the mA and/or kV according to patient size and/or use of iterative reconstruction technique. CONTRAST:  75mL OMNIPAQUE IOHEXOL 350 MG/ML SOLN COMPARISON:  None Available. FINDINGS: Bones/Joint/Cartilage No fracture or dislocation. Normal alignment. Mild joint space narrowing with dorsal spurring at the talonavicular articulation. Ankle mortise is congruent. Calcaneal enthesopathy at the insertion of the Achilles tendon. Tiny plantar calcaneal spur. Ligaments Ligaments are suboptimally evaluated by CT. Muscles and Tendons Muscles are normal. No muscle atrophy. No intramuscular fluid collection or hematoma. Soft tissue Generalized nonspecific circumferential subcutaneous edema of the visualized right lower extremity extending through the ankle and into the dorsal foot. No loculated fluid collection. No soft tissue gas. IMPRESSION: 1. Generalized nonspecific circumferential subcutaneous edema of the visualized right lower extremity extending through the ankle and into the dorsal foot, concerning for cellulitis in the appropriate setting. No loculated fluid collection. No soft tissue gas. 2. No acute osseous abnormality. 3. Mild degenerative changes at the talonavicular joint. Electronically Signed   By: Mannie Seek M.D.   On: 06/25/2023 13:27        Scheduled Meds:  acetaminophen   650 mg Oral Q6H   clonazePAM  0.5 mg Oral TID   vitamin B-12  1,000 mcg Oral Daily   enoxaparin  (LOVENOX ) injection  60 mg Subcutaneous Q24H    escitalopram  10 mg Oral Daily   fluticasone furoate-vilanterol  1 puff Inhalation Daily   furosemide  20 mg Intravenous Daily   gabapentin  400 mg Oral TID   insulin  aspart  0-5 Units Subcutaneous QHS   insulin  aspart  0-9 Units Subcutaneous TID WC   linezolid  600 mg Oral Q12H   losartan  25 mg Oral QPM   metoprolol succinate  25 mg Oral Daily   polyethylene glycol  17 g Oral Daily   senna-docusate  2 tablet Oral BID   sodium chloride  flush  10-40 mL Intracatheter Q12H   Continuous Infusions:     LOS: 7 days    Time spent: 39 minutes  Teretha Ferguson  Alla Isaacs, MD 06/26/2023, 1:45 PM

## 2023-06-27 ENCOUNTER — Encounter (HOSPITAL_COMMUNITY): Payer: Self-pay | Admitting: Pulmonary Disease

## 2023-06-27 DIAGNOSIS — M71061 Abscess of bursa, right knee: Secondary | ICD-10-CM | POA: Diagnosis not present

## 2023-06-27 LAB — GLUCOSE, CAPILLARY
Glucose-Capillary: 95 mg/dL (ref 70–99)
Glucose-Capillary: 123 mg/dL — ABNORMAL HIGH (ref 70–99)
Glucose-Capillary: 95 mg/dL (ref 70–99)
Glucose-Capillary: 91 mg/dL (ref 70–99)

## 2023-06-27 LAB — CBC
HCT: 27.4 % — ABNORMAL LOW (ref 36.0–46.0)
Hemoglobin: 7.4 g/dL — ABNORMAL LOW (ref 12.0–15.0)
MCH: 19 pg — ABNORMAL LOW (ref 26.0–34.0)
MCHC: 27 g/dL — ABNORMAL LOW (ref 30.0–36.0)
MCV: 70.4 fL — ABNORMAL LOW (ref 80.0–100.0)
Platelets: 539 10*3/uL — ABNORMAL HIGH (ref 150–400)
RBC: 3.89 MIL/uL (ref 3.87–5.11)
RDW: 19 % — ABNORMAL HIGH (ref 11.5–15.5)
WBC: 8.2 10*3/uL (ref 4.0–10.5)
nRBC: 0.5 % — ABNORMAL HIGH (ref 0.0–0.2)

## 2023-06-27 LAB — PREPARE RBC (CROSSMATCH)

## 2023-06-27 LAB — BASIC METABOLIC PANEL WITH GFR
Anion gap: 7 (ref 5–15)
BUN: 8 mg/dL (ref 6–20)
CO2: 26 mmol/L (ref 22–32)
Calcium: 8.2 mg/dL — ABNORMAL LOW (ref 8.9–10.3)
Chloride: 106 mmol/L (ref 98–111)
Creatinine, Ser: 1.22 mg/dL — ABNORMAL HIGH (ref 0.44–1.00)
GFR, Estimated: 57 mL/min — ABNORMAL LOW (ref >60)
Glucose, Bld: 100 mg/dL — ABNORMAL HIGH (ref 70–99)
Potassium: 3.7 mmol/L (ref 3.5–5.1)
Sodium: 139 mmol/L (ref 135–145)

## 2023-06-27 LAB — ABO/RH: ABO/RH(D): O POS

## 2023-06-27 MED ORDER — IRON SUCROSE 500 MG IVPB - SIMPLE MED
500.0000 mg | Freq: Once | INTRAVENOUS | Status: DC
  Filled 2023-06-27: qty 275

## 2023-06-27 MED ORDER — SODIUM CHLORIDE 0.9% IV SOLUTION: Freq: Once | INTRAVENOUS | Status: AC

## 2023-06-27 MED ORDER — SODIUM CHLORIDE 0.9 % IV SOLN
500.0000 mg | Freq: Once | INTRAVENOUS | Status: AC
  Administered 2023-06-27: 500 mg via INTRAVENOUS
  Filled 2023-06-27: qty 25

## 2023-06-27 NOTE — Progress Notes (Signed)
 PROGRESS NOTE    Roberta Soto  GNF:[id number] DOB: 1980-03-10 DOA: 06/19/2023 PCP: Patient, No Pcp Per   Brief Narrative: 43 y.o. F with PMH significant for asthma and HTN who presented to the ED with confusion and R knee pain. Patient was noted to be quite agitated and tachycardic. She was initially admitted to the ICU. There was concern for right knee infection. Orthopedics was consulted.  Patient has been seen in consultation by PCCM Ortho infectious disease and cardiology.  Assessment & Plan:   Principal Problem:   Abscess of bursa of right knee Active Problems:   Sepsis  AMS  Hemodynamic instability   Chronic health problem   AMS (altered mental status)   Asymptomatic LV dysfunction   Benign hypertension   Class 3 severe obesity due to excess calories with serious comorbidity and body mass index (BMI) of 40.0 to 44.9 in adult   Sepsis due to Streptococcus pyogenes (HCC)   Cardiomyopathy (HCC)   Strep pyogenes bacteremia/right leg cellulitis/right knee bursitis/sepsis present on admission status post irrigation and debridement of the right knee Patient had sepsis physiology at admission.   Blood culture from 5/8 is growing strep viridans.  Strep pyogenes also noted in right knee aspirate as well as aspirate from the prepatellar bursa. Repeat blood culture sent on 5/10 without any growth so far. Infectious diseases following.  Orthopedics is following.  Patient initially was also on Rocephin  and Zyvox was added by ID with improvement in leukocytosis and lower extremity erythema and tenderness and swelling.  Rocephin  stopped on May 15.  ID recommends to continue Zyvox for an additional 3 weeks of Zyvox 600 mg p.o. twice daily. CT Right ankle- Generalized nonspecific circumferential subcutaneous edema of the visualized right lower extremity extending through the ankle and into the dorsal foot, concerning for cellulitis in the appropriate setting. No loculated fluid collection. No  soft tissue gas.No acute osseous abnormality. Mild degenerative changes at the talonavicular joint.  Hopefully discharge home tomorrow on p.o. antibiotics if okay with Ortho.  CT of the right lower extremity - Status post incision and drainage of the right knee prepatellar bursa with ill-defined region of hypodense fluid of indeterminate sterility in the prepatellar soft tissues measuring approximately 3.3 x 1.7 x 4.1 cm with foci of soft tissue air along the superficial margin of the collection, just below the level of the skin surface. Organizing phlegmon/early abscess formation can not be excluded. Surrounding subcutaneous edema and cutaneous thickening is concerning for cellulitis. Small right knee joint effusion Venous Doppler showed no evidence of DVT Continue with narcotics and gabapentin.   Microcytic anemia/B12 deficiency Hemoglobin was 8.8 on 5/9.  Hemoglobin noted to be 7.9.  Drop in hemoglobin is likely dilutional. Anemia panel shows ferritin of 24, iron 10, TIBC 398, percent saturation 3.  Folic acid 9.9.  Vitamin B12 269. Will benefit from iron supplementation at discharge. Started on B12 supplementation Venofer today 1 unit prbc   LV dysfunction EF noted to be 30 to 35%.  Seen by cardiology who think this is likely due to severe infection and sepsis.   Management per cardiology.  Patient was started on losartan and metoprolol by cardiology.   Acute kidney injury/hypokalemia Came in with creatinine of 1.51.  Creatinine was 0.86 in September 2024.   Cr trending up with ace lasix aldactone Holding cozaar  Will restart on dc  History of anxiety Noted to be on Klonopin 1 mg 3 times a day.  This is a chronic home  medication for her.  Verified on PDMP database.  Dose decreased due to excessive somnolence.  The rationale for this was explained to the patient and she understands.   Obesity Estimated body mass index is 45.82 kg/m as calculated from the following:   Height as  of this encounter: 5\' 7"  (1.702 m).   Weight as of this encounter: 132.7 kg.   Estimated body mass index is 46.99 kg/m as calculated from the following:   Height as of this encounter: 5\' 7"  (1.702 m).   Weight as of this encounter: 136.1 kg.  DVT prophylaxis: lovenox  Code Status: full Family Communication:none) Disposition Plan:  Status is: Inpatient Remains inpatient appropriate because: acute illness   Consultants: Infectious disease, cardiology, Ortho, PCCM  Procedures:  Antimicrobials: Zyvox Subjective: Creatinine 1.22 from 0.93 Feels improved walking with mobility specialist.   Does not want to be discharged would like to wait for her swelling to go away in her right leg prior to discharge  Objective: Vitals:   06/27/23 0429 06/27/23 0431 06/27/23 0444 06/27/23 0718  BP:  107/69  100/70  Pulse: 94 94  88  Resp:  17  16  Temp:  (!) 97.5 F (36.4 C)  97.9 F (36.6 C)  TempSrc:    Oral  SpO2: 95% 99%  100%  Weight:   (!) 136.1 kg   Height:        Intake/Output Summary (Last 24 hours) at 06/27/2023 1003 Last data filed at 06/26/2023 1700 Gross per 24 hour  Intake 480 ml  Output --  Net 480 ml    Filed Weights   06/23/23 0714 06/26/23 0257 06/27/23 0444  Weight: 132.7 kg 133.3 kg (!) 136.1 kg    Examination:  General exam: Appears in no acute distress  respiratory system: Clear to auscultation. Respiratory effort normal. Cardiovascular system: S1 & S2 heard, RRR. No JVD, murmurs, rubs, gallops or clicks. No pedal edema. Gastrointestinal system: Abdomen is nondistended, soft and nontender. No organomegaly or masses felt. Normal bowel sounds heard. Central nervous system: Alert and oriented. No focal neurological deficits. Extremities: Right leg edema erythema from right foot to right upper thighs   Data Reviewed: I have personally reviewed following labs and imaging studies  CBC: Recent Labs  Lab 06/22/23 1031 06/23/23 0508 06/24/23 0658  06/25/23 0312 06/26/23 0500 06/27/23 0030  WBC 31.7* 21.3* 18.2* 12.6* 9.1 8.2  NEUTROABS 27.6*  --   --   --   --   --   HGB 7.9* 7.9* 8.0* 7.6* 7.9* 7.4*  HCT 28.0* 27.7* 28.8* 27.7* 28.4* 27.4*  MCV 68.6* 68.2* 68.4* 70.3* 70.0* 70.4*  PLT 386 402* 458* 472* 523* 539*   Basic Metabolic Panel: Recent Labs  Lab 06/23/23 0508 06/24/23 0658 06/25/23 0312 06/26/23 0500 06/27/23 0030  NA 139 137 138 139 139  K 3.5 3.7 3.4* 3.6 3.7  CL 106 106 106 106 106  CO2 23 23 24 23 26   GLUCOSE 94 89 104* 112* 100*  BUN 9 8 9 6 8   CREATININE 0.96 0.94 0.98 0.93 1.22*  CALCIUM  8.2* 8.3* 8.2* 8.2* 8.2*  MG 1.9  --   --   --   --    GFR: Estimated Creatinine Clearance: 87.6 mL/min (A) (by C-G formula based on SCr of 1.22 mg/dL (H)). Liver Function Tests: No results for input(s): "AST", "ALT", "ALKPHOS", "BILITOT", "PROT", "ALBUMIN" in the last 168 hours.  No results for input(s): "LIPASE", "AMYLASE" in the last 168 hours. No results  for input(s): "AMMONIA" in the last 168 hours. Coagulation Profile: No results for input(s): "INR", "PROTIME" in the last 168 hours.  Cardiac Enzymes: No results for input(s): "CKTOTAL", "CKMB", "CKMBINDEX", "TROPONINI" in the last 168 hours. BNP (last 3 results) No results for input(s): "PROBNP" in the last 8760 hours. HbA1C: No results for input(s): "HGBA1C" in the last 72 hours. CBG: Recent Labs  Lab 06/26/23 0604 06/26/23 1132 06/26/23 1555 06/26/23 2158 06/27/23 0644  GLUCAP 100* 107* 132* 103* 95   Lipid Profile: No results for input(s): "CHOL", "HDL", "LDLCALC", "TRIG", "CHOLHDL", "LDLDIRECT" in the last 72 hours.  Thyroid Function Tests: No results for input(s): "TSH", "T4TOTAL", "FREET4", "T3FREE", "THYROIDAB" in the last 72 hours. Anemia Panel: No results for input(s): "VITAMINB12", "FOLATE", "FERRITIN", "TIBC", "IRON", "RETICCTPCT" in the last 72 hours. Sepsis Labs: Recent Labs  Lab 06/20/23 1918  LATICACIDVEN 0.9    Recent  Results (from the past 240 hours)  Blood Culture (routine x 2)     Status: None   Collection Time: 06/19/23 12:09 PM   Specimen: BLOOD LEFT ARM  Result Value Ref Range Status   Specimen Description BLOOD LEFT ARM  Final   Special Requests   Final    AEROBIC BOTTLE ONLY Blood Culture results may not be optimal due to an inadequate volume of blood received in culture bottles   Culture   Final    NO GROWTH 5 DAYS Performed at [facility], [address], [place], [place] [address]    Report Status 06/24/2023 FINAL  Final  Resp panel by RT-PCR (RSV, Flu A&B, Covid) Anterior Nasal Swab     Status: None   Collection Time: 06/19/23 12:10 PM   Specimen: Anterior Nasal Swab  Result Value Ref Range Status   SARS Coronavirus 2 by RT PCR NEGATIVE NEGATIVE Final   Influenza A by PCR NEGATIVE NEGATIVE Final   Influenza B by PCR NEGATIVE NEGATIVE Final    Comment: (NOTE) The Xpert Xpress SARS-CoV-2/FLU/RSV plus assay is intended as an aid in the diagnosis of influenza from Nasopharyngeal swab specimens and should not be used as a sole basis for treatment. Nasal washings and aspirates are unacceptable for Xpert Xpress SARS-CoV-2/FLU/RSV testing.  Fact Sheet for Patients: BloggerCourse.com  Fact Sheet for Healthcare Providers: SeriousBroker.it  This test is not yet approved or cleared by the [place]  FDA and has been authorized for detection and/or diagnosis of SARS-CoV-2 by FDA under an Emergency Use Authorization (EUA). This EUA will remain in effect (meaning this test can be used) for the duration of the COVID-19 declaration under Section 564(b)(1) of the Act, 21 U.S.C. section 360bbb-3(b)(1), unless the authorization is terminated or revoked.     Resp Syncytial Virus by PCR NEGATIVE NEGATIVE Final    Comment: (NOTE) Fact Sheet for Patients: BloggerCourse.com  Fact Sheet for Healthcare  Providers: SeriousBroker.it  This test is not yet approved or cleared by the [place]  FDA and has been authorized for detection and/or diagnosis of SARS-CoV-2 by FDA under an Emergency Use Authorization (EUA). This EUA will remain in effect (meaning this test can be used) for the duration of the COVID-19 declaration under Section 564(b)(1) of the Act, 21 U.S.C. section 360bbb-3(b)(1), unless the authorization is terminated or revoked.  Performed at [facility], [address], [place], [place] [address]   Blood Culture (routine x 2)     Status: Abnormal   Collection Time: 06/19/23 12:14 PM   Specimen: BLOOD LEFT ARM  Result  Value Ref Range Status   Specimen Description BLOOD LEFT ARM  Final   Special Requests   Final    BOTTLES DRAWN AEROBIC AND ANAEROBIC Blood Culture results may not be optimal due to an inadequate volume of blood received in culture bottles   Culture  Setup Time   Final    GRAM POSITIVE COCCI ANAEROBIC BOTTLE ONLY CRITICAL RESULT CALLED TO, READ BACK BY AND VERIFIED WITH: J [facility]  06/20/23 MK Performed at [facility], [address] [place], [place] [address]    Culture VIRIDANS STREPTOCOCCUS (A)  Final   Report Status 06/22/2023 FINAL  Final   Organism ID, Bacteria VIRIDANS STREPTOCOCCUS  Final      Susceptibility   Viridans streptococcus - MIC*    PENICILLIN 0.25 INTERMEDIATE Intermediate     CEFTRIAXONE  0.25 SENSITIVE Sensitive     ERYTHROMYCIN <=0.12 SENSITIVE Sensitive     LEVOFLOXACIN 1 SENSITIVE Sensitive     VANCOMYCIN  0.5 SENSITIVE Sensitive     * VIRIDANS STREPTOCOCCUS  Blood Culture ID Panel (Reflexed)     Status: Abnormal   Collection Time: 06/19/23 12:14 PM  Result Value Ref Range Status   Enterococcus faecalis NOT DETECTED NOT DETECTED Final   Enterococcus Faecium NOT DETECTED NOT DETECTED Final   Listeria monocytogenes NOT DETECTED NOT DETECTED Final   Staphylococcus species NOT  DETECTED NOT DETECTED Final   Staphylococcus aureus (BCID) NOT DETECTED NOT DETECTED Final   Staphylococcus epidermidis NOT DETECTED NOT DETECTED Final   Staphylococcus lugdunensis NOT DETECTED NOT DETECTED Final   Streptococcus species DETECTED (A) NOT DETECTED Final    Comment: Not Enterococcus species, Streptococcus agalactiae, Streptococcus pyogenes, or Streptococcus pneumoniae. CRITICAL RESULT CALLED TO, READ BACK BY AND VERIFIED WITH: J WYLAND,[email or web address]  06/20/23 MK    Streptococcus agalactiae NOT DETECTED NOT DETECTED Final   Streptococcus pneumoniae NOT DETECTED NOT DETECTED Final   Streptococcus pyogenes NOT DETECTED NOT DETECTED Final   A.calcoaceticus-baumannii NOT DETECTED NOT DETECTED Final   Bacteroides fragilis NOT DETECTED NOT DETECTED Final   Enterobacterales NOT DETECTED NOT DETECTED Final   Enterobacter cloacae complex NOT DETECTED NOT DETECTED Final   Escherichia coli NOT DETECTED NOT DETECTED Final   Klebsiella aerogenes NOT DETECTED NOT DETECTED Final   Klebsiella oxytoca NOT DETECTED NOT DETECTED Final   Klebsiella pneumoniae NOT DETECTED NOT DETECTED Final   Proteus species NOT DETECTED NOT DETECTED Final   Salmonella species NOT DETECTED NOT DETECTED Final   Serratia marcescens NOT DETECTED NOT DETECTED Final   Haemophilus influenzae NOT DETECTED NOT DETECTED Final   Neisseria meningitidis NOT DETECTED NOT DETECTED Final   Pseudomonas aeruginosa NOT DETECTED NOT DETECTED Final   Stenotrophomonas maltophilia NOT DETECTED NOT DETECTED Final   Candida albicans NOT DETECTED NOT DETECTED Final   Candida auris NOT DETECTED NOT DETECTED Final   Candida glabrata NOT DETECTED NOT DETECTED Final   Candida krusei NOT DETECTED NOT DETECTED Final   Candida parapsilosis NOT DETECTED NOT DETECTED Final   Candida tropicalis NOT DETECTED NOT DETECTED Final   Cryptococcus neoformans/gattii NOT DETECTED NOT DETECTED Final    Comment: Performed at [facility], [address] [place], [place] [address]  Body fluid culture w Gram Stain     Status: None   Collection Time: 06/19/23  1:02 PM   Specimen: Body Fluid  Result Value Ref Range Status   Specimen Description FLUID  Final   Special Requests RIGHT KNEE  Final   Gram Stain   Final  RARE WBC PRESENT,BOTH PMN AND MONONUCLEAR NO ORGANISMS SEEN    Culture   Final    RARE STREPTOCOCCUS PYOGENES Beta hemolytic streptococci are predictably susceptible to penicillin and other beta lactams. Susceptibility testing not routinely performed. Performed at [facility], [address], [place], [place] [address]    Report Status 06/21/2023 FINAL  Final  MRSA Next Gen by PCR, Nasal     Status: None   Collection Time: 06/19/23  5:56 PM   Specimen: Nasal Mucosa; Nasal Swab  Result Value Ref Range Status   MRSA by PCR Next Gen NOT DETECTED NOT DETECTED Final    Comment: (NOTE) The GeneXpert MRSA Assay (FDA approved for NASAL specimens only), is one component of a comprehensive MRSA colonization surveillance program. It is not intended to diagnose MRSA infection nor to guide or monitor treatment for MRSA infections. Test performance is not FDA approved in patients less than 69 years old. Performed at [facility], [address] [place], [place] [address]   Aerobic/Anaerobic Culture w Gram Stain (surgical/deep wound)     Status: None   Collection Time: 06/20/23  1:29 PM   Specimen: Wound  Result Value Ref Range Status   Specimen Description WOUND  Final   Special Requests RIGHT PREPATELLAR BURSA  Final   Gram Stain NO WBC SEEN GRAM POSITIVE COCCI IN PAIRS   Final   Culture   Final    RARE GROUP A STREP (S.PYOGENES) ISOLATED Beta hemolytic streptococci are predictably susceptible to penicillin and other beta lactams. Susceptibility testing not routinely performed. NO ANAEROBES ISOLATED Performed at [facility], [address] [place], [place] [address]    Report Status 06/25/2023  FINAL  Final  Aerobic/Anaerobic Culture w Gram Stain (surgical/deep wound)     Status: None   Collection Time: 06/20/23  1:34 PM   Specimen: Wound; Body Fluid  Result Value Ref Range Status   Specimen Description WOUND  Final   Special Requests RIGHT KNEE ASPIRATION  Final   Gram Stain NO WBC SEEN NO ORGANISMS SEEN   Final   Culture   Final    No growth aerobically or anaerobically. Performed at [facility], [address], [place], [place] [address]    Report Status 06/25/2023 FINAL  Final  Culture, blood (Routine X 2) w Reflex to ID Panel     Status: None   Collection Time: 06/21/23 10:22 AM   Specimen: BLOOD LEFT HAND  Result Value Ref Range Status   Specimen Description BLOOD LEFT HAND  Final   Special Requests   Final    BOTTLES DRAWN AEROBIC AND ANAEROBIC Blood Culture results may not be optimal due to an inadequate volume of blood received in culture bottles   Culture   Final    NO GROWTH 5 DAYS Performed at [facility], [address] [place], [place] [address]    Report Status 06/26/2023 FINAL  Final  Culture, blood (Routine X 2) w Reflex to ID Panel     Status: None   Collection Time: 06/21/23 10:28 AM   Specimen: BLOOD RIGHT HAND  Result Value Ref Range Status   Specimen Description BLOOD RIGHT HAND  Final   Special Requests   Final    BOTTLES DRAWN AEROBIC AND ANAEROBIC Blood Culture results may not be optimal due to an inadequate volume of blood received in culture bottles   Culture   Final    NO GROWTH 5 DAYS  Performed at [facility], [address] [place], [place] [address]    Report Status 06/26/2023 FINAL  Final         Radiology Studies: No results found.       Scheduled Meds:  acetaminophen   650 mg Oral Q6H   clonazePAM  0.5 mg Oral TID   vitamin B-12  1,000 mcg Oral Daily   enoxaparin  (LOVENOX ) injection  60 mg Subcutaneous Q24H   escitalopram  10 mg Oral Daily   fluticasone furoate-vilanterol  1 puff Inhalation  Daily   furosemide  20 mg Intravenous Daily   gabapentin  400 mg Oral TID   insulin  aspart  0-5 Units Subcutaneous QHS   insulin  aspart  0-9 Units Subcutaneous TID WC   linezolid  600 mg Oral Q12H   losartan  25 mg Oral QPM   metoprolol succinate  25 mg Oral Daily   polyethylene glycol  17 g Oral Daily   senna-docusate  2 tablet Oral BID   sodium chloride  flush  10-40 mL Intracatheter Q12H   spironolactone  12.5 mg Oral Daily   Continuous Infusions:     LOS: 8 days    Time spent: 39 minutes  Barbee Lew, MD 06/27/2023, 10:03 AM

## 2023-06-27 NOTE — Progress Notes (Signed)
 Subjective: 7 Days Post-Op Procedure(s) (LRB): IRRIGATION AND DEBRIDEMENT RIGHT KNEE (Right) Patient reports pain as mild.  Feeling "tightness and pressure" from edema, but minimal to no pain.    Objective: Vital signs in last 24 hours: Temp:  [97.5 F (36.4 C)-98.5 F (36.9 C)] 97.9 F (36.6 C) (05/16 0718) Pulse Rate:  [88-94] 88 (05/16 0718) Resp:  [16-18] 16 (05/16 0718) BP: (100-110)/(62-72) 100/70 (05/16 0718) SpO2:  [95 %-100 %] 100 % (05/16 0718) Weight:  [136.1 kg] 136.1 kg (05/16 0444)  Intake/Output from previous day: 05/15 0701 - 05/16 0700 In: 480 [P.O.:480] Out: -  Intake/Output this shift: No intake/output data recorded.  Recent Labs    06/25/23 0312 06/26/23 0500 06/27/23 0030  HGB 7.6* 7.9* 7.4*   Recent Labs    06/26/23 0500 06/27/23 0030  WBC 9.1 8.2  RBC 4.06 3.89  HCT 28.4* 27.4*  PLT 523* 539*   Recent Labs    06/26/23 0500 06/27/23 0030  NA 139 139  K 3.6 3.7  CL 106 106  CO2 23 26  BUN 6 8  CREATININE 0.93 1.22*  GLUCOSE 112* 100*  CALCIUM  8.2* 8.2*   No results for input(s): "LABPT", "INR" in the last 72 hours.  Neurologically intact Neurovascular intact Sensation intact distally Intact pulses distally Dorsiflexion/Plantar flexion intact Compartment soft Swelling, erythema, induration from right thigh to right ankle.  No drainage to knee wound.  Painless ROM or right knee/ankle   Assessment/Plan: 7 Days Post-Op Procedure(s) (LRB): IRRIGATION AND DEBRIDEMENT RIGHT KNEE (Right) Up with therapy WBAT RLE Continue wet-to-dry dressing changes bid to right knee Continue abx per ID I believe patient has cellulitis to the RLE consistent with the aggressive nature of group A strep.  Labs are continuing to trend down.  She is continuing to clinically improve    Sandie Cross 06/27/2023, 7:59 AM

## 2023-06-27 NOTE — Progress Notes (Signed)
 Patient ID: Roberta Soto, female   DOB: 1980/10/23, 43 y.o.   MRN: [id number]   [facility] for d/c from ortho standpoint.  Follow up with us  in one week.

## 2023-06-27 NOTE — Progress Notes (Signed)
 Mobility Specialist Progress Note:    06/27/23 1000  Mobility  Activity Ambulated independently in hallway;Ambulated with assistance in hallway  Level of Assistance Standby assist, set-up cues, supervision of patient - no hands on  Assistive Device Front wheel walker;None  Distance Ambulated (ft) 300 ft  RLE Weight Bearing Per Provider Order WBAT  Activity Response Tolerated well  Mobility Referral Yes  Mobility visit 1 Mobility  Mobility Specialist Start Time (ACUTE ONLY) 1006  Mobility Specialist Stop Time (ACUTE ONLY) 1034  Mobility Specialist Time Calculation (min) (ACUTE ONLY) 28 min   Pt received ambulating in room and agreeable. Ambulated first 150' w/ RW and 150' back to room w/ no assistive device. No complaints throughout and no LOB. Pt left on EOB with call bell and all needs met.  D'Vante Nolon Baxter Mobility Specialist Please contact via Special educational needs teacher or Rehab office at [phone]

## 2023-06-27 NOTE — Progress Notes (Signed)
 Heart Failure Nurse Navigator Progress Note  PCP: Patient, No Pcp Per PCP-Cardiologist: None Admission Diagnosis: Transient alteration of awareness, sepsis [facility]: [facility]  Presentation:   Roberta Soto presented with AMS and knee pain, reports to falling on her knee a month ago, and now knee is red and she has lower edema to the right leg. BP 160/91, HR 147, Temp 103.2, BMI 39.71, Lactic acid 2.3, Admitted for sepsis due to cellulitis vs septic arthritis. Ortho consulted and taken to the OR on 5/9 for exploration. Blood cultures positive for strep pyogenes. ECHO 5/9 showed LVEF 30-35%, global hypokinesis, RV normal, mild MR, no vegetations.   Patient was educated on the sign and symptoms of heart failure, daily weights, when to call her doctor or go to the ED, ( a scale was provided to the patient ) continued education on taking all medications as prescribed and attending all medical appointments. Patient verbalized her understanding of all education, a HF TOC appointment was scheduled for 07/08/2023 @ 9:30 am.   ECHO/ LVEF: 30-35%  Clinical Course:  Past Medical History:  Diagnosis Date   Asthma    Hypertension      Social History   Socioeconomic History   Marital status: Married    Spouse name: Not on file   Number of children: Not on file   Years of education: Not on file   Highest education level: Not on file  Occupational History   Not on file  Tobacco Use   Smoking status: Former    Types: Cigarettes    Start date: 2024   Smokeless tobacco: Never  Vaping Use   Vaping status: Former  Substance and Sexual Activity   Alcohol use: No   Drug use: Never   Sexual activity: Not on file  Other Topics Concern   Not on file  Social History Narrative   Not on file   Social Drivers of Health   Financial Resource Strain: Not on file  Food Insecurity: No Food Insecurity (06/24/2023)   Hunger Vital Sign    Worried About Running Out of Food in the Last Year:  Never true    Ran Out of Food in the Last Year: Never true  Transportation Needs: No Transportation Needs (06/25/2023)   PRAPARE - Administrator, Civil Service (Medical): No    Lack of Transportation (Non-Medical): No  Physical Activity: Not on file  Stress: Not on file  Social Connections: Not on file   Education Assessment and Provision:  Detailed education and instructions provided on heart failure disease management including the following:  Signs and symptoms of Heart Failure When to call the physician Importance of daily weights Low sodium diet Fluid restriction Medication management Anticipated future follow-up appointments  Patient education given on each of the above topics.  Patient acknowledges understanding via teach back method and acceptance of all instructions.  Education Materials:  "Living Better With Heart Failure" Booklet, HF zone tool, & Daily Weight Tracker Tool.  Patient has scale at home: No, one was provided to her at discharge Patient has pill box at home: No, one was provided to her at discharge    High Risk Criteria for Readmission and/or Poor Patient Outcomes: Heart failure hospital admissions (last 6 months): 0  No Show rate: 0 Difficult social situation: yes, currently living in a hotel with her son.  Demonstrates medication adherence: yes Primary Language: English Literacy level: reading, writing, and comprehension  Barriers of Care:   SDOH  needs ( hotel  Diet/ fluid restrictions/ daily weights  Considerations/Referrals:   Referral made to Heart Failure Pharmacist Stewardship: yes Referral made to Heart Failure CSW/NCM TOC: yes Referral made to Heart & Vascular TOC clinic: yes, 07/08/2023 @ 9:30 am  Items for Follow-up on DC/TOC: Continued HF education Diet/ fluid restrictions Daily weights    Randie Bustle, BSN, RN Heart Failure Teacher, adult education Only

## 2023-06-27 NOTE — Progress Notes (Addendum)
   Heart Failure Stewardship Pharmacist Progress Note   PCP: Patient, No Pcp Per PCP-Cardiologist: Sunit Tolia, DO    HPI:  43 yo F with PMH of asthma and HTN.  Presented to the ED on 5/8 with AMS and knee pain from a fall 1 month prior. She was febrile, lactic acid >2, tachycardic, and R knee looked infectious appearing. Admitted for sepsis due to cellulitis vs septic arthritis. Ortho consulted and taken to the OR on 5/9 for exploration. Blood cultures positive for strep pyogenes. ECHO 5/9 showed LVEF 30-35%, global hypokinesis, RV normal, mild MR, no vegetations. Cardiology was consulted. LV dysfunction thought to be more related to sepsis. Started on IV lasix (volume overloaded due to R knee interventions + third spacing) and GDMT with metoprolol and losartan.    Denies shortness of breath. Still with LE edema on R leg due to injury but this is improving. Most of the swelling is around the foot and ankle now. Patient is ok with getting compression stockings to assist with edema. Her left leg does not have any swelling. Denies lightheadedness or dizziness. Reviewed changes to GDMT. Provided smoking cessation. She is ready to quit and thinks the hospitalization has helped prevent any withdrawal cravings. She lives in a motel and is unfortunately around other smokers.   Current HF Medications: Diuretic: furosemide 20 mg IV daily Beta Blocker: metoprolol XL 25 mg daily MRA: spironolactone 12.5 mg daily  Prior to admission HF Medications: None  Pertinent Lab Values: Serum creatinine 1.22, BUN 8, Potassium 3.7, Sodium 139, Magnesium  1.9, A1c 5.5   Vital Signs: Weight: 300 lbs (admission weight: 297 lbs) Blood pressure: 100/70s  Heart rate: 80s  I/O: incomplete  Medication Assistance / Insurance Benefits Check: Does the patient have prescription insurance?  Yes Type of insurance plan: [place] [employer]  Outpatient Pharmacy:  Prior to admission [facility] Is the patient  willing to use [facility] TOC pharmacy at discharge? Yes Is the patient willing to transition their outpatient pharmacy to utilize a [facility] outpatient pharmacy?   No    Assessment: 1. 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 NYHA class II symptoms. - Continue furosemide 20 mg IV daily. Consider compression stockings. Strict I/Os and daily weights. Keep K>4 and Mg>2. - Continue metoprolol XL 25 mg daily - Holding losartan 25 mg daily with creatinine bump today - Continue spironolactone 12.5 mg daily   Plan: 1) Medication changes recommended at this time: - Restart losartan pending improvement in creatinine - Add compression stocking  2) Patient assistance: - None pending  3)  Education  - Patient has been educated on current HF medications and potential additions to HF medication regimen - Patient verbalizes understanding that over the next few months, these medication doses may change and more medications may be added to optimize HF regimen - Patient has been educated on basic disease state pathophysiology and goals of therapy   Jerilyn Monte, PharmD, BCPS Heart Failure Stewardship Pharmacist Phone [phone]

## 2023-06-27 NOTE — Plan of Care (Signed)
  Problem: Education: Goal: Ability to describe self-care measures that may prevent or decrease complications (Diabetes Survival Skills Education) will improve Outcome: Progressing Goal: Individualized Educational Video(s) Outcome: Progressing   Problem: Coping: Goal: Ability to adjust to condition or change in health will improve Outcome: Progressing   Problem: Fluid Volume: Goal: Ability to maintain a balanced intake and output will improve Outcome: Progressing   Problem: Health Behavior/Discharge Planning: Goal: Ability to identify and utilize available resources and services will improve Outcome: Progressing Goal: Ability to manage health-related needs will improve Outcome: Progressing   Problem: Skin Integrity: Goal: Risk for impaired skin integrity will decrease Outcome: Progressing   

## 2023-06-28 DIAGNOSIS — M71061 Abscess of bursa, right knee: Secondary | ICD-10-CM | POA: Diagnosis not present

## 2023-06-28 LAB — BASIC METABOLIC PANEL WITH GFR
Anion gap: 9 (ref 5–15)
BUN: 7 mg/dL (ref 6–20)
CO2: 23 mmol/L (ref 22–32)
Calcium: 8.5 mg/dL — ABNORMAL LOW (ref 8.9–10.3)
Chloride: 107 mmol/L (ref 98–111)
Creatinine, Ser: 1.18 mg/dL — ABNORMAL HIGH (ref 0.44–1.00)
GFR, Estimated: 60 mL/min — ABNORMAL LOW (ref >60)
Glucose, Bld: 93 mg/dL (ref 70–99)
Potassium: 3.7 mmol/L (ref 3.5–5.1)
Sodium: 139 mmol/L (ref 135–145)

## 2023-06-28 LAB — CBC
HCT: 29.6 % — ABNORMAL LOW (ref 36.0–46.0)
Hemoglobin: 8.5 g/dL — ABNORMAL LOW (ref 12.0–15.0)
MCH: 20 pg — ABNORMAL LOW (ref 26.0–34.0)
MCHC: 28.7 g/dL — ABNORMAL LOW (ref 30.0–36.0)
MCV: 69.5 fL — ABNORMAL LOW (ref 80.0–100.0)
Platelets: 569 10*3/uL — ABNORMAL HIGH (ref 150–400)
RBC: 4.26 MIL/uL (ref 3.87–5.11)
RDW: 19.6 % — ABNORMAL HIGH (ref 11.5–15.5)
WBC: 7.1 10*3/uL (ref 4.0–10.5)
nRBC: 0.6 % — ABNORMAL HIGH (ref 0.0–0.2)

## 2023-06-28 LAB — TYPE AND SCREEN
ABO/RH(D): O POS
Antibody Screen: NEGATIVE
Unit division: 0

## 2023-06-28 LAB — BPAM RBC
Blood Product Expiration Date: 202506102359
ISSUE DATE / TIME: 202505161624
Unit Type and Rh: 5100

## 2023-06-28 LAB — GLUCOSE, CAPILLARY
Glucose-Capillary: 88 mg/dL (ref 70–99)
Glucose-Capillary: 120 mg/dL — ABNORMAL HIGH (ref 70–99)
Glucose-Capillary: 86 mg/dL (ref 70–99)
Glucose-Capillary: 87 mg/dL (ref 70–99)

## 2023-06-28 NOTE — Progress Notes (Signed)
 Mobility Specialist: Progress Note   06/28/23 1610  Mobility  Activity Ambulated with assistance in hallway  Level of Assistance Standby assist, set-up cues, supervision of patient - no hands on  Assistive Device None  Distance Ambulated (ft) 300 ft  RLE Weight Bearing Per Provider Order WBAT  Activity Response Tolerated well  Mobility Referral Yes  Mobility visit 1 Mobility  Mobility Specialist Start Time (ACUTE ONLY) 1552  Mobility Specialist Stop Time (ACUTE ONLY) 1607  Mobility Specialist Time Calculation (min) (ACUTE ONLY) 15 min    Pt was agreeable to mobility session - received in bed. ModI for bed mobility. SV for ambulation. Ambulated to the BR first then proceeded with hallway ambulation. C/o RLE swelling and pain rated 10/10. Took 1x seated break d/t fatigue and pain. Returned to room without fault. Left in bed with all needs met, call bell in reach.   Deloria Fetch Mobility Specialist Please contact via SecureChat or Rehab office at [phone]

## 2023-06-28 NOTE — Progress Notes (Signed)
 HEART & VASCULAR TRANSITION OF CARE CONSULT NOTE    Referring Physician: Dr. Albert Huff PCP: Dr. Danella Dunn Cardiologist: Dr. Albert Huff HF Cardiologist: assign to Dr. Julane Ny  HPI: Referred to clinic by Dr. Albert Huff for heart failure consultation.   Roberta Soto is a 43 y.o. female with a past history of asthma, arthritis, and newly diagnosed systolic heart failure.  Admitted 5/25 with sepsis 2/2 to R leg cellulitis/knee bursitis. Ortho consulted, and taken to the OR for exploration. BCx + for strep pyogenes.  Echo showed EF 30-35%, RV normal, mild MR, no vegetations. ID consulted for abx, no indication for TEE. Cards consulted, felt reduced EF 2/2 sepsis. GDMT titrated and she was discharged home, weight 299 lbs.  Today she presents to [facility] for post hospital follow up. Overall feeling fair. She is SOB walking with her walker on flat ground. Has SOB with ADLs. Has back pain, occasional dizziness. Denies palpitations, abnormal bleeding, CP, edema, or PND/Orthopnea. Appetite ok.  Weight at home 252 pounds. Taking all medications. Smokes 2-3 cigs/day, no ETOH or drugs. Tubes are tied. Has 4 children/3 living (24, 10, 8). Staying in extended stay motel in [place] with son, she may be moving back to [place] to live with her mother.  Family Hx: mother has enlarged heart, sister "abnormal heart beat", maternal aunt with PPM  Social Hx: Lives in extended stay motel with son, from [place], 45 year old son died 13 years ago from asthma attack, she has transportation and is insured  Cardiac Testing  - Echo 5/25: EF 30-35%, RV normal, mild MR, no vegetation.   Past Medical History:  Diagnosis Date   Asthma    Hypertension    Current Outpatient Medications  Medication Sig Dispense Refill   albuterol  (PROVENTIL ) (2.5 MG/3ML) 0.083% nebulizer solution Take 3 mLs (2.5 mg total) by nebulization every 6 (six) hours as needed for wheezing or shortness of breath. 75 mL 1   albuterol  (VENTOLIN  HFA) 108 (90 Base) MCG/ACT  inhaler Inhale 2 puffs into the lungs every 4 (four) hours as needed for wheezing or shortness of breath. 8 g 0   budesonide -formoterol  (SYMBICORT ) 160-4.5 MCG/ACT inhaler Inhale 2 puffs into the lungs 2 (two) times daily. 1 Inhaler 12   clonazePAM  (KLONOPIN ) 1 MG tablet Take 1 mg by mouth 3 (three) times daily.     [facility] MAGNESIUM  OXIDE 250 MG TABS Take 1 tablet by mouth daily.     cyanocobalamin  1000 MCG tablet Take 1 tablet (1,000 mcg total) by mouth daily.     cyclobenzaprine  (FLEXERIL ) 10 MG tablet Take 1 tablet (10 mg total) by mouth 3 (three) times daily as needed for muscle spasms. 30 tablet 0   escitalopram  (LEXAPRO ) 10 MG tablet Take 10 mg by mouth daily.     ferrous sulfate  325 (65 FE) MG EC tablet Take 1 tablet (325 mg total) by mouth daily. 60 tablet 3   furosemide  (LASIX ) 20 MG tablet Take 1 tablet (20 mg total) by mouth daily. 30 tablet 11   gabapentin  (NEURONTIN ) 400 MG capsule Take 400 mg by mouth 3 (three) times daily.     hydrOXYzine (VISTARIL) 50 MG capsule Take 50 mg by mouth 3 (three) times daily.     ipratropium-albuterol  (DUONEB) 0.5-2.5 (3) MG/3ML SOLN Take 3 mLs by nebulization every 4 (four) hours as needed. 360 mL 3   levocetirizine (XYZAL ) 5 MG tablet Take 1 tablet (5 mg total) by mouth every evening. 90 tablet 3   linezolid  (  ZYVOX ) 600 MG tablet Take 1 tablet (600 mg total) by mouth every 12 (twelve) hours for 35 doses. 35 tablet 0   losartan  (COZAAR ) 25 MG tablet Take 1 tablet (25 mg total) by mouth daily. 90 tablet 2   metoprolol  succinate (TOPROL -XL) 25 MG 24 hr tablet Take 1 tablet (25 mg total) by mouth daily. 90 tablet 2   oxyCODONE  (ROXICODONE ) 5 MG immediate release tablet Take 1 tablet (5 mg total) by mouth every 6 (six) hours as needed for severe pain (pain score 7-10). 20 tablet 0   spironolactone  (ALDACTONE ) 25 MG tablet Take 0.5 tablets (12.5 mg total) by mouth daily. 45 tablet 2   No current facility-administered medications for this encounter.    Allergies  Allergen Reactions   Fruit & Vegetable Daily [Nutritional Supplements] Other (See Comments)    Unknown reaction   Latex Rash   Social History   Socioeconomic History   Marital status: Single    Spouse name: Not on file   Number of children: 3   Years of education: Not on file   Highest education level: High school graduate  Occupational History   Occupation: Not working  Tobacco Use   Smoking status: Former    Types: Cigarettes    Start date: 2024   Smokeless tobacco: Never  Vaping Use   Vaping status: Former  Substance and Sexual Activity   Alcohol use: No   Drug use: Never   Sexual activity: Not on file  Other Topics Concern   Not on file  Social History Narrative   Not on file   Social Drivers of Health   Financial Resource Strain: Low Risk  (06/27/2023)   Overall Financial Resource Strain (CARDIA)    Difficulty of Paying Living Expenses: Not very hard  Food Insecurity: No Food Insecurity (06/24/2023)   Hunger Vital Sign    Worried About Running Out of Food in the Last Year: Never true    Ran Out of Food in the Last Year: Never true  Transportation Needs: No Transportation Needs (06/27/2023)   PRAPARE - Administrator, Civil Service (Medical): No    Lack of Transportation (Non-Medical): No  Physical Activity: Not on file  Stress: Not on file  Social Connections: Not on file  Intimate Partner Violence: Not At Risk (06/25/2023)   Humiliation, Afraid, Rape, and Kick questionnaire    Fear of Current or Ex-Partner: No    Emotionally Abused: No    Physically Abused: No    Sexually Abused: No   Family History  Problem Relation Age of Onset   Cancer Mother    Hypertension Mother    Cancer Brother    Healthy Father    [place] Readings from Last 3 Encounters:  07/08/23 114.7 kg (252 lb 12.8 oz)  06/29/23 135.4 kg (298 lb 8.1 oz)  10/21/22 115.2 kg (254 lb)   BP 110/70   Pulse (!) 104   Wt 114.7 kg (252 lb 12.8 oz)   SpO2 97%   BMI 39.59  kg/m   PHYSICAL EXAM: General:  NAD. No resp difficulty, walked into clinic with rolling walker. HEENT: Normal Neck: Supple. No JVD. Cor: Regular rate & rhythm. No rubs, gallops or murmurs. Lungs: Clear Abdomen: Soft, obese, nontender, nondistended.  Extremities: No cyanosis, clubbing, rash, edema Neuro: Alert & oriented x 3, moves all 4 extremities w/o difficulty. Affect pleasant.  ECG (personally reviewed): NSR 89 bpm  ASSESSMENT & PLAN: Chronic Systolic Heart Failure - New diagnosis  in setting of septic knee 5/25 - Echo 5/25: EF 30-35%, RV ok - No Family Hx, no CP, no ischemic eval but has RFs (smoking, was told she was diabetic in the past) - NYHA II-early III, functional status confounded by knee immobility/pain, volume OK today. - Stop losartan . - Start Entresto  24/26 mg bid. - Change Lasix  to 20 mg PRN. - Continue spiro 12.5 mg daily. - Continue Toprol  XL 25 mg daily. - Has had UTIs in recent past, hold off on SGLT2i for now. A1C 5.5 - Discussed fetotoxicity of meds, she is s/p tubal ligation. - Dr. Julane Ny performed bedside echo today, EF remains 30-35%, findings suspicious for LV non-compaction. Will arrange cMRI to further evaluate. - Labs today.  2. HTN - BP well-controlled - Med changes as above - She has BP cuff at home, I asked her to check BP daily and log  3. Tobacco use - Smoking 2-3 cigs/day - Discussed cessation  4. Septic prepatellar bursitis - has follow up with Ortho, Dr Christiane Cowing - On linezolid  until 07/17/23 - HH with [facility]  NYHA II-early III GDMT  Diuretic: change Lasix  20 mg to PRN BB: Toprol  XL 25 mg daily Ace/ARB/ARNI: stop losartan , start Entresto  24/26 mg bid MRA: spiro 12.5 mg daily SGLT2i: not yet  Referred to HFSW (PCP, Medications, Transportation, ETOH Abuse, Drug Abuse, Insurance, Surveyor, quantity ): No Refer to Pharmacy: No Refer to Home Health: No, established with [facility] Refer to Advanced Heart Failure Clinic: Yes, assign to Dr  Julane Ny Refer to General Cardiology: No, shared with Dr. Albert Huff  Follow up with Gen Cards, as scheduled.   Refer to AHF clinic and follow up in 2 months with Dr. Lynnwood Sauer, FNP-BC 07/08/23

## 2023-06-28 NOTE — Progress Notes (Signed)
 PROGRESS NOTE    Roberta Soto  ZOX:[id number] DOB: 01-02-1981 DOA: 06/19/2023 PCP: Patient, No Pcp Per   Brief Narrative: 43 y.o. F with PMH significant for asthma and HTN who presented to the ED with confusion and R knee pain. Patient was noted to be quite agitated and tachycardic. She was initially admitted to the ICU. There was concern for right knee infection. Orthopedics was consulted.  Patient has been seen in consultation by PCCM Ortho infectious disease and cardiology.  Assessment & Plan:   Principal Problem:   Abscess of bursa of right knee Active Problems:   Sepsis  AMS  Hemodynamic instability   Chronic health problem   AMS (altered mental status)   Asymptomatic LV dysfunction   Benign hypertension   Class 3 severe obesity due to excess calories with serious comorbidity and body mass index (BMI) of 40.0 to 44.9 in adult   Sepsis due to Streptococcus pyogenes (HCC)   Cardiomyopathy (HCC)   Strep pyogenes bacteremia/right leg cellulitis/right knee bursitis/sepsis present on admission status post irrigation and debridement of the right knee Patient had sepsis physiology at admission.   Blood culture from 5/8 is growing strep viridans.  Strep pyogenes also noted in right knee aspirate as well as aspirate from the prepatellar bursa. Repeat blood culture sent on 5/10 without any growth so far. Infectious diseases signed off on May 15.  Orthopedics is following.  Ortho cleared for discharge.  Patient initially was also on Rocephin  and Zyvox  was added by ID with improvement in leukocytosis and lower extremity erythema and tenderness and swelling.  Rocephin  stopped on May 15.  ID recommends to continue Zyvox  for an additional 3 weeks of Zyvox  600 mg p.o. twice daily. CT Right ankle- Generalized nonspecific circumferential subcutaneous edema of the visualized right lower extremity extending through the ankle and into the dorsal foot, concerning for cellulitis in the  appropriate setting. No loculated fluid collection. No soft tissue gas.No acute osseous abnormality. Mild degenerative changes at the talonavicular joint.  Discharge home on May 18 on p.o. antibiotics As she feels she needs another day to put things together prior to discharge and to have someone pick her up.   CT of the right lower extremity - Status post incision and drainage of the right knee prepatellar bursa with ill-defined region of hypodense fluid of indeterminate sterility in the prepatellar soft tissues measuring approximately 3.3 x 1.7 x 4.1 cm with foci of soft tissue air along the superficial margin of the collection, just below the level of the skin surface. Organizing phlegmon/early abscess formation can not be excluded. Surrounding subcutaneous edema and cutaneous thickening is concerning for cellulitis. Small right knee joint effusion Venous Doppler showed no evidence of DVT Continue with narcotics and gabapentin .   Microcytic anemia/B12 deficiency Hemoglobin was 8.8 on 5/9.  Hemoglobin noted to be 7.9.  Drop in hemoglobin is likely dilutional. Anemia panel shows ferritin of 24, iron  10, TIBC 398, percent saturation 3.  Folic acid 9.9.  Vitamin B12 269. Will benefit from iron  supplementation at discharge.  Status post 1 unit of Venofer  and packed RBC on May 16.  Continue B12 supplementation.   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  Seen by cardiology who think this is likely due to severe infection and sepsis.   Management per cardiology.  Patient was started on losartan  and metoprolol  by cardiology.   Acute kidney injury/hypokalemia Came in with creatinine of 1.51.  Creatinine was 0.86 in September 2024.  Cr trending up with ace lasix  aldactone  Holding cozaar   Will restart on dc   History of anxiety Noted to be on Klonopin  1 mg 3 times a day.  This is a chronic home medication for her.  Verified on PDMP database.  Dose decreased due to excessive somnolence.  The  rationale for this was explained to the patient and she understands.   Obesity Estimated body mass index is 45.82 kg/m as calculated from the following:   Height as of this encounter: 5\' 7"  (1.702 m).   Weight as of this encounter: 132.7 kg.   Estimated body mass index is 46.89 kg/m as calculated from the following:   Height as of this encounter: 5\' 7"  (1.702 m).   Weight as of this encounter: 135.8 kg.  DVT prophylaxis: lovenox  Code Status: full Family Communication:none) Disposition Plan:  Status is: Inpatient Remains inpatient appropriate because: acute illness   Consultants: Infectious disease, cardiology, Ortho, PCCM  Procedures:  Antimicrobials: Zyvox  Subjective: Creatinine 1.22 from 0.93 Feels improved walking with mobility specialist.   Does not want to be discharged would like to wait for her swelling to go away in her right leg prior to discharge  Objective: Vitals:   06/27/23 1950 06/28/23 0417 06/28/23 0500 06/28/23 0756  BP: 116/85 107/88  114/76  Pulse: 94 84  92  Resp: 17 16  18   Temp: 99.1 F (37.3 C) (!) 97.4 F (36.3 C)  97.8 F (36.6 C)  TempSrc: Oral     SpO2: 98% 98%  96%  Weight:   135.8 kg   Height:        Intake/Output Summary (Last 24 hours) at 06/28/2023 1055 Last data filed at 06/27/2023 1930 Gross per 24 hour  Intake 315 ml  Output --  Net 315 ml    Filed Weights   06/26/23 0257 06/27/23 0444 06/28/23 0500  Weight: 133.3 kg (!) 136.1 kg 135.8 kg    Examination:  General exam: Appears in no acute distress  respiratory system: Clear to auscultation. Respiratory effort normal. Cardiovascular system: S1 & S2 heard, RRR. No JVD, murmurs, rubs, gallops or clicks.  Right lower extremity edema Gastrointestinal system: Abdomen is nondistended, soft and nontender. No organomegaly or masses felt. Normal bowel sounds heard. Central nervous system: Alert and oriented. No focal neurological deficits. Extremities: Right leg edema erythema  from right foot to right upper thighs   Data Reviewed: I have personally reviewed following labs and imaging studies  CBC: Recent Labs  Lab 06/22/23 1031 06/23/23 0508 06/24/23 0658 06/25/23 0312 06/26/23 0500 06/27/23 0030  WBC 31.7* 21.3* 18.2* 12.6* 9.1 8.2  NEUTROABS 27.6*  --   --   --   --   --   HGB 7.9* 7.9* 8.0* 7.6* 7.9* 7.4*  HCT 28.0* 27.7* 28.8* 27.7* 28.4* 27.4*  MCV 68.6* 68.2* 68.4* 70.3* 70.0* 70.4*  PLT 386 402* 458* 472* 523* 539*   Basic Metabolic Panel: Recent Labs  Lab 06/23/23 0508 06/24/23 0658 06/25/23 0312 06/26/23 0500 06/27/23 0030  NA 139 137 138 139 139  K 3.5 3.7 3.4* 3.6 3.7  CL 106 106 106 106 106  CO2 23 23 24 23 26   GLUCOSE 94 89 104* 112* 100*  BUN 9 8 9 6 8   CREATININE 0.96 0.94 0.98 0.93 1.22*  CALCIUM  8.2* 8.3* 8.2* 8.2* 8.2*  MG 1.9  --   --   --   --    GFR: Estimated Creatinine Clearance: 87.5 mL/min (A) (by C-G  formula based on SCr of 1.22 mg/dL (H)). Liver Function Tests: No results for input(s): "AST", "ALT", "ALKPHOS", "BILITOT", "PROT", "ALBUMIN" in the last 168 hours.  No results for input(s): "LIPASE", "AMYLASE" in the last 168 hours. No results for input(s): "AMMONIA" in the last 168 hours. Coagulation Profile: No results for input(s): "INR", "PROTIME" in the last 168 hours.  Cardiac Enzymes: No results for input(s): "CKTOTAL", "CKMB", "CKMBINDEX", "TROPONINI" in the last 168 hours. BNP (last 3 results) No results for input(s): "PROBNP" in the last 8760 hours. HbA1C: No results for input(s): "HGBA1C" in the last 72 hours. CBG: Recent Labs  Lab 06/27/23 0644 06/27/23 1110 06/27/23 1651 06/27/23 2204 06/28/23 0620  GLUCAP 95 123* 95 91 88   Lipid Profile: No results for input(s): "CHOL", "HDL", "LDLCALC", "TRIG", "CHOLHDL", "LDLDIRECT" in the last 72 hours.  Thyroid Function Tests: No results for input(s): "TSH", "T4TOTAL", "FREET4", "T3FREE", "THYROIDAB" in the last 72 hours. Anemia Panel: No  results for input(s): "VITAMINB12", "FOLATE", "FERRITIN", "TIBC", "IRON ", "RETICCTPCT" in the last 72 hours. Sepsis Labs: No results for input(s): "PROCALCITON", "LATICACIDVEN" in the last 168 hours.   Recent Results (from the past 240 hours)  Blood Culture (routine x 2)     Status: None   Collection Time: 06/19/23 12:09 PM   Specimen: BLOOD LEFT ARM  Result Value Ref Range Status   Specimen Description BLOOD LEFT ARM  Final   Special Requests   Final    AEROBIC BOTTLE ONLY Blood Culture results may not be optimal due to an inadequate volume of blood received in culture bottles   Culture   Final    NO GROWTH 5 DAYS Performed at [facility], [address] [place], [place] [address]    Report Status 06/24/2023 FINAL  Final  Resp panel by RT-PCR (RSV, Flu A&B, Covid) Anterior Nasal Swab     Status: None   Collection Time: 06/19/23 12:10 PM   Specimen: Anterior Nasal Swab  Result Value Ref Range Status   SARS Coronavirus 2 by RT PCR NEGATIVE NEGATIVE Final   Influenza A by PCR NEGATIVE NEGATIVE Final   Influenza B by PCR NEGATIVE NEGATIVE Final    Comment: (NOTE) The Xpert Xpress SARS-CoV-2/FLU/RSV plus assay is intended as an aid in the diagnosis of influenza from Nasopharyngeal swab specimens and should not be used as a sole basis for treatment. Nasal washings and aspirates are unacceptable for Xpert Xpress SARS-CoV-2/FLU/RSV testing.  Fact Sheet for Patients: BloggerCourse.com  Fact Sheet for Healthcare Providers: SeriousBroker.it  This test is not yet approved or cleared by the [place]  FDA and has been authorized for detection and/or diagnosis of SARS-CoV-2 by FDA under an Emergency Use Authorization (EUA). This EUA will remain in effect (meaning this test can be used) for the duration of the COVID-19 declaration under Section 564(b)(1) of the Act, 21 U.S.C. section 360bbb-3(b)(1), unless the authorization is  terminated or revoked.     Resp Syncytial Virus by PCR NEGATIVE NEGATIVE Final    Comment: (NOTE) Fact Sheet for Patients: BloggerCourse.com  Fact Sheet for Healthcare Providers: SeriousBroker.it  This test is not yet approved or cleared by the [place]  FDA and has been authorized for detection and/or diagnosis of SARS-CoV-2 by FDA under an Emergency Use Authorization (EUA). This EUA will remain in effect (meaning this test can be used) for the duration of the COVID-19 declaration under Section 564(b)(1) of the Act, 21 U.S.C. section 360bbb-3(b)(1), unless the authorization is terminated or revoked.  Performed at [facility], [address] [place], [place] [address]   Blood Culture (routine x 2)     Status: Abnormal   Collection Time: 06/19/23 12:14 PM   Specimen: BLOOD LEFT ARM  Result Value Ref Range Status   Specimen Description BLOOD LEFT ARM  Final   Special Requests   Final    BOTTLES DRAWN AEROBIC AND ANAEROBIC Blood Culture results may not be optimal due to an inadequate volume of blood received in culture bottles   Culture  Setup Time   Final    GRAM POSITIVE COCCI ANAEROBIC BOTTLE ONLY CRITICAL RESULT CALLED TO, READ BACK BY AND VERIFIED WITH: J [facility]  06/20/23 MK Performed at [facility], [address] [place], [place] [address]    Culture VIRIDANS STREPTOCOCCUS (A)  Final   Report Status 06/22/2023 FINAL  Final   Organism ID, Bacteria VIRIDANS STREPTOCOCCUS  Final      Susceptibility   Viridans streptococcus - MIC*    PENICILLIN  0.25 INTERMEDIATE Intermediate     CEFTRIAXONE  0.25 SENSITIVE Sensitive     ERYTHROMYCIN <=0.12 SENSITIVE Sensitive     LEVOFLOXACIN 1 SENSITIVE Sensitive     VANCOMYCIN  0.5 SENSITIVE Sensitive     * VIRIDANS STREPTOCOCCUS  Blood Culture ID Panel (Reflexed)     Status: Abnormal   Collection Time: 06/19/23 12:14 PM  Result Value Ref Range Status    Enterococcus faecalis NOT DETECTED NOT DETECTED Final   Enterococcus Faecium NOT DETECTED NOT DETECTED Final   Listeria monocytogenes NOT DETECTED NOT DETECTED Final   Staphylococcus species NOT DETECTED NOT DETECTED Final   Staphylococcus aureus (BCID) NOT DETECTED NOT DETECTED Final   Staphylococcus epidermidis NOT DETECTED NOT DETECTED Final   Staphylococcus lugdunensis NOT DETECTED NOT DETECTED Final   Streptococcus species DETECTED (A) NOT DETECTED Final    Comment: Not Enterococcus species, Streptococcus agalactiae, Streptococcus pyogenes, or Streptococcus pneumoniae. CRITICAL RESULT CALLED TO, READ BACK BY AND VERIFIED WITH: J WYLAND,[email or web address]  06/20/23 MK    Streptococcus agalactiae NOT DETECTED NOT DETECTED Final   Streptococcus pneumoniae NOT DETECTED NOT DETECTED Final   Streptococcus pyogenes NOT DETECTED NOT DETECTED Final   A.calcoaceticus-baumannii NOT DETECTED NOT DETECTED Final   Bacteroides fragilis NOT DETECTED NOT DETECTED Final   Enterobacterales NOT DETECTED NOT DETECTED Final   Enterobacter cloacae complex NOT DETECTED NOT DETECTED Final   Escherichia coli NOT DETECTED NOT DETECTED Final   Klebsiella aerogenes NOT DETECTED NOT DETECTED Final   Klebsiella oxytoca NOT DETECTED NOT DETECTED Final   Klebsiella pneumoniae NOT DETECTED NOT DETECTED Final   Proteus species NOT DETECTED NOT DETECTED Final   Salmonella species NOT DETECTED NOT DETECTED Final   Serratia marcescens NOT DETECTED NOT DETECTED Final   Haemophilus influenzae NOT DETECTED NOT DETECTED Final   Neisseria meningitidis NOT DETECTED NOT DETECTED Final   Pseudomonas aeruginosa NOT DETECTED NOT DETECTED Final   Stenotrophomonas maltophilia NOT DETECTED NOT DETECTED Final   Candida albicans NOT DETECTED NOT DETECTED Final   Candida auris NOT DETECTED NOT DETECTED Final   Candida glabrata NOT DETECTED NOT DETECTED Final   Candida krusei NOT DETECTED NOT DETECTED Final   Candida parapsilosis NOT  DETECTED NOT DETECTED Final   Candida tropicalis NOT DETECTED NOT DETECTED Final   Cryptococcus neoformans/gattii NOT DETECTED NOT DETECTED Final    Comment: Performed at [facility], [address], [place], [place] [address]  Body fluid culture w Gram Stain     Status: None  Collection Time: 06/19/23  1:02 PM   Specimen: Body Fluid  Result Value Ref Range Status   Specimen Description FLUID  Final   Special Requests RIGHT KNEE  Final   Gram Stain   Final    RARE WBC PRESENT,BOTH PMN AND MONONUCLEAR NO ORGANISMS SEEN    Culture   Final    RARE STREPTOCOCCUS PYOGENES Beta hemolytic streptococci are predictably susceptible to penicillin  and other beta lactams. Susceptibility testing not routinely performed. Performed at [facility], [address] [place], [place] [address]    Report Status 06/21/2023 FINAL  Final  MRSA Next Gen by PCR, Nasal     Status: None   Collection Time: 06/19/23  5:56 PM   Specimen: Nasal Mucosa; Nasal Swab  Result Value Ref Range Status   MRSA by PCR Next Gen NOT DETECTED NOT DETECTED Final    Comment: (NOTE) The GeneXpert MRSA Assay (FDA approved for NASAL specimens only), is one component of a comprehensive MRSA colonization surveillance program. It is not intended to diagnose MRSA infection nor to guide or monitor treatment for MRSA infections. Test performance is not FDA approved in patients less than 8 years old. Performed at [facility], [address], [place], [place] [address]   Aerobic/Anaerobic Culture w Gram Stain (surgical/deep wound)     Status: None   Collection Time: 06/20/23  1:29 PM   Specimen: Wound  Result Value Ref Range Status   Specimen Description WOUND  Final   Special Requests RIGHT PREPATELLAR BURSA  Final   Gram Stain NO WBC SEEN GRAM POSITIVE COCCI IN PAIRS   Final   Culture   Final    RARE GROUP A STREP (S.PYOGENES) ISOLATED Beta hemolytic streptococci are predictably susceptible to penicillin   and other beta lactams. Susceptibility testing not routinely performed. NO ANAEROBES ISOLATED Performed at [facility], [address] [place], [place] [address]    Report Status 06/25/2023 FINAL  Final  Aerobic/Anaerobic Culture w Gram Stain (surgical/deep wound)     Status: None   Collection Time: 06/20/23  1:34 PM   Specimen: Wound; Body Fluid  Result Value Ref Range Status   Specimen Description WOUND  Final   Special Requests RIGHT KNEE ASPIRATION  Final   Gram Stain NO WBC SEEN NO ORGANISMS SEEN   Final   Culture   Final    No growth aerobically or anaerobically. Performed at [facility], [address] [place], [place] [address]    Report Status 06/25/2023 FINAL  Final  Culture, blood (Routine X 2) w Reflex to ID Panel     Status: None   Collection Time: 06/21/23 10:22 AM   Specimen: BLOOD LEFT HAND  Result Value Ref Range Status   Specimen Description BLOOD LEFT HAND  Final   Special Requests   Final    BOTTLES DRAWN AEROBIC AND ANAEROBIC Blood Culture results may not be optimal due to an inadequate volume of blood received in culture bottles   Culture   Final    NO GROWTH 5 DAYS Performed at [facility], [address] [place], [place] [address]    Report Status 06/26/2023 FINAL  Final  Culture, blood (Routine X 2) w Reflex to ID Panel     Status: None   Collection Time: 06/21/23 10:28 AM   Specimen: BLOOD RIGHT HAND  Result Value Ref Range Status   Specimen Description BLOOD RIGHT HAND  Final   Special Requests  Final    BOTTLES DRAWN AEROBIC AND ANAEROBIC Blood Culture results may not be optimal due to an inadequate volume of blood received in culture bottles   Culture   Final    NO GROWTH 5 DAYS Performed at [facility], [address] [place], [place] [address]    Report Status 06/26/2023 FINAL  Final         Radiology Studies: No results found.       Scheduled Meds:  acetaminophen   650 mg Oral Q6H   clonazePAM    0.5 mg Oral TID   vitamin B-12  1,000 mcg Oral Daily   enoxaparin  (LOVENOX ) injection  60 mg Subcutaneous Q24H   escitalopram   10 mg Oral Daily   fluticasone  furoate-vilanterol  1 puff Inhalation Daily   furosemide   20 mg Intravenous Daily   gabapentin   400 mg Oral TID   insulin  aspart  0-5 Units Subcutaneous QHS   insulin  aspart  0-9 Units Subcutaneous TID WC   linezolid   600 mg Oral Q12H   metoprolol  succinate  25 mg Oral Daily   polyethylene glycol  17 g Oral Daily   senna-docusate  2 tablet Oral BID   sodium chloride  flush  10-40 mL Intracatheter Q12H   spironolactone   12.5 mg Oral Daily   Continuous Infusions:     LOS: 9 days    Time spent: 39 minutes  Barbee Lew, MD 06/28/2023, 10:55 AM

## 2023-06-28 NOTE — Progress Notes (Signed)
 Physical Therapy Treatment Patient Details Name: Roberta Soto MRN: [id number] DOB: 1980/09/24 Today's Date: 06/28/2023   History of Present Illness Pt is a 43 y/o female admitted with R knee septic bursitis; S/p I&D R knee, WBAT, no ROM restrictions; recovery complicated by new, but asymptomatic LV dysfunction;  has a past medical history of Asthma and Hypertension.    PT Comments  The pt is making great functional progress, now ambulating mod I with her RW. She was able to take several steps without UE support with supervision for safety, but the RW is continuing to assist her in pain management with standing mobility. She does mildly circumduct her R leg during swing phase, likely due to decreased R knee flexion ROM impacting her foot clearance. Educated pt and guided her through self provided AAROM knee flexion exercises. Will continue to follow acutely.    If plan is discharge home, recommend the following: A little help with bathing/dressing/bathroom;Assistance with cooking/housework;Assist for transportation;Help with stairs or ramp for entrance   Can travel by private vehicle        Equipment Recommendations  Rolling walker (2 wheels);BSC/3in1    Recommendations for Other Services       Precautions / Restrictions Precautions Precautions: Fall Recall of Precautions/Restrictions: Intact Restrictions Weight Bearing Restrictions Per Provider Order: Yes RLE Weight Bearing Per Provider Order: Weight bearing as tolerated     Mobility  Bed Mobility Overal bed mobility: Modified Independent Bed Mobility: Supine to Sit     Supine to sit: Modified independent (Device/Increase time), HOB elevated     General bed mobility comments: Pt able to assist her R leg off R EOB, no assistance needed from therapist, HOB elevated    Transfers Overall transfer level: Modified independent Equipment used: Rolling walker (2 wheels) Transfers: Sit to/from Stand Sit to Stand: Modified  independent (Device/Increase time)           General transfer comment: Pt able to transfer to stand from EOB to RW without LOB or assistance.    Ambulation/Gait Ambulation/Gait assistance: Modified independent (Device/Increase time), Supervision Gait Distance (Feet): 340 Feet Assistive device: Rolling walker (2 wheels), None Gait Pattern/deviations: Step-through pattern, Trunk flexed, Decreased stance time - right, Decreased weight shift to right, Antalgic (decreased R knee flexion during swing phase) Gait velocity: reduced Gait velocity interpretation: 1.31 - 2.62 ft/sec, indicative of limited community ambulator   General Gait Details: Pt ambulates steadily without LOB with slow, antalgic step-through gait pattern. She demonstrates mild circumduction of her R hip during swing phase, likely due to noted decreased R knee flexion and thereby foot clearance.  Pt ambulates with a better posture when not using the RW but does manage the pain with weightbearing using the RW. Supervision for safety with several steps without UE support, mod I with RW which was used the majority of the time.   Stairs Stairs:  (pt declined need to practice)           Wheelchair Mobility     Tilt Bed    Modified Rankin (Stroke Patients Only)       Balance Overall balance assessment: Needs assistance Sitting-balance support: Feet supported, No upper extremity supported Sitting balance-Leahy Scale: Normal     Standing balance support: No upper extremity supported, During functional activity, Bilateral upper extremity supported Standing balance-Leahy Scale: Fair Standing balance comment: able to take steps without UE support or LOB but reliant on RW to manage pain  Communication Communication Communication: No apparent difficulties  Cognition Arousal: Alert Behavior During Therapy: WFL for tasks assessed/performed   PT - Cognitive impairments: No  apparent impairments                         Following commands: Intact      Cueing Cueing Techniques: Verbal cues  Exercises Other Exercises Other Exercises: seated self AAROM to R knee into flexion using L ankle to hook and pull on R with R foot on wash cloth, x > 5 reps    General Comments General comments (skin integrity, edema, etc.): educated pt on stretching R knee into flexion to her tolerance and discussed/demonstrated multiple methods to do so supine or sitting EOB      Pertinent Vitals/Pain Pain Assessment Pain Assessment: 0-10 Pain Score:  ("15" when ambulating) Pain Location: R knee Pain Descriptors / Indicators: Grimacing, Guarding Pain Intervention(s): Limited activity within patient's tolerance, Monitored during session, Repositioned, Premedicated before session (pt reports she thinks she got pain meds earlier this AM)    Home Living                          Prior Function            PT Goals (current goals can now be found in the care plan section) Acute Rehab PT Goals Patient Stated Goal: Hopes to get home soon PT Goal Formulation: With patient Time For Goal Achievement: 07/06/23 Potential to Achieve Goals: Good Progress towards PT goals: Progressing toward goals    Frequency    Min 2X/week      PT Plan      Co-evaluation              AM-PAC PT "6 Clicks" Mobility   Outcome Measure  Help needed turning from your back to your side while in a flat bed without using bedrails?: None Help needed moving from lying on your back to sitting on the side of a flat bed without using bedrails?: None Help needed moving to and from a bed to a chair (including a wheelchair)?: None Help needed standing up from a chair using your arms (e.g., wheelchair or bedside chair)?: None Help needed to walk in hospital room?: None Help needed climbing 3-5 steps with a railing? : A Little 6 Click Score: 23    End of Session   Activity  Tolerance: Patient tolerated treatment well Patient left: in bed;with call bell/phone within reach (sitting EOB) Nurse Communication: Mobility status;Other (comment) (no alarm on, pt is mod I) PT Visit Diagnosis: Unsteadiness on feet (R26.81);Other abnormalities of gait and mobility (R26.89);Pain Pain - Right/Left: Right Pain - part of body: Leg     Time: 0926-0939 PT Time Calculation (min) (ACUTE ONLY): 13 min  Charges:    $Gait Training: 8-22 mins PT General Charges $$ ACUTE PT VISIT: 1 Visit                     Vernida Goodie, PT, DPT Acute Rehabilitation Services  Office: [phone]    Ellyn Hack 06/28/2023, 10:28 AM

## 2023-06-29 DIAGNOSIS — M71061 Abscess of bursa, right knee: Secondary | ICD-10-CM | POA: Diagnosis not present

## 2023-06-29 LAB — GLUCOSE, CAPILLARY: Glucose-Capillary: 88 mg/dL (ref 70–99)

## 2023-06-29 MED ORDER — SPIRONOLACTONE 25 MG PO TABS: 12.5000 mg | ORAL_TABLET | Freq: Every day | ORAL | 2 refills | Status: DC

## 2023-06-29 MED ORDER — LOSARTAN POTASSIUM 25 MG PO TABS
25.0000 mg | ORAL_TABLET | Freq: Every day | ORAL | 2 refills | Status: DC
  Filled 2023-06-29: qty 90, 90d supply, fill #0

## 2023-06-29 MED ORDER — FUROSEMIDE 20 MG PO TABS: 20.0000 mg | ORAL_TABLET | Freq: Every day | ORAL | 11 refills | Status: DC

## 2023-06-29 MED ORDER — METOPROLOL SUCCINATE ER 25 MG PO TB24
25.0000 mg | ORAL_TABLET | Freq: Every day | ORAL | 2 refills | Status: DC
  Filled 2023-06-29: qty 90, 90d supply, fill #0

## 2023-06-29 MED ORDER — CYANOCOBALAMIN 1000 MCG PO TABS: 1000.0000 ug | ORAL_TABLET | Freq: Every day | ORAL | Status: AC

## 2023-06-29 MED ORDER — OXYCODONE HCL 5 MG PO TABS
5.0000 mg | ORAL_TABLET | Freq: Four times a day (QID) | ORAL | 0 refills | Status: DC | PRN
  Filled 2023-06-29: qty 20, 5d supply, fill #0

## 2023-06-29 MED ORDER — LINEZOLID 600 MG PO TABS
600.0000 mg | ORAL_TABLET | Freq: Two times a day (BID) | ORAL | 0 refills | Status: DC
Start: 2023-06-29 — End: 2023-06-29
  Filled 2023-06-29: qty 35, 18d supply, fill #0

## 2023-06-29 MED ORDER — FUROSEMIDE 20 MG PO TABS
20.0000 mg | ORAL_TABLET | Freq: Every day | ORAL | 11 refills | Status: DC
  Filled 2023-06-29: qty 30, 30d supply, fill #0

## 2023-06-29 MED ORDER — CYCLOBENZAPRINE HCL 10 MG PO TABS
10.0000 mg | ORAL_TABLET | Freq: Three times a day (TID) | ORAL | 0 refills | Status: DC | PRN
  Filled 2023-06-29: qty 30, 10d supply, fill #0

## 2023-06-29 MED ORDER — SPIRONOLACTONE 25 MG PO TABS
12.5000 mg | ORAL_TABLET | Freq: Every day | ORAL | 2 refills | Status: DC
  Filled 2023-06-29: qty 45, 90d supply, fill #0

## 2023-06-29 MED ORDER — LINEZOLID 600 MG PO TABS: 600.0000 mg | ORAL_TABLET | Freq: Two times a day (BID) | ORAL | 0 refills | Status: AC

## 2023-06-29 MED ORDER — FERROUS SULFATE 325 (65 FE) MG PO TBEC
325.0000 mg | DELAYED_RELEASE_TABLET | Freq: Every day | ORAL | 3 refills | Status: AC
Start: 2023-06-29 — End: 2024-06-28

## 2023-06-29 MED ORDER — METOPROLOL SUCCINATE ER 25 MG PO TB24: 25.0000 mg | ORAL_TABLET | Freq: Every day | ORAL | 2 refills | Status: AC

## 2023-06-29 MED ORDER — OXYCODONE HCL 5 MG PO TABS: 5.0000 mg | ORAL_TABLET | Freq: Four times a day (QID) | ORAL | 0 refills | Status: AC | PRN

## 2023-06-29 MED ORDER — CYCLOBENZAPRINE HCL 10 MG PO TABS: 10.0000 mg | ORAL_TABLET | Freq: Three times a day (TID) | ORAL | 0 refills | Status: AC | PRN

## 2023-06-29 MED ORDER — LOSARTAN POTASSIUM 25 MG PO TABS: 25.0000 mg | ORAL_TABLET | Freq: Every day | ORAL | 2 refills | Status: DC

## 2023-06-29 MED ORDER — FERROUS SULFATE 325 (65 FE) MG PO TBEC
325.0000 mg | DELAYED_RELEASE_TABLET | Freq: Every day | ORAL | 3 refills | Status: DC
  Filled 2023-06-29: qty 60, 60d supply, fill #0

## 2023-06-29 NOTE — Plan of Care (Signed)

## 2023-06-29 NOTE — TOC Transition Note (Signed)
 Transition of Care [facility]) - Discharge Note   Patient Details  Name: Roberta Soto MRN: [id number] Date of Birth: 06/08/1980  [facility] CM/SW Contact:  Jannine Meo, RN Phone Number: 06/29/2023, 10:37 AM   Clinical Narrative:   Patient is being discharged today. Heather with [facility] made aware. Provider aware of HH orders needed.    Final next level of care: Home w Home Health Services Barriers to Discharge: No Barriers Identified   Patient Goals and CMS Choice            Discharge Placement                       Discharge Plan and Services Additional resources added to the After Visit Summary for     Discharge Planning Services: CM Consult            DME Arranged: Bedside commode, Walker rolling DME Agency: [facility] Date DME Agency Contacted: 06/25/23 Time DME Agency Contacted: 1100 Representative spoke with at DME Agency: Gladys Lamp HH Arranged: PT HH Agency: [facility] ([facility] Date HH Agency Contacted: 06/25/23 Time HH Agency Contacted: 1059 Representative spoke with at [facility] Agency: Renetta Carter  Social Drivers of Health (SDOH) Interventions SDOH Screenings   Food Insecurity: No Food Insecurity (06/24/2023)  Housing: Unknown (06/27/2023)  Transportation Needs: No Transportation Needs (06/27/2023)  Utilities: Not At Risk (06/25/2023)  Alcohol Screen: Low Risk  (06/27/2023)  Financial Resource Strain: Low Risk  (06/27/2023)  Tobacco Use: Medium Risk (06/20/2023)     Readmission Risk Interventions     No data to display

## 2023-06-29 NOTE — Discharge Summary (Signed)
 Physician Discharge Summary  Roberta Soto ZOX:[id number] DOB: 11/05/1980 DOA: 06/19/2023  PCP: Patient, No Pcp Per  Admit date: 06/19/2023 Discharge date: 06/29/2023  Admitted From: Home Disposition: Home  Recommendations for Outpatient Follow-up:  Follow up with PCP in 1-2 weeks Please obtain BMP/CBC in one week Please follow up with orthopedics Follow-up with cardiology Follow-up with PCP  Home Health: Yes Equipment/Devices Walker Discharge Condition: Stable CODE STATUS: Full code Diet recommendation: Cardiac Brief/Interim Summary: 43 y.o. F with PMH significant for asthma and HTN who presented to the ED with confusion and R knee pain. Patient was noted to be quite agitated and tachycardic. She was initially admitted to the ICU. There was concern for right knee infection. Orthopedics was consulted.  Patient has been seen in consultation by PCCM Ortho infectious disease and cardiology.   Discharge Diagnoses:  Principal Problem:   Abscess of bursa of right knee Active Problems:   Sepsis  AMS  Hemodynamic instability   Chronic health problem   AMS (altered mental status)   Asymptomatic LV dysfunction   Benign hypertension   Class 3 severe obesity due to excess calories with serious comorbidity and body mass index (BMI) of 40.0 to 44.9 in adult   Sepsis due to Streptococcus pyogenes (HCC)   Cardiomyopathy (HCC)  Strep pyogenes bacteremia/right leg cellulitis/right knee bursitis/sepsis present on admission status post irrigation and debridement of the right knee Patient had sepsis physiology at admission.   Blood culture from 5/8 grew strep viridans.  Strep pyogenes also noted in right knee aspirate as well as aspirate from the prepatellar bursa.  Repeat blood culture remained negative.  Patient was followed by infectious disease and Ortho.  Patient had irrigation and debridement of the right knee done by Ortho.  Since patient continued to have pain CT of the right lower extremity and  right ankle was done see below for details.  ID recommended to continue Zyvox  for an additional 3 weeks of Zyvox  600 mg p.o. twice daily. CT Right ankle- Generalized nonspecific circumferential subcutaneous edema of the visualized right lower extremity extending through the ankle and into the dorsal foot, concerning for cellulitis in the appropriate setting. No loculated fluid collection. No soft tissue gas.No acute osseous abnormality. Mild degenerative changes at the talonavicular joint.   CT of the right lower extremity - Status post incision and drainage of the right knee prepatellar bursa with ill-defined region of hypodense fluid of indeterminate sterility in the prepatellar soft tissues measuring approximately 3.3 x 1.7 x 4.1 cm with foci of soft tissue air along the superficial margin of the collection, just below the level of the skin surface. Organizing phlegmon/early abscess formation can not be excluded. Surrounding subcutaneous edema and cutaneous thickening is concerning for cellulitis. Small right knee joint effusion Venous Doppler showed no evidence of DVT   Microcytic anemia/B12 deficiency- Anemia panel shows ferritin of 24, iron  10, TIBC 398, percent saturation 3.  Folic acid 9.9.  Vitamin B12 269.  She received 1 unit of packed RBC and Venofer  and discharged on iron  and B12.   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  Seen by cardiology who think this is likely due to severe infection and sepsis.   Patient was started on losartan ,lasix  aldactone  and metoprolol  by cardiology.   Acute kidney injury/hypokalemia Stable   History of anxiety Continue klonopin    Obesity Estimated body mass index is 45.82 kg/m as calculated from the following:   Height as of this encounter: 5\' 7"  (1.702  m).   Weight as of this encounter: 132.7 kg.    Estimated body mass index is 46.75 kg/m as calculated from the following:   Height as of this encounter: 5\' 7"  (1.702 m).   Weight as of  this encounter: 135.4 kg.  Discharge Instructions  Discharge Instructions     Diet - low sodium heart healthy   Complete by: As directed    Diet - low sodium heart healthy   Complete by: As directed    Face-to-face encounter (required for Medicare/Medicaid patients)   Complete by: As directed    I Barbee Lew certify that this patient is under my care and that I, or a nurse practitioner or physician's assistant working with me, had a face-to-face encounter that meets the physician face-to-face encounter requirements with this patient on 06/29/2023. The encounter with the patient was in whole, or in part for the following medical condition(s) which is the primary reason for home health care sepsis   The encounter with the patient was in whole, or in part, for the following medical condition, which is the primary reason for home health care: sepsis   I certify that, based on my findings, the following services are medically necessary home health services: Physical therapy   Reason for Medically Necessary Home Health Services:  Skilled Nursing- Complex Wound Care Therapy- Gait Training, Transfer Training and Stair Training     My clinical findings support the need for the above services: Pain interferes with ambulation/mobility   Further, I certify that my clinical findings support that this patient is homebound due to: Unable to leave home safely without assistance   Home Health   Complete by: As directed    To provide the following care/treatments: PT   Increase activity slowly   Complete by: As directed    Increase activity slowly   Complete by: As directed       Allergies as of 06/29/2023       Reactions   Fruit & Vegetable Daily [nutritional Supplements] Other (See Comments)   Unknown reaction   Latex Rash        Medication List     STOP taking these medications    doxycycline 100 MG tablet Commonly known as: VIBRA-TABS   hydrochlorothiazide 25 MG  tablet Commonly known as: HYDRODIURIL   ondansetron  4 MG tablet Commonly known as: ZOFRAN    predniSONE  10 MG tablet Commonly known as: DELTASONE    predniSONE  20 MG tablet Commonly known as: DELTASONE        TAKE these medications    albuterol  (2.5 MG/3ML) 0.083% nebulizer solution Commonly known as: PROVENTIL  Take 3 mLs (2.5 mg total) by nebulization every 6 (six) hours as needed for wheezing or shortness of breath.   albuterol  108 (90 Base) MCG/ACT inhaler Commonly known as: VENTOLIN  HFA Inhale 2 puffs into the lungs every 4 (four) hours as needed for wheezing or shortness of breath.   budesonide -formoterol  160-4.5 MCG/ACT inhaler Commonly known as: Symbicort  Inhale 2 puffs into the lungs 2 (two) times daily.   clonazePAM  1 MG tablet Commonly known as: KLONOPIN  Take 1 mg by mouth 3 (three) times daily.   [employer] Magnesium  Oxide 250 MG Tabs Generic drug: Magnesium  Oxide -Mg Supplement Take 1 tablet by mouth daily.   cyanocobalamin  1000 MCG tablet Take 1 tablet (1,000 mcg total) by mouth daily.   cyclobenzaprine  10 MG tablet Commonly known as: FLEXERIL  Take 1 tablet (10 mg total) by mouth 3 (three) times daily as needed for  muscle spasms. What changed:  when to take this reasons to take this   escitalopram  10 MG tablet Commonly known as: LEXAPRO  Take 10 mg by mouth daily.   ferrous sulfate  325 (65 FE) MG EC tablet Take 1 tablet (325 mg total) by mouth daily.   furosemide  20 MG tablet Commonly known as: Lasix  Take 1 tablet (20 mg total) by mouth daily.   gabapentin  400 MG capsule Commonly known as: NEURONTIN  Take 400 mg by mouth 3 (three) times daily.   hydrOXYzine 50 MG capsule Commonly known as: VISTARIL Take 50 mg by mouth 3 (three) times daily.   ipratropium-albuterol  0.5-2.5 (3) MG/3ML Soln Commonly known as: DUONEB Take 3 mLs by nebulization every 4 (four) hours as needed.   levocetirizine 5 MG tablet Commonly known as: Xyzal  Take 1 tablet (5 mg  total) by mouth every evening.   linezolid  600 MG tablet Commonly known as: ZYVOX  Take 1 tablet (600 mg total) by mouth every 12 (twelve) hours for 35 doses.   losartan  25 MG tablet Commonly known as: Cozaar  Take 1 tablet (25 mg total) by mouth daily.   medroxyPROGESTERone 150 MG/ML injection Commonly known as: DEPO-PROVERA Inject 150 mg into the muscle every 3 (three) months.   metoprolol  succinate 25 MG 24 hr tablet Commonly known as: TOPROL -XL Take 1 tablet (25 mg total) by mouth daily.   oxyCODONE  5 MG immediate release tablet Commonly known as: Roxicodone  Take 1 tablet (5 mg total) by mouth every 6 (six) hours as needed for severe pain (pain score 7-10).   spironolactone  25 MG tablet Commonly known as: ALDACTONE  Take 0.5 tablets (12.5 mg total) by mouth daily.               Durable Medical Equipment  (From admission, onward)           Start     Ordered   06/25/23 1056  For home use only DME Walker rolling  Once       Question Answer Comment  Walker: With 5 Inch Wheels   Patient needs a walker to treat with the following condition Gait instability      06/25/23 1056   06/25/23 1056  For home use only DME Bedside commode  Once       Comments: Confine to one room  Question:  Patient needs a bedside commode to treat with the following condition  Answer:  Gait instability   06/25/23 1056            Follow-up Information     Sandie Cross, PA-C. Schedule an appointment as soon as possible for a visit in 1 week(s).   Specialty: Orthopedic Surgery Contact information: [address] [place] [address] [phone]         [facility] Follow up.   Why: home health PT services will be provided by [facility]        [facility]. Go in 10 day(s).   Specialty: Cardiology Why: Hospital follow up 07/08/2023 @ 9:30 am PLEASE bring a current medication list to appointment FREE  valet parking, Entrance C, off [employer] ( Look for [facility] entrance0 Contact information: [address] [place]  [address] [phone]               Allergies  Allergen Reactions   Fruit & Vegetable Daily [Nutritional Supplements] Other (See Comments)    Unknown reaction   Latex Rash  Consultations: PCCM, infectious disease, Ortho cardiology   Procedures/Studies: CT ANKLE RIGHT W CONTRAST Result Date: 06/25/2023 CLINICAL DATA:  Right ankle swelling and tenderness. History of bacteremia. EXAM: CT OF THE RIGHT ANKLE WITH CONTRAST TECHNIQUE: Multidetector CT imaging of the right ankle was performed following the standard protocol during bolus administration of intravenous contrast. RADIATION DOSE REDUCTION: This exam was performed according to the departmental dose-optimization program which includes automated exposure control, adjustment of the mA and/or kV according to patient size and/or use of iterative reconstruction technique. CONTRAST:  75mL OMNIPAQUE  IOHEXOL  350 MG/ML SOLN COMPARISON:  None Available. FINDINGS: Bones/Joint/Cartilage No fracture or dislocation. Normal alignment. Mild joint space narrowing with dorsal spurring at the talonavicular articulation. Ankle mortise is congruent. Calcaneal enthesopathy at the insertion of the Achilles tendon. Tiny plantar calcaneal spur. Ligaments Ligaments are suboptimally evaluated by CT. Muscles and Tendons Muscles are normal. No muscle atrophy. No intramuscular fluid collection or hematoma. Soft tissue Generalized nonspecific circumferential subcutaneous edema of the visualized right lower extremity extending through the ankle and into the dorsal foot. No loculated fluid collection. No soft tissue gas. IMPRESSION: 1. Generalized nonspecific circumferential subcutaneous edema of the visualized right lower extremity extending through the ankle and into the dorsal foot, concerning for  cellulitis in the appropriate setting. No loculated fluid collection. No soft tissue gas. 2. No acute osseous abnormality. 3. Mild degenerative changes at the talonavicular joint. Electronically Signed   By: Mannie Seek M.D.   On: 06/25/2023 13:27   VAS US  LOWER EXTREMITY VENOUS (DVT) Result Date: 06/24/2023  Lower Venous DVT Study Patient Name:  TRACY GERKEN  Date of Exam:   06/24/2023 Medical Rec #: [id number]      Accession #:    [id number] Date of Birth: 1980-09-04      Patient Gender: F Patient Age:   60 years Exam Location:  [facility] Procedure:      VAS US  LOWER EXTREMITY VENOUS (DVT) Referring Phys: Dorthy Gavia --------------------------------------------------------------------------------  Indications: Pain.  Limitations: Body habitus and poor ultrasound/tissue interface. Performing Technologist: Aloma Arrant RVS  Examination Guidelines: A complete evaluation includes B-mode imaging, spectral Doppler, color Doppler, and power Doppler as needed of all accessible portions of each vessel. Bilateral testing is considered an integral part of a complete examination. Limited examinations for reoccurring indications may be performed as noted. The reflux portion of the exam is performed with the patient in reverse Trendelenburg.  +--------+---------------+---------+-----------+----------+--------------------+ RIGHT   CompressibilityPhasicitySpontaneityPropertiesThrombus Aging       +--------+---------------+---------+-----------+----------+--------------------+ CFV     Full           Yes      Yes                                       +--------+---------------+---------+-----------+----------+--------------------+ SFJ     Full                                                              +--------+---------------+---------+-----------+----------+--------------------+ FV Prox Full                                                               +--------+---------------+---------+-----------+----------+--------------------+  FV Mid  Full                                                              +--------+---------------+---------+-----------+----------+--------------------+ FV                     Yes      Yes                  patent by color      Distal                                               doppler              +--------+---------------+---------+-----------+----------+--------------------+ PFV     Full                                                              +--------+---------------+---------+-----------+----------+--------------------+ POP     Full           Yes      Yes                                       +--------+---------------+---------+-----------+----------+--------------------+ PTV     Full                                                              +--------+---------------+---------+-----------+----------+--------------------+ PERO    Full                                                              +--------+---------------+---------+-----------+----------+--------------------+   +----+---------------+---------+-----------+----------+--------------+ LEFTCompressibilityPhasicitySpontaneityPropertiesThrombus Aging +----+---------------+---------+-----------+----------+--------------+ CFV Full           Yes      Yes                                 +----+---------------+---------+-----------+----------+--------------+     Summary: RIGHT: - There is no evidence of deep vein thrombosis in the lower extremity.  - No cystic structure found in the popliteal fossa.  LEFT: - No evidence of common femoral vein obstruction.   *See table(s) above for measurements and observations. Electronically signed by Delaney Fearing on 06/24/2023 at 4:07:21 PM.    Final    CT FEMUR RIGHT W CONTRAST Result Date: 06/24/2023 CLINICAL DATA:  Status post incision and drainage of the right knee  prepatellar bursa 06/20/2023. Presenting with pain bacteremia. EXAM: CT OF  THE LOWER RIGHT EXTREMITY WITH CONTRAST TECHNIQUE: Multidetector CT imaging of the lower right extremity was performed according to the standard protocol following intravenous contrast administration. RADIATION DOSE REDUCTION: This exam was performed according to the departmental dose-optimization program which includes automated exposure control, adjustment of the mA and/or kV according to patient size and/or use of iterative reconstruction technique. CONTRAST:  OMNIPAQUE  IOHEXOL  350 MG/ML SOLN COMPARISON:  06/19/2023. FINDINGS: Bones/Joint/Cartilage No acute fracture or dislocation. Small knee joint effusion. Mild medial femorotibial compartment joint space narrowing. Mild degenerative changes of the right hip. Ligaments Ligaments are suboptimally evaluated by CT. Muscles and Tendons Muscles are normal. No muscle atrophy. No intramuscular fluid collection. Soft tissue Status post incision and drainage of the right knee prepatellar bursa with ill-defined region of hypodense fluid in the prepatellar soft tissues measuring approximately 3.3 x 1.7 cm in axial dimension and 4.1 cm in craniocaudal dimension (series 3, image 255 and series 8, image 66). Foci of soft tissue air along the superficial margin of the collection, just below the level of the skin surface. There is surrounding stranding and subcutaneous edema with overlying cutaneous thickening. Nonspecific generalized subcutaneous edema and cutaneous thickening of the visualized right lower extremity. IMPRESSION: 1. Status post incision and drainage of the right knee prepatellar bursa with ill-defined region of hypodense fluid of indeterminate sterility in the prepatellar soft tissues measuring approximately 3.3 x 1.7 x 4.1 cm with foci of soft tissue air along the superficial margin of the collection, just below the level of the skin surface. Organizing phlegmon/early abscess  formation can not be excluded. Surrounding subcutaneous edema and cutaneous thickening is concerning for cellulitis. 2. Small right knee joint effusion. Electronically Signed   By: Mannie Seek M.D.   On: 06/24/2023 12:17   ECHOCARDIOGRAM COMPLETE Result Date: 06/20/2023    ECHOCARDIOGRAM REPORT   Patient Name:   MITSY OWEN Date of Exam: 06/20/2023 Medical Rec #:  [id number]     Height:       67.0 in Accession #:    [id number]    Weight:       297.2 lb Date of Birth:  05-Jun-1980     BSA:          2.393 m Patient Age:    42 years      BP:           107/85 mmHg Patient Gender: F             HR:           98 bpm. Exam Location:  Inpatient Procedure: 2D Echo (Both Spectral and Color Flow Doppler were utilized during            procedure). Indications:    sepsis  History:        Patient has no prior history of Echocardiogram examinations.                 Signs/Symptoms:Altered Mental Status.  Sonographer:    Dione Franks RDCS Referring Phys: [id number] LAURA R GLEASON IMPRESSIONS  1. Left ventricular ejection fraction, by estimation, is 30 to 35%. The left ventricle has moderately decreased function. The left ventricle demonstrates global hypokinesis. The left ventricular internal cavity size was moderately dilated. Left ventricular diastolic parameters were normal.  2. Right ventricular systolic function is normal. The right ventricular size is normal. There is normal pulmonary artery systolic pressure.  3. Left atrial size was moderately dilated.  4. The mitral valve is abnormal. Mild mitral valve  regurgitation. No evidence of mitral stenosis.  5. The aortic valve is tricuspid. Aortic valve regurgitation is not visualized. No aortic stenosis is present.  6. The inferior vena cava is normal in size with greater than 50% respiratory variability, suggesting right atrial pressure of 3 mmHg. FINDINGS  Left Ventricle: Left ventricular ejection fraction, by estimation, is 30 to 35%. The left ventricle has moderately  decreased function. The left ventricle demonstrates global hypokinesis. Strain was performed and the global longitudinal strain is indeterminate. The left ventricular internal cavity size was moderately dilated. There is no left ventricular hypertrophy. Left ventricular diastolic parameters were normal. Right Ventricle: The right ventricular size is normal. No increase in right ventricular wall thickness. Right ventricular systolic function is normal. There is normal pulmonary artery systolic pressure. The tricuspid regurgitant velocity is 2.65 m/s, and  with an assumed right atrial pressure of 3 mmHg, the estimated right ventricular systolic pressure is 31.1 mmHg. Left Atrium: Left atrial size was moderately dilated. Right Atrium: Right atrial size was normal in size. Pericardium: There is no evidence of pericardial effusion. Mitral Valve: The mitral valve is abnormal. There is mild thickening of the mitral valve leaflet(s). Mild mitral valve regurgitation. No evidence of mitral valve stenosis. Tricuspid Valve: The tricuspid valve is normal in structure. Tricuspid valve regurgitation is mild . No evidence of tricuspid stenosis. Aortic Valve: The aortic valve is tricuspid. Aortic valve regurgitation is not visualized. No aortic stenosis is present. Pulmonic Valve: The pulmonic valve was normal in structure. Pulmonic valve regurgitation is not visualized. No evidence of pulmonic stenosis. Aorta: The aortic root is normal in size and structure. Venous: The inferior vena cava is normal in size with greater than 50% respiratory variability, suggesting right atrial pressure of 3 mmHg. IAS/Shunts: No atrial level shunt detected by color flow Doppler. Additional Comments: 3D was performed not requiring image post processing on an independent workstation and was indeterminate.  LEFT VENTRICLE PLAX 2D LVIDd:         6.50 cm      Diastology LVIDs:         5.50 cm      LV e' medial:  6.74 cm/s LV PW:         1.00 cm      LV e'  lateral: 10.40 cm/s LV IVS:        1.10 cm LVOT diam:     2.20 cm LV SV:         76 LV SV Index:   32 LVOT Area:     3.80 cm  LV Volumes (MOD) LV vol d, MOD A2C: 150.0 ml LV vol d, MOD A4C: 134.0 ml LV vol s, MOD A2C: 99.0 ml LV vol s, MOD A4C: 103.0 ml LV SV MOD A2C:     51.0 ml LV SV MOD A4C:     134.0 ml LV SV MOD BP:      43.9 ml RIGHT VENTRICLE             IVC RV Basal diam:  3.30 cm     IVC diam: 1.90 cm RV S prime:     15.80 cm/s TAPSE (M-mode): 2.7 cm LEFT ATRIUM              Index        RIGHT ATRIUM           Index LA diam:        4.40 cm  1.84 cm/m   RA Area:  15.10 cm LA Vol (A2C):   102.0 ml 42.63 ml/m  RA Volume:   35.00 ml  14.63 ml/m LA Vol (A4C):   60.2 ml  25.16 ml/m LA Biplane Vol: 81.5 ml  34.06 ml/m  AORTIC VALVE LVOT Vmax:   118.00 cm/s LVOT Vmean:  80.500 cm/s LVOT VTI:    0.200 m  AORTA Ao Root diam: 3.20 cm Ao Asc diam:  2.90 cm MR Peak grad:    106.1 mmHg   TRICUSPID VALVE MR Mean grad:    73.0 mmHg    TR Peak grad:   28.1 mmHg MR Vmax:         515.00 cm/s  TR Vmax:        265.00 cm/s MR Vmean:        408.0 cm/s MR PISA:         0.57 cm     SHUNTS MR PISA Eff ROA: 4 mm        Systemic VTI:  0.20 m MR PISA Radius:  0.30 cm      Systemic Diam: 2.20 cm Janelle Mediate MD Electronically signed by Janelle Mediate MD Signature Date/Time: 06/20/2023/5:51:58 PM    Final    DG Chest Port 1 View Result Date: 06/19/2023 CLINICAL DATA:  Questionable sepsis. Evaluate for abnormality. Altered mental status. EXAM: PORTABLE CHEST 1 VIEW COMPARISON:  AP chest 10/21/2022, chest two views 03/09/2020 FINDINGS: There appears to be interval increase in the size the cardiac silhouette, now mildly enlarged. Mediastinal contours are within limits. New mild interstitial thickening. No focal airspace opacity. No pleural effusion pneumothorax. No acute skeletal abnormality. IMPRESSION: Compared to 10/21/2022: 1. Interval increase in the size of the cardiac silhouette, now mildly enlarged. This may be secondary  to cardiomegaly and/or a pericardial effusion. 2. New mild interstitial thickening, which may represent mild interstitial pulmonary edema. Electronically Signed   By: Bertina Broccoli M.D.   On: 06/19/2023 13:34   DG Knee Right Port Result Date: 06/19/2023 CLINICAL DATA:  Altered mental status. Shortness of breath. Redness and swelling. Sore and anterior right knee. Question of sepsis. EXAM: PORTABLE RIGHT KNEE - 1-2 VIEW COMPARISON:  None Available. Moderate quadriceps and patellar tendon FINDINGS: Chronic please like changes at the insertions on the patella. No joint effusion. Minimal medial compartment joint space narrowing. Mild chronic enthesopathic change at the patellar tendon insertion on the tibial tubercle. No acute fracture or dislocation. Diffuse subcutaneous fat edema and swelling, possibly systemic. IMPRESSION: 1. Diffuse subcutaneous fat edema and swelling, possibly systemic. 2. No acute fracture or dislocation. Electronically Signed   By: Bertina Broccoli M.D.   On: 06/19/2023 12:58   (Echo, Carotid, EGD, Colonoscopy, ERCP)    Subjective: Patient resting in bed denies any new complaints anxious to go home  Discharge Exam: Vitals:   06/29/23 0810 06/29/23 0848  BP: (!) 132/101   Pulse: 98   Resp: 18   Temp: 98.2 F (36.8 C)   SpO2: 96% 96%   Vitals:   06/29/23 0446 06/29/23 0500 06/29/23 0810 06/29/23 0848  BP: (!) 131/95  (!) 132/101   Pulse: 94  98   Resp: 17  18   Temp: 98 F (36.7 C)  98.2 F (36.8 C)   TempSrc:   Oral   SpO2: 98%  96% 96%  Weight:  135.4 kg    Height:        General: Pt is alert, awake, not in acute distress Cardiovascular: RRR, S1/S2 +, no rubs, no gallops  Respiratory: CTA bilaterally, no wheezing, no rhonchi Abdominal: Soft, NT, ND, bowel sounds + Extremities:right ankle edema r leg edema better   The results of significant diagnostics from this hospitalization (including imaging, microbiology, ancillary and laboratory) are listed below for  reference.     Microbiology: Recent Results (from the past 240 hours)  MRSA Next Gen by PCR, Nasal     Status: None   Collection Time: 06/19/23  5:56 PM   Specimen: Nasal Mucosa; Nasal Swab  Result Value Ref Range Status   MRSA by PCR Next Gen NOT DETECTED NOT DETECTED Final    Comment: (NOTE) The GeneXpert MRSA Assay (FDA approved for NASAL specimens only), is one component of a comprehensive MRSA colonization surveillance program. It is not intended to diagnose MRSA infection nor to guide or monitor treatment for MRSA infections. Test performance is not FDA approved in patients less than 50 years old. Performed at [facility], [address], [place], [place] [address]   Aerobic/Anaerobic Culture w Gram Stain (surgical/deep wound)     Status: None   Collection Time: 06/20/23  1:29 PM   Specimen: Wound  Result Value Ref Range Status   Specimen Description WOUND  Final   Special Requests RIGHT PREPATELLAR BURSA  Final   Gram Stain NO WBC SEEN GRAM POSITIVE COCCI IN PAIRS   Final   Culture   Final    RARE GROUP A STREP (S.PYOGENES) ISOLATED Beta hemolytic streptococci are predictably susceptible to penicillin  and other beta lactams. Susceptibility testing not routinely performed. NO ANAEROBES ISOLATED Performed at [facility], [address], [place], [place] [address]    Report Status 06/25/2023 FINAL  Final  Aerobic/Anaerobic Culture w Gram Stain (surgical/deep wound)     Status: None   Collection Time: 06/20/23  1:34 PM   Specimen: Wound; Body Fluid  Result Value Ref Range Status   Specimen Description WOUND  Final   Special Requests RIGHT KNEE ASPIRATION  Final   Gram Stain NO WBC SEEN NO ORGANISMS SEEN   Final   Culture   Final    No growth aerobically or anaerobically. Performed at [facility], [address], [place], [place] [address]    Report Status 06/25/2023 FINAL  Final  Culture, blood (Routine X 2) w Reflex to ID Panel     Status:  None   Collection Time: 06/21/23 10:22 AM   Specimen: BLOOD LEFT HAND  Result Value Ref Range Status   Specimen Description BLOOD LEFT HAND  Final   Special Requests   Final    BOTTLES DRAWN AEROBIC AND ANAEROBIC Blood Culture results may not be optimal due to an inadequate volume of blood received in culture bottles   Culture   Final    NO GROWTH 5 DAYS Performed at [facility], [address] [place], [place] [address]    Report Status 06/26/2023 FINAL  Final  Culture, blood (Routine X 2) w Reflex to ID Panel     Status: None   Collection Time: 06/21/23 10:28 AM   Specimen: BLOOD RIGHT HAND  Result Value Ref Range Status   Specimen Description BLOOD RIGHT HAND  Final   Special Requests   Final    BOTTLES DRAWN AEROBIC AND ANAEROBIC Blood Culture results may not be optimal due to an inadequate volume of blood received in culture bottles   Culture   Final    NO GROWTH 5 DAYS Performed at [facility],  [address] [place], [place] [address]    Report Status 06/26/2023 FINAL  Final     Labs: BNP (last 3 results) No results for input(s): "BNP" in the last 8760 hours. Basic Metabolic Panel: Recent Labs  Lab 06/23/23 0508 06/24/23 0658 06/25/23 0312 06/26/23 0500 06/27/23 0030 06/28/23 2103  NA 139 137 138 139 139 139  K 3.5 3.7 3.4* 3.6 3.7 3.7  CL 106 106 106 106 106 107  CO2 23 23 24 23 26 23   GLUCOSE 94 89 104* 112* 100* 93  BUN 9 8 9 6 8 7   CREATININE 0.96 0.94 0.98 0.93 1.22* 1.18*  CALCIUM  8.2* 8.3* 8.2* 8.2* 8.2* 8.5*  MG 1.9  --   --   --   --   --    Liver Function Tests: No results for input(s): "AST", "ALT", "ALKPHOS", "BILITOT", "PROT", "ALBUMIN" in the last 168 hours. No results for input(s): "LIPASE", "AMYLASE" in the last 168 hours. No results for input(s): "AMMONIA" in the last 168 hours. CBC: Recent Labs  Lab 06/24/23 0658 06/25/23 0312 06/26/23 0500 06/27/23 0030 06/28/23 2103  WBC 18.2* 12.6* 9.1 8.2 7.1  HGB 8.0* 7.6* 7.9*  7.4* 8.5*  HCT 28.8* 27.7* 28.4* 27.4* 29.6*  MCV 68.4* 70.3* 70.0* 70.4* 69.5*  PLT 458* 472* 523* 539* 569*   Cardiac Enzymes: No results for input(s): "CKTOTAL", "CKMB", "CKMBINDEX", "TROPONINI" in the last 168 hours. BNP: Invalid input(s): "POCBNP" CBG: Recent Labs  Lab 06/28/23 0620 06/28/23 1159 06/28/23 1626 06/28/23 2122 06/29/23 0611  GLUCAP 88 120* 86 87 88   D-Dimer No results for input(s): "DDIMER" in the last 72 hours. Hgb A1c No results for input(s): "HGBA1C" in the last 72 hours. Lipid Profile No results for input(s): "CHOL", "HDL", "LDLCALC", "TRIG", "CHOLHDL", "LDLDIRECT" in the last 72 hours. Thyroid function studies No results for input(s): "TSH", "T4TOTAL", "T3FREE", "THYROIDAB" in the last 72 hours.  Invalid input(s): "FREET3" Anemia work up No results for input(s): "VITAMINB12", "FOLATE", "FERRITIN", "TIBC", "IRON ", "RETICCTPCT" in the last 72 hours. Urinalysis    Component Value Date/Time   COLORURINE AMBER (A) 09/20/2018 1116   APPEARANCEUR CLOUDY (A) 09/20/2018 1116   LABSPEC >1.030 (H) 09/20/2018 1116   PHURINE 5.5 09/20/2018 1116   GLUCOSEU NEGATIVE 09/20/2018 1116   HGBUR MODERATE (A) 09/20/2018 1116   BILIRUBINUR MODERATE (A) 09/20/2018 1116   KETONESUR 15 (A) 09/20/2018 1116   PROTEINUR 30 (A) 09/20/2018 1116   NITRITE NEGATIVE 09/20/2018 1116   LEUKOCYTESUR NEGATIVE 09/20/2018 1116   Sepsis Labs Recent Labs  Lab 06/25/23 0312 06/26/23 0500 06/27/23 0030 06/28/23 2103  WBC 12.6* 9.1 8.2 7.1   Microbiology Recent Results (from the past 240 hours)  MRSA Next Gen by PCR, Nasal     Status: None   Collection Time: 06/19/23  5:56 PM   Specimen: Nasal Mucosa; Nasal Swab  Result Value Ref Range Status   MRSA by PCR Next Gen NOT DETECTED NOT DETECTED Final    Comment: (NOTE) The GeneXpert MRSA Assay (FDA approved for NASAL specimens only), is one component of a comprehensive MRSA colonization surveillance program. It is not  intended to diagnose MRSA infection nor to guide or monitor treatment for MRSA infections. Test performance is not FDA approved in patients less than 30 years old. Performed at [facility], [address], [place], [place] [address]   Aerobic/Anaerobic Culture w Gram Stain (surgical/deep wound)     Status: None   Collection Time: 06/20/23  1:29  PM   Specimen: Wound  Result Value Ref Range Status   Specimen Description WOUND  Final   Special Requests RIGHT PREPATELLAR BURSA  Final   Gram Stain NO WBC SEEN GRAM POSITIVE COCCI IN PAIRS   Final   Culture   Final    RARE GROUP A STREP (S.PYOGENES) ISOLATED Beta hemolytic streptococci are predictably susceptible to penicillin  and other beta lactams. Susceptibility testing not routinely performed. NO ANAEROBES ISOLATED Performed at [facility], [address], [place], [place] [address]    Report Status 06/25/2023 FINAL  Final  Aerobic/Anaerobic Culture w Gram Stain (surgical/deep wound)     Status: None   Collection Time: 06/20/23  1:34 PM   Specimen: Wound; Body Fluid  Result Value Ref Range Status   Specimen Description WOUND  Final   Special Requests RIGHT KNEE ASPIRATION  Final   Gram Stain NO WBC SEEN NO ORGANISMS SEEN   Final   Culture   Final    No growth aerobically or anaerobically. Performed at [facility], [address] [place], [place] [address]    Report Status 06/25/2023 FINAL  Final  Culture, blood (Routine X 2) w Reflex to ID Panel     Status: None   Collection Time: 06/21/23 10:22 AM   Specimen: BLOOD LEFT HAND  Result Value Ref Range Status   Specimen Description BLOOD LEFT HAND  Final   Special Requests   Final    BOTTLES DRAWN AEROBIC AND ANAEROBIC Blood Culture results may not be optimal due to an inadequate volume of blood received in culture bottles   Culture   Final    NO GROWTH 5 DAYS Performed at [facility], [address], [place], [place] [address]    Report Status  06/26/2023 FINAL  Final  Culture, blood (Routine X 2) w Reflex to ID Panel     Status: None   Collection Time: 06/21/23 10:28 AM   Specimen: BLOOD RIGHT HAND  Result Value Ref Range Status   Specimen Description BLOOD RIGHT HAND  Final   Special Requests   Final    BOTTLES DRAWN AEROBIC AND ANAEROBIC Blood Culture results may not be optimal due to an inadequate volume of blood received in culture bottles   Culture   Final    NO GROWTH 5 DAYS Performed at [facility], [address], [place], [place] [address]    Report Status 06/26/2023 FINAL  Final     Time coordinating discharge: 39 min  SIGNED:   Barbee Lew, MD  [facility] 06/29/2023, 3:25 PM

## 2023-06-30 ENCOUNTER — Other Ambulatory Visit (HOSPITAL_COMMUNITY): Payer: Self-pay

## 2023-07-01 ENCOUNTER — Telehealth: Payer: Self-pay | Admitting: Orthopaedic Surgery

## 2023-07-01 NOTE — Telephone Encounter (Signed)
 Called and gave verbal

## 2023-07-01 NOTE — Telephone Encounter (Signed)
 Roberta Soto with [facility] called and needs verbal orders. Home health PT 2x a wk 1, 1wk 7. CB#[phone]

## 2023-07-08 ENCOUNTER — Other Ambulatory Visit (HOSPITAL_COMMUNITY): Payer: Self-pay

## 2023-07-08 ENCOUNTER — Ambulatory Visit (HOSPITAL_COMMUNITY)
Admit: 2023-07-08 | Discharge: 2023-07-08 | Disposition: A | Discharge status: Home / Self Care | Attending: Family Medicine | Admitting: Family Medicine

## 2023-07-08 ENCOUNTER — Encounter (HOSPITAL_COMMUNITY): Payer: Self-pay

## 2023-07-08 ENCOUNTER — Telehealth (HOSPITAL_COMMUNITY): Payer: Self-pay

## 2023-07-08 ENCOUNTER — Ambulatory Visit (HOSPITAL_COMMUNITY): Payer: Self-pay | Admitting: Family Medicine

## 2023-07-08 VITALS — BP 110/70 | HR 104 | Wt 252.8 lb

## 2023-07-08 DIAGNOSIS — Z720 Tobacco use: Secondary | ICD-10-CM | POA: Diagnosis not present

## 2023-07-08 DIAGNOSIS — I5022 Chronic systolic (congestive) heart failure: Secondary | ICD-10-CM | POA: Diagnosis present

## 2023-07-08 DIAGNOSIS — I10 Essential (primary) hypertension: Secondary | ICD-10-CM

## 2023-07-08 LAB — BASIC METABOLIC PANEL WITH GFR
Anion gap: 7 (ref 5–15)
BUN: 5 mg/dL — ABNORMAL LOW (ref 6–20)
CO2: 25 mmol/L (ref 22–32)
Calcium: 8.9 mg/dL (ref 8.9–10.3)
Chloride: 109 mmol/L (ref 98–111)
Creatinine, Ser: 1.19 mg/dL — ABNORMAL HIGH (ref 0.44–1.00)
GFR, Estimated: 59 mL/min — ABNORMAL LOW (ref >60)
Glucose, Bld: 94 mg/dL (ref 70–99)
Potassium: 3.7 mmol/L (ref 3.5–5.1)
Sodium: 141 mmol/L (ref 135–145)

## 2023-07-08 LAB — CBC
HCT: 39.7 % (ref 36.0–46.0)
Hemoglobin: 11.2 g/dL — ABNORMAL LOW (ref 12.0–15.0)
MCH: 20.4 pg — ABNORMAL LOW (ref 26.0–34.0)
MCHC: 28.2 g/dL — ABNORMAL LOW (ref 30.0–36.0)
MCV: 72.3 fL — ABNORMAL LOW (ref 80.0–100.0)
Platelets: 427 10*3/uL — ABNORMAL HIGH (ref 150–400)
RBC: 5.49 MIL/uL — ABNORMAL HIGH (ref 3.87–5.11)
RDW: 22.9 % — ABNORMAL HIGH (ref 11.5–15.5)
WBC: 3.9 10*3/uL — ABNORMAL LOW (ref 4.0–10.5)
nRBC: 0 % (ref 0.0–0.2)

## 2023-07-08 LAB — BRAIN NATRIURETIC PEPTIDE: B Natriuretic Peptide: 759.5 pg/mL — ABNORMAL HIGH (ref 0.0–100.0)

## 2023-07-08 MED ORDER — ENTRESTO 24-26 MG PO TABS: 1.0000 | ORAL_TABLET | Freq: Two times a day (BID) | ORAL | 11 refills | Status: DC

## 2023-07-08 MED ORDER — FUROSEMIDE 20 MG PO TABS: 20.0000 mg | ORAL_TABLET | ORAL | Status: AC | PRN

## 2023-07-08 NOTE — Telephone Encounter (Signed)
 Advanced Heart Failure Patient Advocate Encounter  Test billing for this patients current coverage returns copay of $4 for 30 or 90 day supply of Entresto . Assistance not needed for this medication at this time.  Kennis Peacock, CPhT Rx Patient Advocate Phone: [phone]

## 2023-07-08 NOTE — Patient Instructions (Addendum)
 STOP Losartan .  START Entresto 24/26 mg Twice daily  Labs done today, your results will be available in MyChart, we will contact you for abnormal readings.  Change Lasix  to 20 mg PRN  Your physician has requested that you have cardiac CT. Cardiac computed tomography (CT) is a painless test that uses an x-ray machine to take clear, detailed pictures of your heart. For further information please visit [email or web address] Please follow instruction sheet as given.   Your physician recommends that you schedule a follow-up appointment in: as scheduled.  If you have any questions or concerns before your next appointment please send us  a message through [place] or call our office at [phone].    TO LEAVE A MESSAGE FOR THE NURSE SELECT OPTION 2, PLEASE LEAVE A MESSAGE INCLUDING: YOUR NAME DATE OF BIRTH CALL BACK NUMBER REASON FOR CALL**this is important as we prioritize the call backs  YOU WILL RECEIVE A CALL BACK THE SAME DAY AS LONG AS YOU CALL BEFORE 4:00 PM  At the [facility], you and your health needs are our priority. As part of our continuing mission to provide you with exceptional heart care, we have created designated Provider Care Teams. These Care Teams include your primary Cardiologist (physician) and Advanced Practice Providers (APPs- Physician Assistants and Nurse Practitioners) who all work together to provide you with the care you need, when you need it.   You may see any of the following providers on your designated Care Team at your next follow up: Dr Jules Oar Dr Peder Bourdon Dr. Alwin Baars Dr. Arta Lark Amy Marijane Shoulders, NP Ruddy Corral, [place] [facility] [place], [place] Dennise Fitz, NP [place] Lee, NP Shawnee Dellen, NP Luster Salters, PharmD Bevely Brush, PharmD   Please be sure to bring in all your medications bottles to every appointment.    Thank you for choosing [facility]

## 2023-07-10 NOTE — Progress Notes (Unsigned)
 Cardiology Office Note    Patient Name: Roberta Soto Date of Encounter: 07/11/2023  Primary Care Provider:  Patient, No Pcp Per Primary Cardiologist:  Olinda Bertrand, DO Primary Electrophysiologist: None   Past Medical History    Past Medical History:  Diagnosis Date   Asthma    Hypertension     History of Present Illness  Roberta Soto is a 43 y.o. female with a PMH of HFrEF, HTN, septic bursitis, asthma, tobacco abuse, family Hx of CAD, NICM, obesity who presents today for posthospital follow-up.  Roberta Soto presented to the ED on  06/19/2023 via EMS with altered mental status and complaint of knee pain from a fall suffered 1 month prior.  She was consulted by orthopedics and underwent exploration with blood cultures resulting positive for strep pyogenes.  She completed a 2D echo that showed reduced EF of 30-35% with global hypokinesis and no vegetations.  She was consulted by cardiology with decreased LV dysfunction be related to sepsis.  She was started on IV Lasix  for volume overload and addition of losartan  and metoprolol  for GDMT.  She was discharged and seen in follow-up and advanced heart failure clinic on 07/08/2023.  During visit she reported fair with shortness of breath walking with her walker on flat ground.  He had a bedside echo performed by Dr. 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  Patient denies chest pain, palpitations, dyspnea, PND, orthopnea, nausea, vomiting, dizziness, syncope, edema, weight gain, or early satiety.  Roberta Soto presents today for general cardiology follow-up.She has a history of heart failure and was recently seen at the heart failure clinic. She is currently on losartan  but plans to switch to Entresto once it is available through insurance assistance. She also takes spironolactone  at a dose of 25 mg and Toprol . She does not have a blood pressure cuff at home but intends to obtain one to monitor her blood pressure, which has been elevated with a  recent reading of 138/104 mmHg. She is experiencing knee pain following a fall while skating, which resulted in a scrape and subsequent infection. The pain is more tolerable now, and she avoids taking narcotics due to low tolerance and side effects. She uses clozapine and gabapentin  for pain management and occasionally takes ibuprofen 600 mg as needed, but not daily, to avoid affecting her blood pressure. The knee still has stitches, and she anticipates pain when they are removed. She is on a regimen of Zyvox  for three weeks to prevent progression of the infection. She has a family history of cardiac issues, including her mother with an enlarged heart, a sister with arrhythmia, and an aunt with a pacemaker. She has experienced an irregular heartbeat herself and is concerned about her cardiac health. She reports significant weight loss from 297 lbs to 252 lbs, which she attributes to stopping prednisone  and Depo-Provera. She is active and tries to maintain her weight through movement and activity. She experiences itching, which she describes as feeling like 'little bugs eating at me' or 'pin needles', and takes Benadryl  for relief, though it makes her drowsy. She has tried various soaps and lotions to alleviate the symptoms, suspecting eczema. She smokes about four to five cigarettes a day and has tried patches and bupropion to quit smoking without success. She wants to quit smoking due to its impact on her health. She has a history of anxiety and takes clozapine, prescribed by her primary care doctor, to manage it. She experiences vivid dreams and is cautious about making changes  to her medication regimen without consulting her doctor.  Discussed the use of AI scribe software for clinical note transcription with the patient, who gave verbal consent to proceed.  History of Present Illness   Review of Systems  Please see the history of present illness.    All other systems reviewed and are otherwise negative  except as noted above.  Physical Exam     Wt Readings from Last 3 Encounters:  07/11/23 252 lb (114.3 kg)  07/08/23 252 lb 12.8 oz (114.7 kg)  06/29/23 298 lb 8.1 oz (135.4 kg)   VS: Vitals:   07/11/23 1052 07/11/23 1125  BP: (!) 156/110 (!) 134/104  Pulse: 83   SpO2: 95%   ,Body mass index is 39.47 kg/m. GEN: Well nourished, well developed in no acute distress Neck: No JVD; No carotid bruits Pulmonary: Clear to auscultation without rales, wheezing or rhonchi  Cardiovascular: Normal rate. Regular rhythm. Normal S1. Normal S2.   Murmurs: There is no murmur.  ABDOMEN: Soft, non-tender, non-distended EXTREMITIES: Trace bilateral lower extremity edema  EKG/LABS/ Recent Cardiac Studies   ECG personally reviewed by me today -none completed today  Risk Assessment/Calculations:          Lab Results  Component Value Date   WBC 3.9 (L) 07/08/2023   HGB 11.2 (L) 07/08/2023   HCT 39.7 07/08/2023   MCV 72.3 (L) 07/08/2023   PLT 427 (H) 07/08/2023   Lab Results  Component Value Date   CREATININE 1.19 (H) 07/08/2023   BUN 5 (L) 07/08/2023   NA 141 07/08/2023   K 3.7 07/08/2023   CL 109 07/08/2023   CO2 25 07/08/2023   Lab Results  Component Value Date   CHOL 119 06/24/2023   HDL 18 (L) 06/24/2023   LDLCALC 76 06/24/2023   TRIG 125 06/24/2023   CHOLHDL 6.6 06/24/2023    Lab Results  Component Value Date   HGBA1C 5.5 06/19/2023   Assessment & Plan    Assessment & Plan  1.  HFrEF: -Chronic heart failure with recent management adjustments. Transition from losartan  to Entresto planned. Blood pressure management crucial. Recent echocardiogram and upcoming MRI for further evaluation. - Start Entresto once available and discontinue losartan . - Increase spironolactone  to 25 mg daily. - Monitor blood pressure with home cuff. - Log blood pressure and weight. - Continue Toprol  XL 25 mg as prescribed. - Recheck blood pressure and adjust medication as needed. - Provide  prescription for home blood pressure cuff.  2.  Essential hypertension: -HYPERTENSION CONTROL Vitals:   07/11/23 1052 07/11/23 1125  BP: (!) 156/110 (!) 134/104    The patient's blood pressure is elevated above target today.  In order to address the patient's elevated BP: A current anti-hypertensive medication was adjusted today.; Follow up with general cardiology has been recommended.    - Increase spironolactone  to 25 mg daily. - Provide prescription for home blood pressure cuff. - Log blood pressure readings for one week. - Call with blood pressure readings after one week. - Adjust medication based on blood pressure readings.   3.  Family history of CAD: She has a family history of cardiac issues, including her mother with an enlarged heart, a sister with arrhythmia, and an aunt with a pacemaker.  4.  Tobacco abuse -Chronic tobacco use with recent attempts to quit. Previous treatments ineffective. Chantix prescribed to assist with cessation. - Prescribe Chantix, starting with 0.5 mg twice daily for 30 days. - Encourage reduction in cigarette use. - Monitor  for side effects, including vivid dreams. - Follow up for potential refill of Chantix.  5.  Septic patellar bursitis - Pain tolerable with current medication. Antibiotic regimen includes Zyvox  for three weeks. - Continue Zyvox  for the full three-week course. - Order refill for Zyvox  to complete the 30-day regimen. - Monitor for signs of infection or complications.  6.  History of obesity: - Patient's BMI is 39.47 kg/m - Courage to increase activity as tolerated and modify calorie intake  Disposition: Follow-up with Olinda Bertrand, DO as scheduled    Signed, Francene Ing, Retha Cast, NP 07/11/2023, 12:36 PM [facility]

## 2023-07-11 ENCOUNTER — Ambulatory Visit: Attending: Nurse Practitioner | Admitting: Nurse Practitioner

## 2023-07-11 ENCOUNTER — Encounter: Payer: Self-pay | Admitting: Nurse Practitioner

## 2023-07-11 ENCOUNTER — Other Ambulatory Visit (HOSPITAL_COMMUNITY): Payer: Self-pay

## 2023-07-11 VITALS — BP 134/104 | HR 83 | Ht 67.0 in | Wt 252.0 lb

## 2023-07-11 DIAGNOSIS — I5022 Chronic systolic (congestive) heart failure: Secondary | ICD-10-CM | POA: Insufficient documentation

## 2023-07-11 DIAGNOSIS — M71161 Other infective bursitis, right knee: Secondary | ICD-10-CM | POA: Diagnosis present

## 2023-07-11 DIAGNOSIS — I10 Essential (primary) hypertension: Secondary | ICD-10-CM | POA: Insufficient documentation

## 2023-07-11 DIAGNOSIS — Z720 Tobacco use: Secondary | ICD-10-CM | POA: Diagnosis present

## 2023-07-11 DIAGNOSIS — E66812 Obesity, class 2: Secondary | ICD-10-CM | POA: Insufficient documentation

## 2023-07-11 DIAGNOSIS — Z8249 Family history of ischemic heart disease and other diseases of the circulatory system: Secondary | ICD-10-CM | POA: Insufficient documentation

## 2023-07-11 DIAGNOSIS — E6609 Other obesity due to excess calories: Secondary | ICD-10-CM | POA: Diagnosis present

## 2023-07-11 DIAGNOSIS — Z6839 Body mass index (BMI) 39.0-39.9, adult: Secondary | ICD-10-CM | POA: Diagnosis present

## 2023-07-11 MED ORDER — OMRON 3 SERIES BP MONITOR DEVI: 1.0000 | Freq: Every day | 0 refills | Status: DC

## 2023-07-11 MED ORDER — SPIRONOLACTONE 25 MG PO TABS: 25.0000 mg | ORAL_TABLET | Freq: Every day | ORAL | 3 refills | Status: AC

## 2023-07-11 MED ORDER — VARENICLINE TARTRATE 0.5 MG PO TABS
0.5000 mg | ORAL_TABLET | Freq: Two times a day (BID) | ORAL | 1 refills | Status: AC
Start: 2023-07-11 — End: ?

## 2023-07-11 MED ORDER — OMRON 3 SERIES BP MONITOR DEVI
1.0000 | Freq: Every day | 0 refills | Status: DC
Start: 2023-07-11 — End: 2023-09-02
  Filled 2023-07-11: qty 1, 30d supply, fill #0

## 2023-07-11 NOTE — Patient Instructions (Addendum)
 Medication Instructions:  INCREASE Spironolactone  to 25mg  Take 1 tablet once a day  START Chantix take 1 tablet twice a day  Contact the office in a week with blood pressure readings *If you need a refill on your cardiac medications before your next appointment, please call your pharmacy*  Lab Work: None ordered If you have labs (blood work) drawn today and your tests are completely normal, you will receive your results only by: MyChart Message (if you have MyChart) OR A paper copy in the mail If you have any lab test that is abnormal or we need to change your treatment, we will call you to review the results.  Testing/Procedures: None ordered  Follow-Up: At [facility], you and your health needs are our priority.  As part of our continuing mission to provide you with exceptional heart care, our providers are all part of one team.  This team includes your primary Cardiologist (physician) and Advanced Practice Providers or APPs (Physician Assistants and Nurse Practitioners) who all work together to provide you with the care you need, when you need it.  Your next appointment:   FOLLOW UP AS SCHEDULED   Provider:   Olinda Bertrand, DO    We recommend signing up for the patient portal called "MyChart".  Sign up information is provided on this After Visit Summary.  MyChart is used to connect with patients for Virtual Visits (Telemedicine).  Patients are able to view lab/test results, encounter notes, upcoming appointments, etc.  Non-urgent messages can be sent to your provider as well.   To learn more about what you can do with MyChart, go to [email or web address].   Other Instructions: Please check your weight daily. Please contact the office if you gain more than 2lbs in a day or 5lbs in a week.  Limit your salt intake to 1500-2000mg  per day or 500mg  of Sodium per meal.  Check your blood pressure daily for 1 week, then contact the office with your readings.  Contact the office  either by phone or MyChart with your readings.  Make sure to check your blood pressure 2 hours after taking your medications.   AVOID these things for 30 minutes before checking your blood pressure: No Drinking caffeine. No Drinking alcohol. No Eating. No Smoking. No Exercising.  Five minutes before checking your blood pressure: Pee. Sit in a dining chair. Avoid sitting in a soft couch or armchair. Be quiet. Do not talk.

## 2023-07-15 ENCOUNTER — Ambulatory Visit (INDEPENDENT_AMBULATORY_CARE_PROVIDER_SITE_OTHER): Admitting: Orthopaedic Surgery

## 2023-07-15 ENCOUNTER — Encounter: Payer: Self-pay | Admitting: Orthopaedic Surgery

## 2023-07-15 DIAGNOSIS — M71061 Abscess of bursa, right knee: Secondary | ICD-10-CM

## 2023-07-15 NOTE — Progress Notes (Signed)
   Post-Op Visit Note   Patient: Roberta Soto           Date of Birth: 17-Mar-1980           MRN: [id number] Visit Date: 07/15/2023 PCP: Patient, No Pcp Per   Assessment & Plan:  Chief Complaint:  Chief Complaint  Patient presents with   Right Knee - Follow-up    I&D 06/20/2023   Visit Diagnoses:  1. Abscess of bursa of right knee     Plan: History of Present Illness Roberta Soto is a 43 year old female who presents for follow-up after knee bursa infection surgery.  She underwent surgery on Jun 20, 2023, to clean out an infected right knee bursa. There is a significant reduction in swelling and tenderness in her leg. Tenderness persists when scratching certain areas around the knee. Knee flexibility has improved, though bending remains slightly tender, possibly due to stitches. She is able to walk on the leg.  Physical Exam MUSCULOSKELETAL: Right knee much less swollen, softer, not tender. Slight tenderness in certain spots.  No cellulitis.  Painless range of motion.  Assessment and Plan Status post I&D of infected right knee Post-operative improvement noted with reduced swelling and tenderness. Infection improving. - Remove stitches today. - No further follow-up required.  Follow-Up Instructions: No follow-ups on file.   Orders:  No orders of the defined types were placed in this encounter.  No orders of the defined types were placed in this encounter.   Imaging: No results found.  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   Chronic health problem 06/19/2023   AMS (altered mental status) 06/19/2023   Past Medical History:  Diagnosis Date   Asthma    Hypertension     Family History  Problem Relation Age of Onset   Cancer Mother    Hypertension Mother    Cancer Brother    Healthy Father     Past Surgical History:  Procedure Laterality Date   c-section     X4   INCISION AND DRAINAGE OF WOUND Right 06/20/2023   Procedure: IRRIGATION AND DEBRIDEMENT RIGHT KNEE;  Surgeon: Wes Hamman, MD;  Location: [facility] OR;  Service: Orthopedics;  Laterality: Right;   Social History   Occupational History   Occupation: Not working  Tobacco Use   Smoking status: Former    Types: Cigarettes    Start date: 2024   Smokeless tobacco: Never  Vaping Use   Vaping status: Former  Substance and Sexual Activity   Alcohol use: No   Drug use: Never   Sexual activity: Not on file

## 2023-07-18 ENCOUNTER — Encounter (HOSPITAL_COMMUNITY): Payer: Self-pay

## 2023-07-23 ENCOUNTER — Other Ambulatory Visit (HOSPITAL_COMMUNITY): Payer: Self-pay

## 2023-07-30 ENCOUNTER — Encounter (HOSPITAL_COMMUNITY): Payer: Self-pay

## 2023-07-31 ENCOUNTER — Ambulatory Visit (HOSPITAL_COMMUNITY)
Admission: RE | Admit: 2023-07-31 | Discharge: 2023-07-31 | Disposition: A | Discharge status: Home / Self Care | Source: Ambulatory Visit | Attending: Family Medicine | Admitting: Family Medicine

## 2023-07-31 ENCOUNTER — Other Ambulatory Visit (HOSPITAL_COMMUNITY): Payer: Self-pay | Admitting: Family Medicine

## 2023-07-31 DIAGNOSIS — I5022 Chronic systolic (congestive) heart failure: Secondary | ICD-10-CM | POA: Diagnosis present

## 2023-07-31 MED ORDER — GADOBUTROL 1 MMOL/ML IV SOLN: 10.0000 mL | Freq: Once | INTRAVENOUS | Status: DC | PRN

## 2023-07-31 MED ORDER — GADOBUTROL 1 MMOL/ML IV SOLN
10.0000 mL | Freq: Once | INTRAVENOUS | Status: AC | PRN
  Administered 2023-07-31: 10 mL via INTRAVENOUS

## 2023-07-31 NOTE — Progress Notes (Signed)
  Device system confirmed to be MRI conditional, with implant date > 6 weeks ago, and no evidence of abandoned or epicardial leads in review of most recent CXR  Device last cleared by EP Provider: Valeri Gate, PA 06/12/2023  Clearance is good through for 1 year as long as parameters remain stable at time of check. If pt undergoes a cardiac device procedure during that time, they should be re-cleared.   Tachy-therapies to be programmed off if applicable with device back to pre-MRI settings after completion of exam.  [employer] was available remotely to assist in programming recommendations.   Roberta Soto  07/31/2023 10:40 AM

## 2023-08-11 ENCOUNTER — Ambulatory Visit (HOSPITAL_COMMUNITY): Payer: Self-pay | Admitting: Family Medicine

## 2023-08-12 NOTE — Telephone Encounter (Signed)
Pt aware.

## 2023-08-18 ENCOUNTER — Other Ambulatory Visit (HOSPITAL_COMMUNITY)

## 2023-08-27 ENCOUNTER — Telehealth (HOSPITAL_COMMUNITY): Payer: Self-pay | Admitting: Internal Medicine

## 2023-08-27 NOTE — Telephone Encounter (Signed)
 Called to confirm/remind patient of their appointment at the [facility] on 08/27/23.   Appointment:   [x] Confirmed  [] Left mess   [] No answer/No voice mail  [] VM Full/unable to leave message  [] Phone not in service  Patient reminded to bring all medications and/or complete list.  Confirmed patient has transportation. Gave directions, instructed to utilize valet parking.

## 2023-08-28 ENCOUNTER — Encounter (HOSPITAL_COMMUNITY): Admitting: Internal Medicine

## 2023-08-29 ENCOUNTER — Other Ambulatory Visit: Payer: Self-pay | Admitting: Nurse Practitioner

## 2023-09-02 ENCOUNTER — Encounter (HOSPITAL_COMMUNITY): Payer: Self-pay

## 2023-09-02 ENCOUNTER — Ambulatory Visit (HOSPITAL_COMMUNITY): Payer: Self-pay | Admitting: Adult Health

## 2023-09-02 ENCOUNTER — Inpatient Hospital Stay (HOSPITAL_COMMUNITY)
Admission: RE | Admit: 2023-09-02 | Discharge: 2023-09-02 | Discharge status: Home / Self Care | Source: Ambulatory Visit | Attending: Adult Health | Admitting: Adult Health

## 2023-09-02 VITALS — BP 132/86 | HR 105 | Wt 249.0 lb

## 2023-09-02 DIAGNOSIS — F419 Anxiety disorder, unspecified: Secondary | ICD-10-CM | POA: Diagnosis not present

## 2023-09-02 DIAGNOSIS — Z5901 Sheltered homelessness: Secondary | ICD-10-CM | POA: Insufficient documentation

## 2023-09-02 DIAGNOSIS — Z789 Other specified health status: Secondary | ICD-10-CM

## 2023-09-02 DIAGNOSIS — Z87891 Personal history of nicotine dependence: Secondary | ICD-10-CM | POA: Insufficient documentation

## 2023-09-02 DIAGNOSIS — I428 Other cardiomyopathies: Secondary | ICD-10-CM | POA: Diagnosis not present

## 2023-09-02 DIAGNOSIS — I5022 Chronic systolic (congestive) heart failure: Secondary | ICD-10-CM | POA: Insufficient documentation

## 2023-09-02 DIAGNOSIS — I110 Hypertensive heart disease with heart failure: Secondary | ICD-10-CM | POA: Insufficient documentation

## 2023-09-02 DIAGNOSIS — Z87440 Personal history of urinary (tract) infections: Secondary | ICD-10-CM | POA: Insufficient documentation

## 2023-09-02 DIAGNOSIS — Z720 Tobacco use: Secondary | ICD-10-CM

## 2023-09-02 DIAGNOSIS — M1990 Unspecified osteoarthritis, unspecified site: Secondary | ICD-10-CM | POA: Insufficient documentation

## 2023-09-02 DIAGNOSIS — Z79899 Other long term (current) drug therapy: Secondary | ICD-10-CM | POA: Insufficient documentation

## 2023-09-02 DIAGNOSIS — J45909 Unspecified asthma, uncomplicated: Secondary | ICD-10-CM | POA: Insufficient documentation

## 2023-09-02 DIAGNOSIS — I10 Essential (primary) hypertension: Secondary | ICD-10-CM

## 2023-09-02 MED ORDER — SACUBITRIL-VALSARTAN 49-51 MG PO TABS: 1.0000 | ORAL_TABLET | Freq: Two times a day (BID) | ORAL | 8 refills | Status: AC

## 2023-09-02 NOTE — Patient Instructions (Addendum)
 Good to see you today!  INCREASE Entresto  to 49/51 mg Twice daily  Lab work in 1 week as scheduled  Please follow up with our heart failure pharmacist in as scheduled  Your physician recommends that you schedule a follow-up appointment in: as scheduled  If you have any questions or concerns before your next appointment please send us  a message through [place] or call our office at [phone].    TO LEAVE A MESSAGE FOR THE NURSE SELECT OPTION 2, PLEASE LEAVE A MESSAGE INCLUDING: YOUR NAME DATE OF BIRTH CALL BACK NUMBER REASON FOR CALL**this is important as we prioritize the call backs  YOU WILL RECEIVE A CALL BACK THE SAME DAY AS LONG AS YOU CALL BEFORE 4:00 PM At the [facility], you and your health needs are our priority. As part of our continuing mission to provide you with exceptional heart care, we have created designated Provider Care Teams. These Care Teams include your primary Cardiologist (physician) and Advanced Practice Providers (APPs- Physician Assistants and Nurse Practitioners) who all work together to provide you with the care you need, when you need it.   You may see any of the following providers on your designated Care Team at your next follow up: Dr Toribio Fuel Dr Ezra Shuck Dr. Ria Commander Dr. Morene Brownie Amy Lenetta, NP Caffie Shed, [place] [facility] [place], [place] Beckey Coe, NP [place] Lee, NP Ellouise Class, NP Tinnie Redman, PharmD Jaun Bash, PharmD   Please be sure to bring in all your medications bottles to every appointment.    Thank you for choosing [facility]

## 2023-09-02 NOTE — Progress Notes (Signed)
 [facility]    PCP: Dic[facility] Re[facility] Primary Cardiologist: Dr Cherrie   Chief Complaint: Heart Failure   HPI: Roberta Soto is a 43 y.o. female with a past medical history of asthma, arthritis, NICM, anxiety, and HFrEF.    Admitted 5/25 with sepsis 2/2 to R leg cellulitis/knee bursitis. Ortho consulted, and taken to the OR for exploration. BCx + for strep pyogenes.  Echo showed EF 30-35%, RV normal, mild MR, no vegetations. ID consulted for abx, no indication for TEE. Cards consulted, felt reduced EF 2/2 sepsis. GDMT titrated and she was discharged home, weight 299 lbs.  Today she returns for HF follow up.Overall feeling fine. Feels terrible when she is outside and says it makers her feel itchy. Occasionally short of breath. Denies PND/Orthopnea. Appetite ok. No fever or chills. Taking all medications. Smokes 1 pack of cigarettes every 3 days. Lives in a motel. She has a hard time paying for  medications (co pay). Gets help from family and friends. Husband incarcerated.   Cardiac Test  Echo 5/25: EF 30-35%, RV normal, mild MR, no vegetation.  CMRI 07/2023, LVEF 30% RVEF 33%. No scar, infarct, or infiltration.   ROS: All systems negative except as listed in HPI, PMH and Problem List.  SH:  Social History   Socioeconomic History   Marital status: Single    Spouse name: Not on file   Number of children: 3   Years of education: Not on file   Highest education level: High school graduate  Occupational History   Occupation: Not working  Tobacco Use   Smoking status: Former    Types: Cigarettes    Start date: 2024   Smokeless tobacco: Never  Vaping Use   Vaping status: Former  Substance and Sexual Activity   Alcohol use: No   Drug use: Never   Sexual activity: Not on file  Other Topics Concern   Not on file  Social History Narrative   Not on file   Social Drivers of Health   Financial Resource Strain: Low Risk  (06/27/2023)   Overall Financial  Resource Strain (CARDIA)    Difficulty of Paying Living Expenses: Not very hard  Food Insecurity: No Food Insecurity (06/24/2023)   Hunger Vital Sign    Worried About Running Out of Food in the Last Year: Never true    Ran Out of Food in the Last Year: Never true  Transportation Needs: No Transportation Needs (06/27/2023)   PRAPARE - Administrator, Civil Service (Medical): No    Lack of Transportation (Non-Medical): No  Physical Activity: Not on file  Stress: Not on file  Social Connections: Not on file  Intimate Partner Violence: Not At Risk (06/25/2023)   Humiliation, Afraid, Rape, and Kick questionnaire    Fear of Current or Ex-Partner: No    Emotionally Abused: No    Physically Abused: No    Sexually Abused: No    FH:  Family History  Problem Relation Age of Onset   Cancer Mother    Hypertension Mother    Cancer Brother    Healthy Father     Past Medical History:  Diagnosis Date   Asthma    Hypertension     Current Outpatient Medications  Medication Sig Dispense Refill   albuterol  (PROVENTIL ) (2.5 MG/3ML) 0.083% nebulizer solution Take 3 mLs (2.5 mg total) by nebulization every 6 (six) hours as needed for wheezing or shortness of breath. 75 mL 1   albuterol  (  VENTOLIN  HFA) 108 (90 Base) MCG/ACT inhaler Inhale 2 puffs into the lungs every 4 (four) hours as needed for wheezing or shortness of breath. 8 g 0   budesonide -formoterol  (SYMBICORT ) 160-4.5 MCG/ACT inhaler Inhale 2 puffs into the lungs 2 (two) times daily. 1 Inhaler 12   clonazePAM  (KLONOPIN ) 1 MG tablet Take 1 mg by mouth 3 (three) times daily.     [facility] MAGNESIUM  OXIDE 250 MG TABS Take 1 tablet by mouth daily.     cyanocobalamin  1000 MCG tablet Take 1 tablet (1,000 mcg total) by mouth daily.     cyclobenzaprine  (FLEXERIL ) 10 MG tablet Take 1 tablet (10 mg total) by mouth 3 (three) times daily as needed for muscle spasms. 30 tablet 0   escitalopram  (LEXAPRO ) 10 MG tablet Take 10 mg by mouth daily.      ferrous sulfate  325 (65 FE) MG EC tablet Take 1 tablet (325 mg total) by mouth daily. 60 tablet 3   furosemide  (LASIX ) 20 MG tablet Take 1 tablet (20 mg total) by mouth as needed.     gabapentin  (NEURONTIN ) 400 MG capsule Take 400 mg by mouth 3 (three) times daily.     hydrOXYzine (VISTARIL) 50 MG capsule Take 50 mg by mouth 3 (three) times daily.     ipratropium-albuterol  (DUONEB) 0.5-2.5 (3) MG/3ML SOLN Take 3 mLs by nebulization every 4 (four) hours as needed. 360 mL 3   levocetirizine (XYZAL ) 5 MG tablet Take 1 tablet (5 mg total) by mouth every evening. 90 tablet 3   metoprolol  succinate (TOPROL -XL) 25 MG 24 hr tablet Take 1 tablet (25 mg total) by mouth daily. 90 tablet 2   oxyCODONE  (ROXICODONE ) 5 MG immediate release tablet Take 1 tablet (5 mg total) by mouth every 6 (six) hours as needed for severe pain (pain score 7-10). 20 tablet 0   sacubitril -valsartan  (ENTRESTO ) 24-26 MG Take 1 tablet by mouth 2 (two) times daily. 60 tablet 11   spironolactone  (ALDACTONE ) 25 MG tablet Take 1 tablet (25 mg total) by mouth daily. 60 tablet 3   varenicline  (CHANTIX ) 0.5 MG tablet Take 1 tablet (0.5 mg total) by mouth 2 (two) times daily. 60 tablet 1   No current facility-administered medications for this encounter.    Vitals:   09/02/23 1546  BP: 132/86  Pulse: (!) 105  SpO2: 98%  Weight: 112.9 kg (249 lb)   Wt Readings from Last 3 Encounters:  09/02/23 112.9 kg (249 lb)  07/11/23 114.3 kg (252 lb)  07/08/23 114.7 kg (252 lb 12.8 oz)    PHYSICAL EXAM: General:   No resp difficulty Neck: no JVD.  Cor: Regular rate & rhythm. Lungs: clear Abdomen: soft, nontender, nondistended.  Extremities: no  edema Neuro: alert & oriented x3    ZRH:Dpwld Tach 100 bpm QRS 94 ms    ASSESSMENT & PLAN: Chronic Systolic Heart Failure -- Echo 5/25: EF 30-35%, RV ok -Suspect NICM. She has not had ischemic work up. Plan to optimize GDMT and repeat ECHO 3-4 months.  - NYHA II-III . Appears euvolemic  -  Increase entresto  49-51 mg twice a day. Check BMET in 7 days.  - Continue spiro 25 mg daily mg daily. - Continue Toprol  XL 25 mg daily. - Has had UTIs in recent past, hold off on SGLT2i for now. - Discussed fetotoxicity of meds, she is s/p tubal ligation.   2. HTN Increasing entresto  as noted above.    3. Tobacco use Smoking a pack every 3 days.  Refer to SW for assistance with housing and medications.   Follow up 3 weeks with pharmacy and 6 weeks with Dr Bensimhon.  Lenee Franze NP-C  9:56 AM

## 2023-09-03 ENCOUNTER — Other Ambulatory Visit: Payer: Self-pay | Admitting: Medical Genetics

## 2023-09-09 ENCOUNTER — Other Ambulatory Visit (HOSPITAL_COMMUNITY)

## 2023-09-11 ENCOUNTER — Other Ambulatory Visit

## 2023-09-26 ENCOUNTER — Ambulatory Visit: Attending: Cardiology | Admitting: Cardiology

## 2023-09-28 NOTE — Progress Notes (Incomplete)
***  In Progress***    [facility] Note   PCP: [facility] Primary Cardiologist: Dr Cherrie  HPI:  Roberta Soto is a 43 y.o. female with a past medical history of asthma, arthritis, NICM, anxiety, and HFrEF.    Admitted 06/2023 with sepsis 2/2 to R leg cellulitis/knee bursitis. Ortho consulted, and taken to the OR for exploration. BCx + for strep pyogenes.  Echo showed EF 30-35%, RV normal, mild MR, no vegetations. ID consulted for abx, no indication for TEE. Cards consulted, felt reduced EF 2/2 sepsis. GDMT titrated and she was discharged home.   On 09/02/23, she returned for HF follow up with APP. She reported feeling terrible when she was outside, said it made her feel itchy. Reported occasionally being short of breath. Denied PND/Orthopnea. Smoked 1 pack of cigarettes every 3 days. Lived in a motel. She reported difficulty paying her medication co-pays. Reported taking all medications. Family and friends helped pay for her medications. Noted husband incarcerated. ***  Today she returns to HF clinic for pharmacist medication titration. At last visit with APP, Entresto  was increased to 49/51 mg BID *** SGLT2 was not added due to recent UTIs***.   Shortness of breath/dyspnea on exertion? {YES NO:22349}  Orthopnea/PND? {YES NO:22349} Edema? {YES NO:22349} Lightheadedness/dizziness? {YES NO:22349} Daily weights at home? {YES NO:22349} Blood pressure/heart rate monitoring at home? {YES [id number] Following low-sodium/fluid-restricted diet? {YES NO:22349}  HF Medications: Metoprolol  succinate 25 mg daily *** Entresto  49/51 mg BID *** (+ Losartan  25 mg daily ?***) Spironolactone  25 mg daily *** (Filled Furosemide  20 mg daily ***)   Has the patient been experiencing any side effects to the medications prescribed?  {YES NO:22349}  Does the patient have any problems obtaining medications due to transportation or finances?   {YES NO:22349}  Understanding of  regimen: {excellent/good/fair/poor:19665} Understanding of indications: {excellent/good/fair/poor:19665} Potential of compliance: {excellent/good/fair/poor:19665} Patient understands to avoid NSAIDs. Patient understands to avoid decongestants.    Pertinent Lab Values:(07/08/23) Serum creatinine 1.19 mg/dL, BUN 5 mg/dL, Potassium 3.7 mmol/L, sodium 141 mmol/L, BNP 759.5 pg/mL, Magnesium  1.9 mg/dL (4/87/74)   Vital Signs: Weight: *** (last clinic weight: 249 lbs) Blood pressure: *** (last clinic BP: 132/86 mmHg) Heart rate: *** (last clinic HR: 105 bpm)  Assessment/Plan: Chronic Systolic Heart Failure -- Echo 06/2023: EF 30-35%, RV ok - CMR in 07/2023 consistent with NICM. She has not had ischemic work up. Plan to optimize GDMT and repeat ECHO 3-4 months.  - NYHA II-III . Appears euvolemic *** - Increase entresto  49-51 mg twice a day. *** Check BMET in 7 days. *** - Continue spiro 25 mg daily mg daily. *** - Continue Toprol  XL 25 mg daily. *** - Has had UTIs in recent past, hold off on SGLT2i for now. *** - Discussed fetotoxicity of meds, she is s/p tubal ligation.    2. HTN Increasing Entresto  as noted above. **   3. Tobacco use Smoking a pack every 3 days. ***  Follow up ***  Morna Breach, PharmD PGY2 Cardiology Pharmacy Resident

## 2023-09-29 ENCOUNTER — Inpatient Hospital Stay (HOSPITAL_COMMUNITY)
Admission: RE | Admit: 2023-09-29 | Discharge: 2023-09-29 | Disposition: A | Discharge status: Home / Self Care | Source: Ambulatory Visit

## 2023-10-29 ENCOUNTER — Encounter (HOSPITAL_COMMUNITY): Admitting: Internal Medicine

## 2023-10-30 ENCOUNTER — Encounter (HOSPITAL_COMMUNITY): Admitting: Internal Medicine

## 2023-11-01 NOTE — Addendum Note (Signed)
 Encounter addended by: Milliana Reddoch R, [facility] on: 11/01/2023 6:30 PM  Actions taken: Delete clinical note

## 2023-11-21 ENCOUNTER — Other Ambulatory Visit: Payer: Self-pay | Admitting: *Deleted

## 2023-11-21 DIAGNOSIS — Z006 Encounter for examination for normal comparison and control in clinical research program: Secondary | ICD-10-CM

## 2023-12-15 ENCOUNTER — Encounter: Payer: Self-pay | Admitting: Radiology

## 2024-02-21 ENCOUNTER — Ambulatory Visit (HOSPITAL_COMMUNITY)
